# Patient Record
Sex: Female | Born: 1980 | Race: Black or African American | Hispanic: No | Marital: Married | Smoking: Never smoker
Health system: Southern US, Community
[De-identification: ages and names within clinical notes are randomized; demographics above are authoritative.]

## PROBLEM LIST (undated history)

## (undated) ENCOUNTER — Inpatient Hospital Stay (HOSPITAL_COMMUNITY): Payer: Self-pay

## (undated) DIAGNOSIS — N3010 Interstitial cystitis (chronic) without hematuria: Secondary | ICD-10-CM

## (undated) DIAGNOSIS — N390 Urinary tract infection, site not specified: Secondary | ICD-10-CM

## (undated) DIAGNOSIS — F419 Anxiety disorder, unspecified: Secondary | ICD-10-CM

## (undated) DIAGNOSIS — M797 Fibromyalgia: Secondary | ICD-10-CM

## (undated) DIAGNOSIS — G709 Myoneural disorder, unspecified: Secondary | ICD-10-CM

## (undated) DIAGNOSIS — N801 Endometriosis of ovary: Secondary | ICD-10-CM

## (undated) DIAGNOSIS — F329 Major depressive disorder, single episode, unspecified: Secondary | ICD-10-CM

## (undated) DIAGNOSIS — A599 Trichomoniasis, unspecified: Secondary | ICD-10-CM

## (undated) DIAGNOSIS — N739 Female pelvic inflammatory disease, unspecified: Secondary | ICD-10-CM

## (undated) DIAGNOSIS — K2970 Gastritis, unspecified, without bleeding: Secondary | ICD-10-CM

## (undated) DIAGNOSIS — N80109 Endometriosis of ovary, unspecified side, unspecified depth: Secondary | ICD-10-CM

## (undated) DIAGNOSIS — K219 Gastro-esophageal reflux disease without esophagitis: Secondary | ICD-10-CM

## (undated) DIAGNOSIS — F410 Panic disorder [episodic paroxysmal anxiety] without agoraphobia: Secondary | ICD-10-CM

## (undated) DIAGNOSIS — M549 Dorsalgia, unspecified: Secondary | ICD-10-CM

## (undated) DIAGNOSIS — K449 Diaphragmatic hernia without obstruction or gangrene: Secondary | ICD-10-CM

## (undated) DIAGNOSIS — F32A Depression, unspecified: Secondary | ICD-10-CM

## (undated) DIAGNOSIS — D219 Benign neoplasm of connective and other soft tissue, unspecified: Secondary | ICD-10-CM

## (undated) HISTORY — DX: Gastro-esophageal reflux disease without esophagitis: K21.9

## (undated) HISTORY — DX: Gastritis, unspecified, without bleeding: K29.70

## (undated) HISTORY — DX: Fibromyalgia: M79.7

## (undated) HISTORY — DX: Interstitial cystitis (chronic) without hematuria: N30.10

## (undated) HISTORY — DX: Myoneural disorder, unspecified: G70.9

## (undated) HISTORY — DX: Panic disorder (episodic paroxysmal anxiety): F41.0

## (undated) HISTORY — DX: Dorsalgia, unspecified: M54.9

## (undated) HISTORY — DX: Benign neoplasm of connective and other soft tissue, unspecified: D21.9

---

## 2008-12-07 ENCOUNTER — Emergency Department (HOSPITAL_COMMUNITY): Admission: EM | Admit: 2008-12-07 | Discharge: 2008-12-07 | Payer: Self-pay | Admitting: Emergency Medicine

## 2009-03-12 ENCOUNTER — Emergency Department (HOSPITAL_COMMUNITY): Admission: EM | Admit: 2009-03-12 | Discharge: 2009-03-12 | Payer: Self-pay | Admitting: Emergency Medicine

## 2009-09-17 ENCOUNTER — Emergency Department (HOSPITAL_COMMUNITY): Admission: EM | Admit: 2009-09-17 | Discharge: 2009-09-18 | Payer: Self-pay | Admitting: Emergency Medicine

## 2009-11-18 ENCOUNTER — Emergency Department (HOSPITAL_COMMUNITY): Admission: EM | Admit: 2009-11-18 | Discharge: 2009-11-18 | Payer: Self-pay | Admitting: Emergency Medicine

## 2010-03-10 ENCOUNTER — Emergency Department (HOSPITAL_COMMUNITY): Admission: EM | Admit: 2010-03-10 | Discharge: 2010-03-11 | Payer: Self-pay | Admitting: Emergency Medicine

## 2010-07-28 ENCOUNTER — Emergency Department (HOSPITAL_COMMUNITY): Admission: EM | Admit: 2010-07-28 | Discharge: 2010-07-28 | Payer: Self-pay | Admitting: Emergency Medicine

## 2010-11-07 ENCOUNTER — Emergency Department (HOSPITAL_COMMUNITY): Payer: Self-pay

## 2010-11-07 ENCOUNTER — Emergency Department (HOSPITAL_COMMUNITY)
Admission: EM | Admit: 2010-11-07 | Discharge: 2010-11-07 | Disposition: A | Discharge status: Home / Self Care | Payer: Self-pay | Attending: Emergency Medicine | Admitting: Emergency Medicine

## 2010-11-07 DIAGNOSIS — R109 Unspecified abdominal pain: Secondary | ICD-10-CM | POA: Insufficient documentation

## 2010-11-07 DIAGNOSIS — F3289 Other specified depressive episodes: Secondary | ICD-10-CM | POA: Insufficient documentation

## 2010-11-07 DIAGNOSIS — R5381 Other malaise: Secondary | ICD-10-CM | POA: Insufficient documentation

## 2010-11-07 DIAGNOSIS — F329 Major depressive disorder, single episode, unspecified: Secondary | ICD-10-CM | POA: Insufficient documentation

## 2010-11-07 LAB — HEPATIC FUNCTION PANEL
ALT: 15 U/L (ref 0–35)
Albumin: 4.2 g/dL (ref 3.5–5.2)
Alkaline Phosphatase: 53 U/L (ref 39–117)
Total Protein: 7.7 g/dL (ref 6.0–8.3)

## 2010-11-07 LAB — CBC
HCT: 38 % (ref 36.0–46.0)
Hemoglobin: 12.7 g/dL (ref 12.0–15.0)
MCH: 29 pg (ref 26.0–34.0)
MCHC: 33.4 g/dL (ref 30.0–36.0)
MCV: 86.8 fL (ref 78.0–100.0)
Platelets: 251 10*3/uL (ref 150–400)
RBC: 4.38 MIL/uL (ref 3.87–5.11)
RDW: 13.1 % (ref 11.5–15.5)
WBC: 3.3 10*3/uL — ABNORMAL LOW (ref 4.0–10.5)

## 2010-11-07 LAB — URINALYSIS, ROUTINE W REFLEX MICROSCOPIC
Bilirubin Urine: NEGATIVE
Glucose, UA: NEGATIVE mg/dL
Hgb urine dipstick: NEGATIVE
Ketones, ur: NEGATIVE mg/dL
Nitrite: NEGATIVE
Protein, ur: NEGATIVE mg/dL
Specific Gravity, Urine: 1.013 (ref 1.005–1.030)
Urobilinogen, UA: 0.2 mg/dL (ref 0.0–1.0)
pH: 6 (ref 5.0–8.0)

## 2010-11-07 LAB — BASIC METABOLIC PANEL
Chloride: 100 mEq/L (ref 96–112)
GFR calc Af Amer: >60 mL/min (ref >60)
GFR calc non Af Amer: >60 mL/min (ref >60)
Potassium: 3.8 mEq/L (ref 3.5–5.1)

## 2010-11-07 LAB — LIPASE, BLOOD: Lipase: 33 U/L (ref 11–59)

## 2010-11-07 LAB — DIFFERENTIAL
Basophils Absolute: 0 10*3/uL (ref 0.0–0.1)
Basophils Relative: 0 % (ref 0–1)
Eosinophils Absolute: 0 K/uL (ref 0.0–0.7)
Eosinophils Relative: 1 % (ref 0–5)
Lymphocytes Relative: 49 % — ABNORMAL HIGH (ref 12–46)
Lymphs Abs: 1.6 K/uL (ref 0.7–4.0)
Monocytes Absolute: 0.3 K/uL (ref 0.1–1.0)
Monocytes Relative: 8 % (ref 3–12)
Neutro Abs: 1.4 10*3/uL — ABNORMAL LOW (ref 1.7–7.7)
Neutrophils Relative %: 42 % — ABNORMAL LOW (ref 43–77)

## 2010-11-07 LAB — BASIC METABOLIC PANEL WITH GFR
BUN: 8 mg/dL (ref 6–23)
CO2: 29 meq/L (ref 19–32)
Calcium: 9.6 mg/dL (ref 8.4–10.5)
Creatinine, Ser: 0.71 mg/dL (ref 0.4–1.2)
Glucose, Bld: 94 mg/dL (ref 70–99)
Sodium: 136 meq/L (ref 135–145)

## 2010-11-09 ENCOUNTER — Inpatient Hospital Stay (INDEPENDENT_AMBULATORY_CARE_PROVIDER_SITE_OTHER)
Admission: RE | Admit: 2010-11-09 | Discharge: 2010-11-09 | Disposition: A | Discharge status: Home / Self Care | Payer: Self-pay | Source: Ambulatory Visit | Attending: Family Medicine | Admitting: Family Medicine

## 2010-11-09 DIAGNOSIS — R109 Unspecified abdominal pain: Secondary | ICD-10-CM

## 2010-11-09 DIAGNOSIS — K219 Gastro-esophageal reflux disease without esophagitis: Secondary | ICD-10-CM

## 2010-11-14 LAB — POCT PREGNANCY, URINE: Preg Test, Ur: NEGATIVE

## 2010-11-19 LAB — CBC
HCT: 31.1 % — ABNORMAL LOW (ref 36.0–46.0)
Hemoglobin: 13.3 g/dL (ref 12.0–15.0)
Hemoglobin: 10.1 g/dL — ABNORMAL LOW (ref 12.0–15.0)
MCH: 30.7 pg (ref 26.0–34.0)
MCHC: 34.1 g/dL (ref 30.0–36.0)
MCV: 80.1 fL (ref 78.0–100.0)
Platelets: 242 10*3/uL (ref 150–400)
RDW: 13.9 % (ref 11.5–15.5)
RDW: 16.7 % — ABNORMAL HIGH (ref 11.5–15.5)

## 2010-11-19 LAB — URINALYSIS, ROUTINE W REFLEX MICROSCOPIC
Glucose, UA: NEGATIVE mg/dL
Glucose, UA: NEGATIVE mg/dL
Ketones, ur: NEGATIVE mg/dL
Nitrite: NEGATIVE
Protein, ur: NEGATIVE mg/dL
Specific Gravity, Urine: 1.025 (ref 1.005–1.030)
Urobilinogen, UA: 0.2 mg/dL (ref 0.0–1.0)
pH: 6.5 (ref 5.0–8.0)

## 2010-11-19 LAB — DIFFERENTIAL
Basophils Absolute: 0 10*3/uL (ref 0.0–0.1)
Eosinophils Absolute: 0.1 10*3/uL (ref 0.0–0.7)
Eosinophils Absolute: 0 10*3/uL (ref 0.0–0.7)
Eosinophils Relative: 1 % (ref 0–5)
Eosinophils Relative: 1 % (ref 0–5)
Lymphocytes Relative: 47 % — ABNORMAL HIGH (ref 12–46)
Lymphocytes Relative: 54 % — ABNORMAL HIGH (ref 12–46)
Lymphs Abs: 2 10*3/uL (ref 0.7–4.0)
Monocytes Absolute: 0.4 10*3/uL (ref 0.1–1.0)
Monocytes Absolute: 0.3 10*3/uL (ref 0.1–1.0)
Monocytes Relative: 9 % (ref 3–12)

## 2010-11-19 LAB — URINE MICROSCOPIC-ADD ON

## 2010-11-19 LAB — POCT PREGNANCY, URINE: Preg Test, Ur: NEGATIVE

## 2010-11-19 LAB — COMPREHENSIVE METABOLIC PANEL
ALT: 18 U/L (ref 0–35)
AST: 19 U/L (ref 0–37)
Albumin: 4 g/dL (ref 3.5–5.2)
Calcium: 8.8 mg/dL (ref 8.4–10.5)
Creatinine, Ser: 0.71 mg/dL (ref 0.4–1.2)
GFR calc Af Amer: >60 mL/min (ref >60)
GFR calc non Af Amer: >60 mL/min (ref >60)
Sodium: 135 mEq/L (ref 135–145)
Total Protein: 7.4 g/dL (ref 6.0–8.3)

## 2010-11-19 LAB — BASIC METABOLIC PANEL
Calcium: 8.7 mg/dL (ref 8.4–10.5)
GFR calc Af Amer: >60 mL/min (ref >60)
GFR calc non Af Amer: >60 mL/min (ref >60)
Glucose, Bld: 94 mg/dL (ref 70–99)
Sodium: 134 mEq/L — ABNORMAL LOW (ref 135–145)

## 2010-11-19 LAB — LIPASE, BLOOD: Lipase: 35 U/L (ref 11–59)

## 2010-12-10 LAB — URINALYSIS, ROUTINE W REFLEX MICROSCOPIC
Ketones, ur: 15 mg/dL — AB
Nitrite: NEGATIVE
Protein, ur: NEGATIVE mg/dL

## 2010-12-10 LAB — GC/CHLAMYDIA PROBE AMP, GENITAL: GC Probe Amp, Genital: NEGATIVE

## 2010-12-10 LAB — URINE CULTURE
Colony Count: NO GROWTH
Culture: NO GROWTH

## 2010-12-10 LAB — DIFFERENTIAL
Basophils Absolute: 0 10*3/uL (ref 0.0–0.1)
Eosinophils Absolute: 0 10*3/uL (ref 0.0–0.7)
Eosinophils Relative: 1 % (ref 0–5)
Lymphocytes Relative: 32 % (ref 12–46)

## 2010-12-10 LAB — CBC
HCT: 30.8 % — ABNORMAL LOW (ref 36.0–46.0)
MCV: 78.1 fL (ref 78.0–100.0)
Platelets: 288 10*3/uL (ref 150–400)
RDW: 16.6 % — ABNORMAL HIGH (ref 11.5–15.5)

## 2010-12-10 LAB — PREGNANCY, URINE: Preg Test, Ur: NEGATIVE

## 2010-12-10 LAB — RPR: RPR Ser Ql: NONREACTIVE

## 2010-12-10 LAB — WET PREP, GENITAL: Yeast Wet Prep HPF POC: NONE SEEN

## 2010-12-13 LAB — URINE MICROSCOPIC-ADD ON

## 2010-12-13 LAB — URINALYSIS, ROUTINE W REFLEX MICROSCOPIC
Bilirubin Urine: NEGATIVE
Nitrite: NEGATIVE
Specific Gravity, Urine: 1.008 (ref 1.005–1.030)
pH: 7.5 (ref 5.0–8.0)

## 2011-01-15 ENCOUNTER — Emergency Department (HOSPITAL_COMMUNITY)
Admission: EM | Admit: 2011-01-15 | Discharge: 2011-01-15 | Disposition: A | Discharge status: Home / Self Care | Payer: Self-pay | Attending: Emergency Medicine | Admitting: Emergency Medicine

## 2011-01-15 ENCOUNTER — Emergency Department (HOSPITAL_COMMUNITY): Payer: Self-pay

## 2011-01-15 DIAGNOSIS — H538 Other visual disturbances: Secondary | ICD-10-CM | POA: Insufficient documentation

## 2011-01-15 DIAGNOSIS — R209 Unspecified disturbances of skin sensation: Secondary | ICD-10-CM | POA: Insufficient documentation

## 2011-01-15 DIAGNOSIS — IMO0001 Reserved for inherently not codable concepts without codable children: Secondary | ICD-10-CM | POA: Insufficient documentation

## 2011-01-15 DIAGNOSIS — F329 Major depressive disorder, single episode, unspecified: Secondary | ICD-10-CM | POA: Insufficient documentation

## 2011-01-15 DIAGNOSIS — R29898 Other symptoms and signs involving the musculoskeletal system: Secondary | ICD-10-CM | POA: Insufficient documentation

## 2011-01-15 DIAGNOSIS — R5383 Other fatigue: Secondary | ICD-10-CM | POA: Insufficient documentation

## 2011-01-15 DIAGNOSIS — F3289 Other specified depressive episodes: Secondary | ICD-10-CM | POA: Insufficient documentation

## 2011-01-15 DIAGNOSIS — R079 Chest pain, unspecified: Secondary | ICD-10-CM | POA: Insufficient documentation

## 2011-01-15 DIAGNOSIS — G43909 Migraine, unspecified, not intractable, without status migrainosus: Secondary | ICD-10-CM | POA: Insufficient documentation

## 2011-01-15 DIAGNOSIS — R42 Dizziness and giddiness: Secondary | ICD-10-CM | POA: Insufficient documentation

## 2011-01-15 DIAGNOSIS — R5381 Other malaise: Secondary | ICD-10-CM | POA: Insufficient documentation

## 2011-01-15 DIAGNOSIS — H53149 Visual discomfort, unspecified: Secondary | ICD-10-CM | POA: Insufficient documentation

## 2011-01-15 DIAGNOSIS — R110 Nausea: Secondary | ICD-10-CM | POA: Insufficient documentation

## 2011-01-15 DIAGNOSIS — K219 Gastro-esophageal reflux disease without esophagitis: Secondary | ICD-10-CM | POA: Insufficient documentation

## 2011-01-15 LAB — POCT PREGNANCY, URINE: Preg Test, Ur: NEGATIVE

## 2011-01-15 LAB — URINALYSIS, ROUTINE W REFLEX MICROSCOPIC
Glucose, UA: NEGATIVE mg/dL
Ketones, ur: NEGATIVE mg/dL
Leukocytes, UA: NEGATIVE
Protein, ur: NEGATIVE mg/dL

## 2011-01-19 ENCOUNTER — Inpatient Hospital Stay (HOSPITAL_COMMUNITY): Payer: Self-pay

## 2011-01-19 ENCOUNTER — Inpatient Hospital Stay (HOSPITAL_COMMUNITY)
Admission: AD | Admit: 2011-01-19 | Discharge: 2011-01-19 | Disposition: A | Discharge status: Home / Self Care | Payer: Self-pay | Source: Ambulatory Visit | Attending: Obstetrics & Gynecology | Admitting: Obstetrics & Gynecology

## 2011-01-19 DIAGNOSIS — R109 Unspecified abdominal pain: Secondary | ICD-10-CM | POA: Insufficient documentation

## 2011-01-19 DIAGNOSIS — D259 Leiomyoma of uterus, unspecified: Secondary | ICD-10-CM

## 2011-01-19 DIAGNOSIS — N949 Unspecified condition associated with female genital organs and menstrual cycle: Secondary | ICD-10-CM

## 2011-01-19 DIAGNOSIS — N938 Other specified abnormal uterine and vaginal bleeding: Secondary | ICD-10-CM | POA: Insufficient documentation

## 2011-01-19 DIAGNOSIS — R102 Pelvic and perineal pain: Secondary | ICD-10-CM

## 2011-01-19 LAB — URINE MICROSCOPIC-ADD ON

## 2011-01-19 LAB — URINALYSIS, ROUTINE W REFLEX MICROSCOPIC
Bilirubin Urine: NEGATIVE
Leukocytes, UA: NEGATIVE
Nitrite: NEGATIVE
Specific Gravity, Urine: >1.03 — ABNORMAL HIGH (ref 1.005–1.030)
pH: 5.5 (ref 5.0–8.0)

## 2011-01-19 LAB — WET PREP, GENITAL: Clue Cells Wet Prep HPF POC: NONE SEEN

## 2011-01-19 LAB — CBC
MCV: 87.4 fL (ref 78.0–100.0)
Platelets: 258 10*3/uL (ref 150–400)
RBC: 4.13 MIL/uL (ref 3.87–5.11)
WBC: 3.4 10*3/uL — ABNORMAL LOW (ref 4.0–10.5)

## 2011-01-19 LAB — POCT PREGNANCY, URINE: Preg Test, Ur: NEGATIVE

## 2011-01-21 ENCOUNTER — Inpatient Hospital Stay (HOSPITAL_COMMUNITY)
Admission: AD | Admit: 2011-01-21 | Discharge: 2011-01-22 | Disposition: A | Discharge status: Home / Self Care | Payer: Self-pay | Source: Ambulatory Visit | Attending: Obstetrics & Gynecology | Admitting: Obstetrics & Gynecology

## 2011-01-21 DIAGNOSIS — R109 Unspecified abdominal pain: Secondary | ICD-10-CM | POA: Insufficient documentation

## 2011-01-21 DIAGNOSIS — D259 Leiomyoma of uterus, unspecified: Secondary | ICD-10-CM

## 2011-01-22 ENCOUNTER — Encounter: Payer: Self-pay | Admitting: Advanced Practice Midwife

## 2011-01-22 ENCOUNTER — Encounter: Payer: Self-pay | Admitting: Obstetrics & Gynecology

## 2011-01-22 ENCOUNTER — Encounter: Payer: Self-pay | Admitting: Physician Assistant

## 2011-01-22 ENCOUNTER — Inpatient Hospital Stay (HOSPITAL_COMMUNITY): Payer: Self-pay

## 2011-01-23 ENCOUNTER — Other Ambulatory Visit: Payer: Self-pay | Admitting: Obstetrics & Gynecology

## 2011-02-23 ENCOUNTER — Encounter: Payer: Self-pay | Admitting: Advanced Practice Midwife

## 2011-03-08 ENCOUNTER — Other Ambulatory Visit: Payer: Self-pay | Admitting: Obstetrics and Gynecology

## 2011-03-08 ENCOUNTER — Ambulatory Visit (INDEPENDENT_AMBULATORY_CARE_PROVIDER_SITE_OTHER): Payer: Self-pay | Admitting: Obstetrics and Gynecology

## 2011-03-08 DIAGNOSIS — N949 Unspecified condition associated with female genital organs and menstrual cycle: Secondary | ICD-10-CM

## 2011-03-08 DIAGNOSIS — R140 Abdominal distension (gaseous): Secondary | ICD-10-CM

## 2011-03-08 DIAGNOSIS — R1084 Generalized abdominal pain: Secondary | ICD-10-CM

## 2011-03-09 ENCOUNTER — Ambulatory Visit (HOSPITAL_COMMUNITY)
Admission: RE | Admit: 2011-03-09 | Discharge: 2011-03-09 | Disposition: A | Discharge status: Home / Self Care | Payer: Self-pay | Source: Ambulatory Visit | Attending: Obstetrics and Gynecology | Admitting: Obstetrics and Gynecology

## 2011-03-09 DIAGNOSIS — K429 Umbilical hernia without obstruction or gangrene: Secondary | ICD-10-CM | POA: Insufficient documentation

## 2011-03-09 DIAGNOSIS — R141 Gas pain: Secondary | ICD-10-CM | POA: Insufficient documentation

## 2011-03-09 DIAGNOSIS — R109 Unspecified abdominal pain: Secondary | ICD-10-CM | POA: Insufficient documentation

## 2011-03-09 DIAGNOSIS — R142 Eructation: Secondary | ICD-10-CM | POA: Insufficient documentation

## 2011-03-09 DIAGNOSIS — R140 Abdominal distension (gaseous): Secondary | ICD-10-CM

## 2011-03-09 LAB — HCG, SERUM, QUALITATIVE: Preg, Serum: NEGATIVE

## 2011-03-09 MED ORDER — IOHEXOL 300 MG/ML  SOLN
100.0000 mL | Freq: Once | INTRAMUSCULAR | Status: AC | PRN
  Administered 2011-03-09: 100 mL via INTRAVENOUS

## 2011-03-09 NOTE — Group Therapy Note (Signed)
NAMELATOYNA, HIRD NO.:  [id number]  MEDICAL RECORD NO.:  [id number]           PATIENT TYPE:  A  LOCATION:  [facility]                   FACILITY:  [facility]  PHYSICIAN:  Argentina Donovan, MD        DATE OF BIRTH:  23-Mar-1981  DATE OF SERVICE:  03/08/2011                                 CLINIC NOTE  CHIEF COMPLAINT:  Pelvic pain.  The patient is a 30 year old, nulligravida, African American female, married, who in the past has used Depo-Provera and birth control pills. She stopped all of this approximately 8 months ago because of chronic migraines.  Since then, she has had migraines almost daily with visual aura, but her chief complaint to [place] has been that she has severe dyspareunia as well as pelvic pain.  She is up 5 or 6 times at night to urinate.  She had seen a GI doctor 2 years ago and was diagnosed with GERD and gastritis.  She has a bowel movement every day and sometimes 3 or 4 she said.  Her abdomen she says is always bloated.  Her pelvic ultrasound was completely normal with exception of a 1-cm anterior fibroid.  PHYSICAL EXAMINATION:  ABDOMEN:  On examination, her abdomen is somewhat obtunded, but tympanic, nontender.  No guarding.  No rebound.  No organomegaly. BIMANUAL GYN EXAM:  External genitalia is normal.  BUS is within normal limits.  Vagina is clean and well rugated.  Cervix is clean and nulliparous.  The uterus is anterior, normal size, shape, consistency. The adnexa could not be well outlined because of slight vaginismus, however, the anterior vaginal wall was extremely tender to palpation and the patient definitely flinched when pressure was applied here.  IMPRESSION:  My impression is that she probably has interstitial cystitis.  Because of her dysmenorrhea, also I cannot rule out the possibility of endometriosis.  She has never been treated for either.  I am going to start her on some Elavil to see whether that would be of any help and get a  referral to a urologist for evaluation and possible treatment with Elmiron, then and whenever come back and see one of our surgeons, who I think she deserves probably a laparoscopy to rule out any endometriosis.  The impression is chronic pelvic pain, probable interstitial cystitis, rule out endometriosis, gastrointestinal bloating.          ______________________________ Argentina Donovan, MD    PR/MEDQ  D:  03/08/2011  T:  03/09/2011  Job:  [id number]

## 2011-04-02 ENCOUNTER — Telehealth: Payer: Self-pay

## 2011-04-02 NOTE — Telephone Encounter (Signed)
Pt informed me that she has been taking Elvaril since 02/07/11 and she is now having numbness and tingling in her feet.  I informed pt that she needs to stop taking med today and that she can take ibuprofen for pain until she comes in Sept 6th to see a provider.  Pt stated understanding.

## 2011-04-04 ENCOUNTER — Encounter: Payer: Self-pay | Admitting: *Deleted

## 2011-04-04 DIAGNOSIS — R102 Pelvic and perineal pain: Secondary | ICD-10-CM

## 2011-04-04 DIAGNOSIS — N3010 Interstitial cystitis (chronic) without hematuria: Secondary | ICD-10-CM | POA: Insufficient documentation

## 2011-04-04 DIAGNOSIS — N946 Dysmenorrhea, unspecified: Secondary | ICD-10-CM | POA: Insufficient documentation

## 2011-04-04 DIAGNOSIS — G8929 Other chronic pain: Secondary | ICD-10-CM

## 2011-04-04 DIAGNOSIS — K589 Irritable bowel syndrome without diarrhea: Secondary | ICD-10-CM | POA: Insufficient documentation

## 2011-04-05 ENCOUNTER — Emergency Department (HOSPITAL_COMMUNITY)
Admission: EM | Admit: 2011-04-05 | Discharge: 2011-04-05 | Disposition: A | Discharge status: Home / Self Care | Payer: Self-pay | Attending: Emergency Medicine | Admitting: Emergency Medicine

## 2011-04-05 DIAGNOSIS — R05 Cough: Secondary | ICD-10-CM | POA: Insufficient documentation

## 2011-04-05 DIAGNOSIS — F329 Major depressive disorder, single episode, unspecified: Secondary | ICD-10-CM | POA: Insufficient documentation

## 2011-04-05 DIAGNOSIS — K589 Irritable bowel syndrome without diarrhea: Secondary | ICD-10-CM | POA: Insufficient documentation

## 2011-04-05 DIAGNOSIS — F3289 Other specified depressive episodes: Secondary | ICD-10-CM | POA: Insufficient documentation

## 2011-04-05 DIAGNOSIS — M549 Dorsalgia, unspecified: Secondary | ICD-10-CM | POA: Insufficient documentation

## 2011-04-05 DIAGNOSIS — R109 Unspecified abdominal pain: Secondary | ICD-10-CM | POA: Insufficient documentation

## 2011-04-05 DIAGNOSIS — K219 Gastro-esophageal reflux disease without esophagitis: Secondary | ICD-10-CM | POA: Insufficient documentation

## 2011-04-05 DIAGNOSIS — R079 Chest pain, unspecified: Secondary | ICD-10-CM | POA: Insufficient documentation

## 2011-04-05 DIAGNOSIS — R110 Nausea: Secondary | ICD-10-CM | POA: Insufficient documentation

## 2011-04-05 DIAGNOSIS — J40 Bronchitis, not specified as acute or chronic: Secondary | ICD-10-CM | POA: Insufficient documentation

## 2011-04-05 DIAGNOSIS — R000 Tachycardia, unspecified: Secondary | ICD-10-CM | POA: Insufficient documentation

## 2011-04-05 DIAGNOSIS — R059 Cough, unspecified: Secondary | ICD-10-CM | POA: Insufficient documentation

## 2011-04-18 ENCOUNTER — Ambulatory Visit: Payer: Self-pay | Admitting: Obstetrics and Gynecology

## 2011-05-10 ENCOUNTER — Encounter: Payer: Self-pay | Admitting: Obstetrics & Gynecology

## 2011-05-10 ENCOUNTER — Ambulatory Visit (INDEPENDENT_AMBULATORY_CARE_PROVIDER_SITE_OTHER): Payer: Self-pay | Admitting: Obstetrics & Gynecology

## 2011-05-10 VITALS — BP 121/78 | HR 101 | Temp 97.8°F | Ht 66.5 in | Wt 149.3 lb

## 2011-05-10 DIAGNOSIS — N949 Unspecified condition associated with female genital organs and menstrual cycle: Secondary | ICD-10-CM

## 2011-05-10 DIAGNOSIS — R102 Pelvic and perineal pain: Secondary | ICD-10-CM

## 2011-05-10 NOTE — Progress Notes (Signed)
  Subjective:    Patient ID: Meredith Howard, female    DOB: 1981-02-19, 30 y.o.   MRN: [id number]  HPI  Meredith Howard is a 30 yo AA G0 who has a long history of pelvic pain, dysparunia, and dysmenorrhea. Her ultrasound is essentially normal (1 cm fibroid). She has fibromyalgia, GERD, anxiety, chronic back pain. She and her husband would like a pregnancy and are currently not using birth control.  She tries to remember to take her PNVs.  Review of Systems     Objective:   Physical Exam    Bimanual exam is not very informative.  Her uterus feels to be in the midplane position and of normal size, but it is limited by her voluntary guarding.  (She tells me that during pelvic exams doctors always mention how tight her abd muscles are.)    Assessment & Plan:  Chronic pelvic pain, dysmenorrhea- I agree with Dr. Okey Dupre that she would benefit from a laparoscopy.  I have recommended that she use condoms until after surgery.

## 2011-06-04 HISTORY — PX: LAPAROSCOPIC ENDOMETRIOSIS FULGURATION: SUR769

## 2011-06-11 ENCOUNTER — Encounter (HOSPITAL_COMMUNITY): Payer: Self-pay

## 2011-06-11 ENCOUNTER — Encounter (HOSPITAL_COMMUNITY)
Admission: RE | Admit: 2011-06-11 | Discharge: 2011-06-11 | Disposition: A | Discharge status: Home / Self Care | Payer: Self-pay | Source: Ambulatory Visit | Attending: Obstetrics & Gynecology | Admitting: Obstetrics & Gynecology

## 2011-06-11 HISTORY — DX: Anxiety disorder, unspecified: F41.9

## 2011-06-11 LAB — CBC
HCT: 35.4 % — ABNORMAL LOW (ref 36.0–46.0)
MCH: 27.7 pg (ref 26.0–34.0)
MCHC: 31.9 g/dL (ref 30.0–36.0)
MCV: 86.8 fL (ref 78.0–100.0)
RDW: 14.1 % (ref 11.5–15.5)

## 2011-06-11 NOTE — Pre-Procedure Instructions (Deleted)
   Your procedure is scheduled on: Monday Octobler 15th  Enter through the Main Entrance of [facility] at: 8am Pick up the phone at the desk and dial [phone] and inform us of your arrival  Please call this number if you have any problems the morning of surgery: [phone]  Remember: Do not eat food after midnight  Do not drink clear liquids after:midnight Take these medicines the morning of surgery with a SIP OF WATER:  Do not wear jewelry, make-up, or FINGER nail polish Do not wear lotions, powders, or perfumes.  Do not shave 48 hours prior to surgery. Do not bring valuables to the hospital. Leave suitcase in the car. After Surgery it may be brought to your room. For patients being admitted to the hospital, checkout time is 11:00am the day of discharge.  Patients discharged on the day of surgery will not be allowed to drive home.   Name and phone number of your driver:   Remember to use your hibiclens as instructed.Please shower with 1/2 bottle the evening before your surgery and the other 1/2 bottle the morning of surgery.

## 2011-06-11 NOTE — Patient Instructions (Addendum)
   Your procedure is scheduled on: Monday October 15th  Enter through the Main Entrance of [facility] at: 8am Pick up the phone at the desk and dial [phone] and inform us of your arrival  Please call this number if you have any problems the morning of surgery: [phone]  Remember: Do not eat food after midnight  Do not drink clear liquids after:midnight Take these medicines the morning of surgery with a SIP OF WATER: Zantac  Do not wear jewelry, make-up, or FINGER nail polish Do not wear lotions, powders, or perfumes.  Do not shave 48 hours prior to surgery. Do not bring valuables to the hospital.  Patients discharged on the day of surgery will not be allowed to drive home.   Name and phone number of your driver: Husband-Phillip [phone]   Remember to use your hibiclens as instructed.Please shower with 1/2 bottle the evening before your surgery and the other 1/2 bottle the morning of surgery.

## 2011-06-18 ENCOUNTER — Ambulatory Visit (HOSPITAL_COMMUNITY)
Admission: RE | Admit: 2011-06-18 | Discharge: 2011-06-18 | Disposition: A | Discharge status: Home / Self Care | Payer: Self-pay | Source: Ambulatory Visit | Attending: Obstetrics & Gynecology | Admitting: Obstetrics & Gynecology

## 2011-06-18 ENCOUNTER — Encounter (HOSPITAL_COMMUNITY): Payer: Self-pay

## 2011-06-18 ENCOUNTER — Encounter (HOSPITAL_COMMUNITY)
Admission: RE | Disposition: A | Discharge status: Home / Self Care | Payer: Self-pay | Source: Ambulatory Visit | Attending: Obstetrics & Gynecology

## 2011-06-18 ENCOUNTER — Ambulatory Visit (HOSPITAL_COMMUNITY): Payer: Self-pay

## 2011-06-18 ENCOUNTER — Other Ambulatory Visit: Payer: Self-pay | Admitting: Obstetrics & Gynecology

## 2011-06-18 DIAGNOSIS — Z01812 Encounter for preprocedural laboratory examination: Secondary | ICD-10-CM | POA: Insufficient documentation

## 2011-06-18 DIAGNOSIS — N949 Unspecified condition associated with female genital organs and menstrual cycle: Secondary | ICD-10-CM | POA: Insufficient documentation

## 2011-06-18 DIAGNOSIS — N803 Endometriosis of pelvic peritoneum, unspecified: Secondary | ICD-10-CM | POA: Insufficient documentation

## 2011-06-18 DIAGNOSIS — Z01818 Encounter for other preprocedural examination: Secondary | ICD-10-CM | POA: Insufficient documentation

## 2011-06-18 HISTORY — PX: LAPAROSCOPY: SHX197

## 2011-06-18 SURGERY — LAPAROSCOPY, DIAGNOSTIC
Anesthesia: Choice | Site: Abdomen | Laterality: Bilateral

## 2011-06-18 MED ORDER — FENTANYL CITRATE 0.05 MG/ML IJ SOLN
INTRAMUSCULAR | Status: AC
  Administered 2011-06-18: 50 ug via INTRAVENOUS
  Filled 2011-06-18: qty 2

## 2011-06-18 MED ORDER — HYDROCODONE-ACETAMINOPHEN 5-500 MG PO TABS: 1.0000 | ORAL_TABLET | Freq: Four times a day (QID) | ORAL | Status: AC | PRN

## 2011-06-18 MED ORDER — MIDAZOLAM HCL 2 MG/2ML IJ SOLN
INTRAMUSCULAR | Status: AC
  Filled 2011-06-18: qty 2

## 2011-06-18 MED ORDER — GLYCOPYRROLATE 0.2 MG/ML IJ SOLN
INTRAMUSCULAR | Status: DC | PRN
  Administered 2011-06-18: .8 mg via INTRAVENOUS

## 2011-06-18 MED ORDER — KETOROLAC TROMETHAMINE 30 MG/ML IJ SOLN
INTRAMUSCULAR | Status: DC | PRN
  Administered 2011-06-18: 30 mg via INTRAVENOUS

## 2011-06-18 MED ORDER — LACTATED RINGERS IV SOLN
INTRAVENOUS | Status: DC
  Administered 2011-06-18 (×2): via INTRAVENOUS

## 2011-06-18 MED ORDER — ROCURONIUM BROMIDE 100 MG/10ML IV SOLN
INTRAVENOUS | Status: DC | PRN
  Administered 2011-06-18: 20 mg via INTRAVENOUS

## 2011-06-18 MED ORDER — KETOROLAC TROMETHAMINE 30 MG/ML IJ SOLN
INTRAMUSCULAR | Status: AC
  Filled 2011-06-18: qty 1

## 2011-06-18 MED ORDER — NEOSTIGMINE METHYLSULFATE 1 MG/ML IJ SOLN
INTRAMUSCULAR | Status: DC | PRN
  Administered 2011-06-18: 4 mg via INTRAVENOUS

## 2011-06-18 MED ORDER — DEXAMETHASONE SODIUM PHOSPHATE 10 MG/ML IJ SOLN
INTRAMUSCULAR | Status: AC
  Filled 2011-06-18: qty 1

## 2011-06-18 MED ORDER — PROPOFOL 10 MG/ML IV EMUL
INTRAVENOUS | Status: DC | PRN
  Administered 2011-06-18: 200 mg via INTRAVENOUS

## 2011-06-18 MED ORDER — ONDANSETRON HCL 4 MG/2ML IJ SOLN
INTRAMUSCULAR | Status: AC
  Filled 2011-06-18: qty 2

## 2011-06-18 MED ORDER — PROPOFOL 10 MG/ML IV EMUL
INTRAVENOUS | Status: AC
  Filled 2011-06-18: qty 20

## 2011-06-18 MED ORDER — LIDOCAINE HCL (CARDIAC) 20 MG/ML IV SOLN
INTRAVENOUS | Status: AC
  Filled 2011-06-18: qty 5

## 2011-06-18 MED ORDER — LIDOCAINE HCL (CARDIAC) 20 MG/ML IV SOLN
INTRAVENOUS | Status: DC | PRN
  Administered 2011-06-18: 20 mg via INTRAVENOUS

## 2011-06-18 MED ORDER — GLYCOPYRROLATE 0.2 MG/ML IJ SOLN
INTRAMUSCULAR | Status: AC
  Filled 2011-06-18: qty 3

## 2011-06-18 MED ORDER — DEXTROSE IN LACTATED RINGERS 5 % IV SOLN: INTRAVENOUS | Status: DC

## 2011-06-18 MED ORDER — FENTANYL CITRATE 0.05 MG/ML IJ SOLN
25.0000 ug | INTRAMUSCULAR | Status: DC | PRN
  Administered 2011-06-18: 50 ug via INTRAVENOUS

## 2011-06-18 MED ORDER — MIDAZOLAM HCL 5 MG/5ML IJ SOLN
INTRAMUSCULAR | Status: DC | PRN
  Administered 2011-06-18: 2 mg via INTRAVENOUS

## 2011-06-18 MED ORDER — BUPIVACAINE HCL (PF) 0.5 % IJ SOLN
INTRAMUSCULAR | Status: DC | PRN
  Administered 2011-06-18: 10 mL

## 2011-06-18 MED ORDER — DEXAMETHASONE SODIUM PHOSPHATE 4 MG/ML IJ SOLN
INTRAMUSCULAR | Status: DC | PRN
  Administered 2011-06-18: 10 mg via INTRAVENOUS

## 2011-06-18 MED ORDER — SUCCINYLCHOLINE CHLORIDE 20 MG/ML IJ SOLN
INTRAMUSCULAR | Status: DC | PRN
  Administered 2011-06-18: 100 mg via INTRAVENOUS

## 2011-06-18 MED ORDER — NEOSTIGMINE METHYLSULFATE 1 MG/ML IJ SOLN
INTRAMUSCULAR | Status: AC
  Filled 2011-06-18: qty 10

## 2011-06-18 MED ORDER — FENTANYL CITRATE 0.05 MG/ML IJ SOLN
INTRAMUSCULAR | Status: AC
  Filled 2011-06-18: qty 5

## 2011-06-18 MED ORDER — PANTOPRAZOLE SODIUM 40 MG PO TBEC
DELAYED_RELEASE_TABLET | ORAL | Status: AC
  Administered 2011-06-18: 40 mg via ORAL
  Filled 2011-06-18: qty 1

## 2011-06-18 MED ORDER — ROCURONIUM BROMIDE 50 MG/5ML IV SOLN
INTRAVENOUS | Status: AC
  Filled 2011-06-18: qty 1

## 2011-06-18 MED ORDER — ACETAMINOPHEN 325 MG PO TABS: 325.0000 mg | ORAL_TABLET | ORAL | Status: DC | PRN

## 2011-06-18 MED ORDER — PANTOPRAZOLE SODIUM 40 MG PO TBEC
40.0000 mg | DELAYED_RELEASE_TABLET | Freq: Once | ORAL | Status: AC
  Administered 2011-06-18: 40 mg via ORAL

## 2011-06-18 MED ORDER — ONDANSETRON HCL 4 MG/2ML IJ SOLN
INTRAMUSCULAR | Status: DC | PRN
  Administered 2011-06-18: 4 mg via INTRAVENOUS

## 2011-06-18 MED ORDER — FENTANYL CITRATE 0.05 MG/ML IJ SOLN
INTRAMUSCULAR | Status: DC | PRN
  Administered 2011-06-18 (×4): 50 ug via INTRAVENOUS

## 2011-06-18 MED ORDER — SUCCINYLCHOLINE CHLORIDE 20 MG/ML IJ SOLN
INTRAMUSCULAR | Status: AC
  Filled 2011-06-18: qty 1

## 2011-06-18 MED ORDER — PROMETHAZINE HCL 25 MG/ML IJ SOLN: 6.2500 mg | INTRAMUSCULAR | Status: DC | PRN

## 2011-06-18 SURGICAL SUPPLY — 25 items
APPLICATOR COTTON TIP 6IN STRL (MISCELLANEOUS) ×2 IMPLANT
BENZOIN TINCTURE PRP APPL 2/3 (GAUZE/BANDAGES/DRESSINGS) ×2 IMPLANT
CABLE HIGH FREQUENCY MONO STRZ (ELECTRODE) IMPLANT
CATH ROBINSON RED A/P 16FR (CATHETERS) ×2 IMPLANT
CHLORAPREP W/TINT 26ML (MISCELLANEOUS) ×2 IMPLANT
CLOTH BEACON ORANGE TIMEOUT ST (SAFETY) ×2 IMPLANT
ELECT REM PT RETURN 9FT ADLT (ELECTROSURGICAL)
ELECTRODE REM PT RTRN 9FT ADLT (ELECTROSURGICAL) IMPLANT
GLOVE BIO SURGEON STRL SZ 6.5 (GLOVE) ×4 IMPLANT
GOWN PREVENTION PLUS LG XLONG (DISPOSABLE) ×4 IMPLANT
NDL SAFETY ECLIPSE 18X1.5 (NEEDLE) ×1 IMPLANT
NEEDLE HYPO 18GX1.5 SHARP (NEEDLE) ×1
NEEDLE INSUFFLATION 14GA 120MM (NEEDLE) ×2 IMPLANT
NS IRRIG 1000ML POUR BTL (IV SOLUTION) ×2 IMPLANT
PACK LAPAROSCOPY BASIN (CUSTOM PROCEDURE TRAY) ×2 IMPLANT
POUCH SPECIMEN RETRIEVAL 10MM (ENDOMECHANICALS) IMPLANT
SET IRRIG TUBING LAPAROSCOPIC (IRRIGATION / IRRIGATOR) IMPLANT
STRIP CLOSURE SKIN 1/4X4 (GAUZE/BANDAGES/DRESSINGS) ×2 IMPLANT
SUT VICRYL 0 ENDOLOOP (SUTURE) IMPLANT
SUT VICRYL 0 UR6 27IN ABS (SUTURE) ×4 IMPLANT
SUT VICRYL 4-0 PS2 18IN ABS (SUTURE) ×4 IMPLANT
TOWEL OR 17X24 6PK STRL BLUE (TOWEL DISPOSABLE) ×4 IMPLANT
TROCAR XCEL NON-BLD 11X100MML (ENDOMECHANICALS) ×2 IMPLANT
TROCAR XCEL NON-BLD 5MMX100MML (ENDOMECHANICALS) ×2 IMPLANT
WATER STERILE IRR 1000ML POUR (IV SOLUTION) ×2 IMPLANT

## 2011-06-18 NOTE — Anesthesia Preprocedure Evaluation (Signed)
Anesthesia Evaluation  Name, MR# and DOB Patient awake  General Assessment Comment  Reviewed: Allergy & Precautions, H&P , Patient's Chart, lab work & pertinent test results, reviewed documented beta blocker date and time   History of Anesthesia Complications Negative for: history of anesthetic complications  Airway Mallampati: II TM Distance: >3 FB Neck ROM: full    Dental No notable dental hx.    Pulmonary  clear to auscultation  Pulmonary exam normal       Cardiovascular Exercise Tolerance: Good regular Normal    Neuro/Psych  Headaches,  Neuromuscular disease Negative Neurological ROS  Negative Psych ROS   GI/Hepatic negative GI ROS Neg liver ROS  GERD   Endo/Other  Negative Endocrine ROS  Renal/GU negative Renal ROS     Musculoskeletal   Abdominal   Peds  Hematology negative hematology ROS (+)   Anesthesia Other Findings   Reproductive/Obstetrics negative OB ROS                           Anesthesia Physical Anesthesia Plan  ASA: II  Anesthesia Plan: General   Post-op Pain Management:    Induction:   Airway Management Planned:   Additional Equipment:   Intra-op Plan:   Post-operative Plan:   Informed Consent: I have reviewed the patients History and Physical, chart, labs and discussed the procedure including the risks, benefits and alternatives for the proposed anesthesia with the patient or authorized representative who has indicated his/her understanding and acceptance.   Dental Advisory Given  Plan Discussed with: CRNA and Surgeon  Anesthesia Plan Comments:         Anesthesia Quick Evaluation

## 2011-06-18 NOTE — H&P (Addendum)
Meredith Howard is an 30 y.o. M AA G0. She has a history of chronic pelvic pain, for last 5 years, worsening over the last few month. U/S showed a 1 cm fibroid. Pain is daily, but much worse with periods.  Has taken Vicodin and Percocet which relieves the pain.  IBU 800 mg also helps some. She has dysparunia for "as long as I can remember".  Pertinent Gynecological History: Menses: flow is excessive with use of 6-10 pads or tampons on heaviest days Bleeding: intermenstrual bleeding Contraception: none DES exposure: unknown Blood transfusions: none Sexually transmitted diseases: no past history Previous GYN Procedures: none  Last mammogram: n/a Date:  Last pap: normal Date:  OB History: G0, P0  Menstrual History: Patient's last menstrual period was 05/24/2011.    Past Medical History  Diagnosis Date  . GERD (gastroesophageal reflux disease)   . Neuromuscular disorder     fibromyalagia  . Migraine   . Back pain   . Gastritis   . Interstitial cystitis   . Anxiety     pt states she experiences panic attacks    Past Surgical History  Procedure Date  . No past surgeries     Family History  Problem Relation Age of Onset  . Fibroids Mother   . Hypertension Mother   . Hypertension Father   . Diabetes Father   . Kidney disease Father     kidney stones  . Gout Father     Social History:  reports that she has never smoked. She has never used smokeless tobacco. She reports that she does not drink alcohol or use illicit drugs.  Allergies:  Allergies  Allergen Reactions  . Percocet (Oxycodone-Acetaminophen) Itching    Prescriptions prior to admission  Medication Sig Dispense Refill  . diphenhydramine-acetaminophen (TYLENOL PM) 25-500 MG TABS Take 1 tablet by mouth at bedtime as needed. For headaches.       . Simethicone (GAS RELIEF PO) Take 1 tablet by mouth daily as needed. For gas relief.       . ranitidine (ZANTAC) 150 MG tablet Take 150 mg by mouth 1 day or 1 dose.         . solifenacin (VESICARE) 5 MG tablet Take 10 mg by mouth daily.          ROS  Blood pressure 121/73, pulse 92, temperature 98.1 F (36.7 C), temperature source Oral, resp. rate 18, height 5' 6.5" (1.689 m), weight 67.586 kg (149 lb), last menstrual period 05/24/2011, SpO2 100.00%., she would like to conceive, having unprotected sex since marriage 10/09 Physical Exam Heart: rrr Lungs: CTAB Abd: thin, benign Pelvic: no adnexal masses, voluntary guarding Results for orders placed during the hospital encounter of 06/18/11 (from the past 24 hour(s))  PREGNANCY, URINE     Status: Normal   Collection Time   06/18/11  8:35 AM      Component Value Range   Preg Test, Ur NEGATIVE      No results found.  Assessment/Plan: Chronic pelvic pain-plan for laparoscopy. She understands risks of surgery.   Hedwig Mcfall C. 06/18/2011, 8:53 AM

## 2011-06-18 NOTE — Transfer of Care (Signed)
Immediate Anesthesia Transfer of Care Note  Patient: Meredith Howard  Procedure(s) Performed:  LAPAROSCOPY DIAGNOSTIC - with bilateral chromopertubation with excision of endometrial biopsy  Patient Location: PACU  Anesthesia Type: General  Level of Consciousness: awake and alert   Airway & Oxygen Therapy: Patient Spontanous Breathing and Patient connected to nasal cannula oxygen  Post-op Assessment: Report given to PACU RN and Post -op Vital signs reviewed and stable  Post vital signs: Reviewed and stable  Complications: No apparent anesthesia complications

## 2011-06-18 NOTE — Anesthesia Postprocedure Evaluation (Signed)
Anesthesia Post Note  Patient: Meredith Howard  Procedure(s) Performed:  LAPAROSCOPY DIAGNOSTIC - with bilateral chromopertubation with excision of endometrial biopsy  Anesthesia type: GA  Patient location: PACU  Post pain: Pain level controlled  Post assessment: Post-op Vital signs reviewed  Last Vitals:  Filed Vitals:   06/18/11 1045  BP: 118/63  Pulse: 100  Temp:   Resp: 12    Post vital signs: Reviewed  Level of consciousness: sedated  Complications: No apparent anesthesia complications

## 2011-06-18 NOTE — Op Note (Addendum)
06/18/2011  10:25 AM  PATIENT:  Meredith Howard  30 y.o. female  PRE-OPERATIVE DIAGNOSIS:  Pelvic pain in female [625.9]  POST-OPERATIVE DIAGNOSIS:  Pelvic pain in female [625.9], Stage 2 endometriosis  PROCEDURE:  Procedure(s): LAPAROSCOPY DIAGNOSTIC EXCISION OF ENDOMETRIOSIS  SURGEON:  Surgeon(s): Eulon Allnutt C. Marice Potter, MD  PHYSICIAN ASSISTANT:   ASSISTANTS: none   ANESTHESIA:   local and general  EBL:  Total I/O In: 1000 [I.V.:1000] Out: 250 [Urine:200; Blood:50]  BLOOD ADMINISTERED:none  DRAINS: none   LOCAL MEDICATIONS USED:  MARCAINE 20CC  SPECIMEN:  Source of Specimen:  endometriosis from posterior cul de sac   DISPOSITION OF SPECIMEN:  PATHOLOGY  COUNTS:  YES  TOURNIQUET:  * No tourniquets in log *  DICTATION: .Dragon Dictation  PLAN OF CARE: Discharge to home after PACU  PATIENT DISPOSITION:  PACU - hemodynamically stable.   Delay start of Pharmacological VTE agent (>24hrs) due to surgical blood loss or risk of bleeding:  no      The risks, benefits, and alternatives of surgery were explained, understood, accepted. Consents were signed and all questions were answered. She was taken to the operating room and general anesthesia was applied without complication. She was placed in the dorsal lithotomy position. A bimanual exam revealed her uterus to the retroverted with a fibroid on the left fundus. The adnexa were not enlarged. A rectovaginal exam did not reveal nodularity of the uterosacral ligaments. Gloves were changed her abdomen was prepped and draped usual sterile fashion an acorn uterine manipulator was placed. Gloves were changed once more and attention was turned to the abdomen. A curvilinear umbilical incision was made after injecting with approximately 10 mL of 0.5% Marcaine. A varies needle was placed intraperitoneally low-flow CO2 was used to insufflate the abdomen to approximately 4 L. After a good pneumoperitoneum was established, a 10 mm Excell trocar  was placed. Laparoscopy confirmed correct placement. She was placed in Trendelenburg position. A 5 mm port was placed in each lower quadrant under direct laparoscopic visualization, taking care to avoid the ureters.  5 ml of 0.5% Marcaine was injected prior to placing the ports. Her pelvis was inspected and an alpha manner after the upper abdomen was noted to be normal. The findings of the pelvis included a left cornual/fundal fibroid measuring about 3 cm. Both ovarian fossa were noted to be normal both ovaries appeared normal as did the fimbria and oviducts. In the posterior cul-de-sac there was a very large (1 cm) bluish lesion of endometriosis sitting with an half a centimeter of her left ureter. There were 2 small were powder burn-type lesions in the posterior cul-de-sac. The anterior cul-de-sac was free of endometriosis except a 0.5 cm lesion on the bladder itself towards the right-hand side. I was able to excise the 2 powder burn lesions in the posterior cul-de-sac without fear of damaging any surrounding organs, but the lesion on the bladder and the lesion next the left ureter where positions that would pose too much of a danger to surrounding organs for safe removal. I then proceeded with chromopertubation. The left oviduct easily drained the fluid however it required a fair amount of pressure and a second syringe full of indigo carmine to note the right oviduct flowing freely. The excess fluid was removed from her pelvis both biopsy sites were noted to be hemostatic. The 5 mm ports were removed under direct laparoscopic visualization. No bleeding was noted from those sites. The CO2 was allowed to escape from the abdomen and the  10 mm trocar was removed. The fascia at the umbilicus was closed with a figure of 8-0 Vicryl suture. 4-0 Vicryl suture was used to close all 3 sites in subcuticular area. Excellent hemostasis was noted. The acorn was removed from her cervix. She was extubated and taken to the  recovery room in stable condition. She tolerated the procedure well.

## 2011-06-18 NOTE — Anesthesia Procedure Notes (Signed)
Procedure Name: Intubation Date/Time: 06/18/2011 9:28 AM Performed by: Lincoln Brigham Pre-anesthesia Checklist: Patient identified, Emergency Drugs available, Suction available, Patient being monitored and Timeout performed Patient Re-evaluated:Patient Re-evaluated prior to inductionOxygen Delivery Method: Circle System Utilized Preoxygenation: Pre-oxygenation with 100% oxygen Intubation Type: IV induction, Circoid Pressure applied and Rapid sequence Laryngoscope Size: Miller and 2 Grade View: Grade I Tube type: Oral Tube size: 7.0 mm Number of attempts: 1 Airway Equipment and Method: stylet Placement Confirmation: ETT inserted through vocal cords under direct vision,  positive ETCO2 and breath sounds checked- equal and bilateral Secured at: 21 cm Tube secured with: Tape Dental Injury: Teeth and Oropharynx as per pre-operative assessment  Comments: RSI with cricoid no ventalation

## 2011-06-19 ENCOUNTER — Encounter (HOSPITAL_COMMUNITY): Payer: Self-pay | Admitting: Family Medicine

## 2011-06-19 ENCOUNTER — Inpatient Hospital Stay (HOSPITAL_COMMUNITY)
Admission: AD | Admit: 2011-06-19 | Discharge: 2011-06-19 | Disposition: A | Discharge status: Home / Self Care | Payer: Self-pay | Source: Ambulatory Visit | Attending: Family Medicine | Admitting: Family Medicine

## 2011-06-19 DIAGNOSIS — G8918 Other acute postprocedural pain: Secondary | ICD-10-CM | POA: Insufficient documentation

## 2011-06-19 LAB — URINALYSIS, ROUTINE W REFLEX MICROSCOPIC
Nitrite: NEGATIVE
Protein, ur: NEGATIVE mg/dL
Specific Gravity, Urine: <1.005 — ABNORMAL LOW (ref 1.005–1.030)
Urobilinogen, UA: 0.2 mg/dL (ref 0.0–1.0)

## 2011-06-19 LAB — CBC
MCH: 28.1 pg (ref 26.0–34.0)
MCHC: 33 g/dL (ref 30.0–36.0)
MCV: 85.2 fL (ref 78.0–100.0)
Platelets: 278 10*3/uL (ref 150–400)

## 2011-06-19 LAB — URINE MICROSCOPIC-ADD ON

## 2011-06-19 MED ORDER — PHENOL 1.4 % MT LIQD
1.0000 | OROMUCOSAL | Status: DC | PRN
  Administered 2011-06-19: 1 via OROMUCOSAL
  Filled 2011-06-19: qty 177

## 2011-06-19 MED ORDER — HYDROMORPHONE HCL 2 MG PO TABS
2.0000 mg | ORAL_TABLET | Freq: Once | ORAL | Status: AC
  Administered 2011-06-19: 2 mg via ORAL
  Filled 2011-06-19: qty 1

## 2011-06-19 NOTE — Progress Notes (Signed)
Pt reports she had a laparoscopic surgery yesterday for endometriosis. Pt reports she is having pain on her whole left side, fever, difficulty urinating.states she hasn;t been able to sleep since she was discharged yesterday. Left leg feels heavy.

## 2011-06-19 NOTE — ED Provider Notes (Signed)
History   Chief Complaint:  Post-op Problem   Meredith Howard is  30 y.o. G0P0000 Patient's last menstrual period was 05/24/2011.Marland Kitchen Patient is here for follow up of quantitative HCG and ongoing surveillance of pregnancy status.   She is s/p laparoscopy on 10/15 with excision of endometrisosis..    Since her last visit, the patient is with new complaint.   The patient reports left sided pain in her neck and back, abd. Pain and leg pain.  Has not taken pain med as directed.  General ROS:  negative for fever, chills, reports some nausea but no emesis.  She is having trouble lying flat secondary to pain.  Her previous Quantitative HCG values are:    Physical Exam   Blood pressure 123/80, pulse 100, temperature 99.2 F (37.3 C), resp. rate 20, last menstrual period 05/24/2011, SpO2 99.00%.  Focused Gynecological Exam: Gen-WD,WN female in obvious pain VS are reviewed. Abdomen: flat, tight, diffusely tender Ext: no cords, no ropes, no edema, no erythema Neck supple  Labs: Recent Results (from the past 24 hour(s))  PREGNANCY, URINE   Collection Time   06/18/11  8:35 AM      Component Value Range   Preg Test, Ur NEGATIVE    URINALYSIS, ROUTINE W REFLEX MICROSCOPIC   Collection Time   06/19/11  6:54 AM      Component Value Range   Color, Urine YELLOW  YELLOW    Appearance CLEAR  CLEAR    Specific Gravity, Urine <1.005 (*) 1.005 - 1.030    pH 6.0  5.0 - 8.0    Glucose, UA NEGATIVE  NEGATIVE (mg/dL)   Hgb urine dipstick SMALL (*) NEGATIVE    Bilirubin Urine NEGATIVE  NEGATIVE    Ketones, ur NEGATIVE  NEGATIVE (mg/dL)   Protein, ur NEGATIVE  NEGATIVE (mg/dL)   Urobilinogen, UA 0.2  0.0 - 1.0 (mg/dL)   Nitrite NEGATIVE  NEGATIVE    Leukocytes, UA NEGATIVE  NEGATIVE   URINE MICROSCOPIC-ADD ON   Collection Time   06/19/11  6:54 AM      Component Value Range   Squamous Epithelial / LPF RARE  RARE    RBC / HPF 0-2  <3 (RBC/hpf)   Casts HYALINE CASTS (*) NEGATIVE      Assessment: Post op pain- ? Related to lack of pain meds and gas.   Plan: Will give pain meds and see if this alleviates symptoms.  Eddie Koc S 06/19/2011, 8:00 AM

## 2011-06-19 NOTE — Progress Notes (Signed)
I came to see Meredith Howard about 10:30 am today.  She complains to me of neck, shoulder pain as well as a sore throat. She says she has eaten crackers here in the MAU and has some nausea.  She has had flatus.  VSS, AF Heart: rrr Lungs: CTAB Abd: NABS, slightly tympanitic (usual after a laparoscopy)         Please note that even preoperatively, she voluntarily guards with abd/pelvic exams Ext: no c/c/e  WBC 9, Hbg 10  A/P: post op pain- I think this is normal.  Her physical exam, labs, vitals are not convincing for a pathologic process. I will let her go home with pain precautions.

## 2011-06-25 ENCOUNTER — Encounter (HOSPITAL_COMMUNITY): Payer: Self-pay | Admitting: Obstetrics & Gynecology

## 2011-06-27 ENCOUNTER — Encounter (HOSPITAL_COMMUNITY): Payer: Self-pay | Admitting: *Deleted

## 2011-06-27 ENCOUNTER — Inpatient Hospital Stay (HOSPITAL_COMMUNITY)
Admission: AD | Admit: 2011-06-27 | Discharge: 2011-06-27 | Disposition: A | Discharge status: Home / Self Care | Payer: Self-pay | Source: Ambulatory Visit | Attending: Obstetrics and Gynecology | Admitting: Obstetrics and Gynecology

## 2011-06-27 DIAGNOSIS — R109 Unspecified abdominal pain: Secondary | ICD-10-CM | POA: Insufficient documentation

## 2011-06-27 DIAGNOSIS — N946 Dysmenorrhea, unspecified: Secondary | ICD-10-CM | POA: Insufficient documentation

## 2011-06-27 HISTORY — DX: Endometriosis of ovary, unspecified side, unspecified depth: N80.109

## 2011-06-27 HISTORY — DX: Endometriosis of ovary: N80.1

## 2011-06-27 LAB — CBC
MCHC: 32.5 g/dL (ref 30.0–36.0)
MCV: 85.6 fL (ref 78.0–100.0)
Platelets: 314 10*3/uL (ref 150–400)
RDW: 14.2 % (ref 11.5–15.5)
WBC: 3.8 10*3/uL — ABNORMAL LOW (ref 4.0–10.5)

## 2011-06-27 LAB — URINALYSIS, ROUTINE W REFLEX MICROSCOPIC
Bilirubin Urine: NEGATIVE
Nitrite: NEGATIVE
Protein, ur: NEGATIVE mg/dL
Specific Gravity, Urine: >1.03 — ABNORMAL HIGH (ref 1.005–1.030)
Urobilinogen, UA: 0.2 mg/dL (ref 0.0–1.0)

## 2011-06-27 LAB — URINE MICROSCOPIC-ADD ON

## 2011-06-27 MED ORDER — IBUPROFEN 800 MG PO TABS
800.0000 mg | ORAL_TABLET | Freq: Once | ORAL | Status: AC
  Administered 2011-06-27: 800 mg via ORAL
  Filled 2011-06-27: qty 1

## 2011-06-27 MED ORDER — IBUPROFEN 600 MG PO TABS: 600.0000 mg | ORAL_TABLET | Freq: Four times a day (QID) | ORAL | Status: AC | PRN

## 2011-06-27 NOTE — Progress Notes (Signed)
Pt in c/o heavy vaginal bleeding with clots, had laparoscopy by Dr. Marice Potter last Monday, stopped bleeding for a day or two and then period started.  Has been going through 3 pads an hour.  Reports abdominal cramping that starts in middle of abdomen and radiates to back and down legs.   Has been taking vicodin, has not helped.

## 2011-06-27 NOTE — Progress Notes (Signed)
Patient states she had a laparoscopy on 10-15 done by Dr. Marice Potter for endometriosis. States she started spotting just after the surgery, stopped for a few days and started again on the 20th and has progressed to heavy bleeding with passing clots. Abdominal pain has been increasing and the Vicodin is not working. Has been having panic attacks about twice during the night.

## 2011-06-27 NOTE — ED Provider Notes (Signed)
Meredith Howard y.o.G0P0000 @9  days postop ex lap and excision Chief Complaint  Patient presents with  . Vaginal Bleeding  . Abdominal Pain    SUBJECTIVE  HPI:  She is concerned because her menses which began 06/23/11 is much heaver than usual and with clots. She is passing quantities of blood in the toilet q 15 min and feels lightheaded. Post op she had light spotting for a few which stopped days before onset of menses. She is also having severe suprapubic abd pain that did not improve after Vidodin.  Also having urinary frequency and lower abd pain with urination.  She had ex lap and excision of endometriosis lesions in post cul de sac and surgery was uncomplicated. She has a 3 cm fundal fibroid and had been followed for pelvic pain and abnormal bleeding. She is desirous of becoming pregnant. 06/19/11 hgb was 10.8.  Past Medical History  Diagnosis Date  . GERD (gastroesophageal reflux disease)   . Neuromuscular disorder     fibromyalagia  . Migraine   . Back pain   . Gastritis   . Interstitial cystitis   . Anxiety     pt states she experiences panic attacks  . Endometrial cyst of ovary    Past Surgical History  Procedure Date  . No past surgeries   . Laparoscopy 06/18/2011    Procedure: LAPAROSCOPY DIAGNOSTIC;  Surgeon: Hollie Salk C. Marice Potter, MD;  Location: [facility];  Service: Gynecology;  Laterality: Bilateral;  with bilateral chromopertubation with excision of endometrial biopsy   History   Social History  . Marital Status: Married    Spouse Name: N/A    Number of Children: N/A  . Years of Education: N/A   Occupational History  . Not on file.   Social History Main Topics  . Smoking status: Never Smoker   . Smokeless tobacco: Never Used  . Alcohol Use: No  . Drug Use: No  . Sexually Active: Yes    Birth Control/ Protection: None   Other Topics Concern  . Not on file   Social History Narrative  . No narrative on file   No current facility-administered medications on  file prior to encounter.   Current Outpatient Prescriptions on File Prior to Encounter  Medication Sig Dispense Refill  . BAYER ASPIRIN PO Take 2 tablets by mouth daily.        . diphenhydramine-acetaminophen (TYLENOL PM) 25-500 MG TABS Take 1 tablet by mouth at bedtime as needed. For headaches.       Marland Kitchen HYDROcodone-acetaminophen (VICODIN) 5-500 MG per tablet Take 1 tablet by mouth every 6 (six) hours as needed for pain.  30 tablet  0  . ranitidine (ZANTAC) 150 MG tablet Take 150 mg by mouth 1 day or 1 dose.        . Simethicone (GAS RELIEF PO) Take 1 tablet by mouth daily as needed. For gas relief.       . solifenacin (VESICARE) 5 MG tablet Take 10 mg by mouth daily.         Allergies  Allergen Reactions  . Percocet (Oxycodone-Acetaminophen) Itching    ROS: Pertinent items in HPI  OBJECTIVE  BP 140/82  Pulse 83  Temp(Src) 98.7 F (37.1 C) (Oral)  Resp 20  Ht 5\' 7"  (1.702 m)  Wt 67.314 kg (148 lb 6.4 oz)  BMI 23.24 kg/m2  SpO2 99%  LMP 06/23/2011   Physical Exam:  General: WN/WD in NAD Abd: soft, minimally tender over SP, no guarding or  rebound Pelvic:mod menstruallike bleeding. Cx closed.  Uterus somewhat tender on exam. Adnexae without mass or tenderness noted but pt guarding so hard to assess Back: neg CVAT  Results for orders placed during the hospital encounter of 06/27/11 (from the past 24 hour(s))  CBC     Status: Abnormal   Collection Time   06/27/11  6:20 PM      Component Value Range   WBC 3.8 (*) 4.0 - 10.5 (K/uL)   RBC 3.88  3.87 - 5.11 (MIL/uL)   Hemoglobin 10.8 (*) 12.0 - 15.0 (g/dL)   HCT 40.9 (*) 81.1 - 46.0 (%)   MCV 85.6  78.0 - 100.0 (fL)   MCH 27.8  26.0 - 34.0 (pg)   MCHC 32.5  30.0 - 36.0 (g/dL)   RDW 91.4  78.2 - 95.6 (%)   Platelets 314  150 - 400 (K/uL)  URINALYSIS, ROUTINE W REFLEX MICROSCOPIC     Status: Abnormal   Collection Time   06/27/11  6:25 PM      Component Value Range   Color, Urine YELLOW  YELLOW    Appearance CLEAR  CLEAR      Specific Gravity, Urine >1.030 (*) 1.005 - 1.030    pH 5.5  5.0 - 8.0    Glucose, UA NEGATIVE  NEGATIVE (mg/dL)   Hgb urine dipstick SMALL (*) NEGATIVE    Bilirubin Urine NEGATIVE  NEGATIVE    Ketones, ur NEGATIVE  NEGATIVE (mg/dL)   Protein, ur NEGATIVE  NEGATIVE (mg/dL)   Urobilinogen, UA 0.2  0.0 - 1.0 (mg/dL)   Nitrite NEGATIVE  NEGATIVE    Leukocytes, UA NEGATIVE  NEGATIVE   URINE MICROSCOPIC-ADD ON     Status: Abnormal   Collection Time   06/27/11  6:25 PM      Component Value Range   Squamous Epithelial / LPF FEW (*) RARE    RBC / HPF 0-2  <3 (RBC/hpf)   Bacteria, UA RARE  RARE    Urine-Other MUCOUS PRESENT       ASSESSMENT  Menstrual bleeding with exacerbation of dysmenorrhea due to postop state Hemodynamically stable   PLAN D/W Dr. Emelda Fear D/C home on Vicodin 2 po q 4-6 h as needed and add ibuprofen 800 mg po q8h F/U GYN Clinic. Restrict activity, pelvic rest

## 2011-07-09 ENCOUNTER — Encounter (HOSPITAL_COMMUNITY): Payer: Self-pay

## 2011-07-09 ENCOUNTER — Telehealth: Payer: Self-pay | Admitting: *Deleted

## 2011-07-09 ENCOUNTER — Inpatient Hospital Stay (HOSPITAL_COMMUNITY)
Admission: AD | Admit: 2011-07-09 | Discharge: 2011-07-09 | Disposition: A | Discharge status: Home / Self Care | Payer: Medicaid Other | Source: Ambulatory Visit | Attending: Obstetrics and Gynecology | Admitting: Obstetrics and Gynecology

## 2011-07-09 DIAGNOSIS — R51 Headache: Secondary | ICD-10-CM | POA: Insufficient documentation

## 2011-07-09 DIAGNOSIS — N949 Unspecified condition associated with female genital organs and menstrual cycle: Secondary | ICD-10-CM | POA: Insufficient documentation

## 2011-07-09 DIAGNOSIS — N644 Mastodynia: Secondary | ICD-10-CM | POA: Insufficient documentation

## 2011-07-09 DIAGNOSIS — R110 Nausea: Secondary | ICD-10-CM | POA: Insufficient documentation

## 2011-07-09 DIAGNOSIS — N940 Mittelschmerz: Secondary | ICD-10-CM

## 2011-07-09 HISTORY — DX: Diaphragmatic hernia without obstruction or gangrene: K44.9

## 2011-07-09 LAB — URINALYSIS, ROUTINE W REFLEX MICROSCOPIC
Bilirubin Urine: NEGATIVE
Glucose, UA: NEGATIVE mg/dL
Hgb urine dipstick: NEGATIVE
Ketones, ur: 15 mg/dL — AB
Leukocytes, UA: NEGATIVE
Nitrite: NEGATIVE
Protein, ur: NEGATIVE mg/dL
Specific Gravity, Urine: >1.03 — ABNORMAL HIGH (ref 1.005–1.030)
Urobilinogen, UA: 0.2 mg/dL (ref 0.0–1.0)
pH: 5.5 (ref 5.0–8.0)

## 2011-07-09 LAB — DIFFERENTIAL
Basophils Absolute: 0 10*3/uL (ref 0.0–0.1)
Eosinophils Absolute: 0 10*3/uL (ref 0.0–0.7)
Eosinophils Relative: 1 % (ref 0–5)
Lymphocytes Relative: 44 % (ref 12–46)
Monocytes Absolute: 0.5 10*3/uL (ref 0.1–1.0)

## 2011-07-09 LAB — CBC
HCT: 34.8 % — ABNORMAL LOW (ref 36.0–46.0)
MCH: 28.4 pg (ref 26.0–34.0)
MCHC: 32.8 g/dL (ref 30.0–36.0)
MCV: 86.6 fL (ref 78.0–100.0)
Platelets: 283 10*3/uL (ref 150–400)
RDW: 14.2 % (ref 11.5–15.5)
WBC: 4.6 10*3/uL (ref 4.0–10.5)

## 2011-07-09 LAB — HCG, QUANTITATIVE, PREGNANCY: hCG, Beta Chain, Quant, S: <1 m[IU]/mL

## 2011-07-09 LAB — WET PREP, GENITAL
Trich, Wet Prep: NONE SEEN
Yeast Wet Prep HPF POC: NONE SEEN

## 2011-07-09 LAB — POCT PREGNANCY, URINE: Preg Test, Ur: NEGATIVE

## 2011-07-09 MED ORDER — PROMETHAZINE HCL 25 MG PO TABS: 25.0000 mg | ORAL_TABLET | Freq: Four times a day (QID) | ORAL | Status: DC | PRN

## 2011-07-09 NOTE — Telephone Encounter (Signed)
Patient called today at 10:35am and left a message she had a laparoscopy 2 weeks ago and states" I think I might be pregnant- I am having headaches, backpain, cramps, loss of appetite, and nausea and pain where I had surgery- I am not sure what to do- I don't know if it is too early - can I get a blood test to see if I am pregnant or see what's going on?  Call me"

## 2011-07-09 NOTE — Progress Notes (Signed)
Nausea for a wk.  No appetite, hasn't been able to eat since surgery 3 wks ago.   laproscopic for endometriosis. Back pain, headache.  Cramps.

## 2011-07-09 NOTE — ED Provider Notes (Signed)
History     CSN: [id number] Arrival date & time: 07/09/2011  5:58 PM   None     Chief Complaint  Patient presents with  . Nausea    HPI Meredith Howard is a 30 y.o. female who presents to [facility] for nausea that started 5 days ago. Decreased appetite, breast tenderness. Headache that started a week ago and has been off and on. Pelvic pain. LMP 06/23/11. Current sex partner x 6 years. Never been pregnant. Las Pap smear, [facility], less than one year and was normal.  Patient states she thought she was pregnant because she had all the symptoms. Request "blood" pregnancy test. History of endometriosis. The history was provided by the patient.  Past Medical History  Diagnosis Date  . Neuromuscular disorder     fibromyalagia  . Back pain   . Gastritis   . Interstitial cystitis   . Endometrial cyst of ovary   . Anxiety     pt states she experiences panic attacks  . GERD (gastroesophageal reflux disease)   . Migraine   . Hernia, hiatal     Past Surgical History  Procedure Date  . Laparoscopy 06/18/2011    Procedure: LAPAROSCOPY DIAGNOSTIC;  Surgeon: Hollie Salk C. Marice Potter, MD;  Location: [facility];  Service: Gynecology;  Laterality: Bilateral;  with bilateral chromopertubation with excision of endometrial biopsy    Family History  Problem Relation Age of Onset  . Fibroids Mother   . Hypertension Mother   . Hypertension Father   . Diabetes Father   . Kidney disease Father     kidney stones  . Gout Father     History  Substance Use Topics  . Smoking status: Never Smoker   . Smokeless tobacco: Never Used  . Alcohol Use: No    OB History    Grav Para Term Preterm Abortions TAB SAB Ect Mult Living   0 0 0 0 0 0 0 0 0 0       Review of Systems  Constitutional: Positive for appetite change and fatigue. Negative for fever, chills and diaphoresis.  HENT: Negative for ear pain, congestion, sore throat, facial swelling, neck pain, neck stiffness, dental problem and sinus pressure.     Eyes: Positive for visual disturbance. Negative for photophobia, pain and discharge.  Respiratory: Positive for cough. Negative for chest tightness and wheezing.   Cardiovascular: Negative.   Gastrointestinal: Positive for nausea, abdominal pain and diarrhea. Negative for vomiting, constipation and abdominal distention.  Genitourinary: Positive for frequency, vaginal discharge and pelvic pain. Negative for dysuria, flank pain, vaginal bleeding and difficulty urinating.  Musculoskeletal: Positive for back pain. Negative for myalgias and gait problem.  Skin: Negative for color change and rash.  Neurological: Positive for light-headedness and headaches. Negative for speech difficulty, weakness and numbness.  Psychiatric/Behavioral: Negative for confusion and agitation. The patient is nervous/anxious.        Panic attacks    Allergies  Percocet  Home Medications  No current outpatient prescriptions on file.  BP 120/76  Pulse 96  Temp(Src) 99.1 F (37.3 C) (Oral)  Resp 20  Ht 5' 6.5" (1.689 m)  Wt 147 lb (66.679 kg)  BMI 23.37 kg/m2  SpO2 98%  LMP 06/23/2011  Physical Exam  Nursing note and vitals reviewed. Constitutional: She is oriented to person, place, and time. She appears well-developed and well-nourished. No distress.  HENT:  Head: Normocephalic.  Eyes: EOM are normal.  Neck: Neck supple.  Cardiovascular: Normal rate.   Pulmonary/Chest: Effort  normal. She has no wheezes. She has no rales.  Abdominal: Soft. There is tenderness.       Tenderness at umbilicus with small reducible hernia. Mildly tender suprapubic area. No guarding or rebound.  Genitourinary: Uterus is not enlarged and not tender. Cervix exhibits no motion tenderness and no friability. Left adnexum displays tenderness. Vaginal discharge (clear, stretchy) found.       No CVA tenderness  Musculoskeletal: Normal range of motion.       Lower back pain with ROM  Neurological: She is alert and oriented to person,  place, and time. No cranial nerve deficit.  Skin: Skin is warm and dry.  Psychiatric: Her behavior is normal. Judgment and thought content normal.   Care turned over to Georges Mouse, CNM  ED Course  Procedures  Results for orders placed during the hospital encounter of 07/09/11 (from the past 24 hour(s))  URINALYSIS, ROUTINE W REFLEX MICROSCOPIC     Status: Abnormal   Collection Time   07/09/11  6:40 PM      Component Value Range   Color, Urine YELLOW  YELLOW    Appearance CLEAR  CLEAR    Specific Gravity, Urine >1.030 (*) 1.005 - 1.030    pH 5.5  5.0 - 8.0    Glucose, UA NEGATIVE  NEGATIVE (mg/dL)   Hgb urine dipstick NEGATIVE  NEGATIVE    Bilirubin Urine NEGATIVE  NEGATIVE    Ketones, ur 15 (*) NEGATIVE (mg/dL)   Protein, ur NEGATIVE  NEGATIVE (mg/dL)   Urobilinogen, UA 0.2  0.0 - 1.0 (mg/dL)   Nitrite NEGATIVE  NEGATIVE    Leukocytes, UA NEGATIVE  NEGATIVE   POCT PREGNANCY, URINE     Status: Normal   Collection Time   07/09/11  6:55 PM      Component Value Range   Preg Test, Ur NEGATIVE    CBC     Status: Abnormal   Collection Time   07/09/11  7:50 PM      Component Value Range   WBC 4.6  4.0 - 10.5 (K/uL)   RBC 4.02  3.87 - 5.11 (MIL/uL)   Hemoglobin 11.4 (*) 12.0 - 15.0 (g/dL)   HCT 16.1 (*) 09.6 - 46.0 (%)   MCV 86.6  78.0 - 100.0 (fL)   MCH 28.4  26.0 - 34.0 (pg)   MCHC 32.8  30.0 - 36.0 (g/dL)   RDW 04.5  40.9 - 81.1 (%)   Platelets 283  150 - 400 (K/uL)  DIFFERENTIAL     Status: Normal   Collection Time   07/09/11  7:50 PM      Component Value Range   Neutrophils Relative 46  43 - 77 (%)   Neutro Abs 2.1  1.7 - 7.7 (K/uL)   Lymphocytes Relative 44  12 - 46 (%)   Lymphs Abs 2.0  0.7 - 4.0 (K/uL)   Monocytes Relative 10  3 - 12 (%)   Monocytes Absolute 0.5  0.1 - 1.0 (K/uL)   Eosinophils Relative 1  0 - 5 (%)   Eosinophils Absolute 0.0  0.0 - 0.7 (K/uL)   Basophils Relative 0  0 - 1 (%)   Basophils Absolute 0.0  0.0 - 0.1 (K/uL)  HCG, QUANTITATIVE,  PREGNANCY     Status: Normal   Collection Time   07/09/11  7:50 PM      Component Value Range   hCG, Beta Chain, Quant, S <1  <5 (mIU/mL)  WET PREP, GENITAL  Status: Abnormal   Collection Time   07/09/11  8:14 PM      Component Value Range   Yeast, Wet Prep NONE SEEN  NONE SEEN    Trich, Wet Prep NONE SEEN  NONE SEEN    Clue Cells, Wet Prep NONE SEEN  NONE SEEN    WBC, Wet Prep HPF POC FEW (*) NONE SEEN      MDM    A/P: 30 y.o. G0P0000 Likely ovulatory pain - discussed that if she is desiring pregnancy, she may be ovulating now, if no period in 2 weeks, take home UPT F/U with severe pain or excessive bleeding      Kerrie Buffalo, NP 07/09/11 1951

## 2011-07-10 LAB — GC/CHLAMYDIA PROBE AMP, GENITAL
Chlamydia, DNA Probe: NEGATIVE
GC Probe Amp, Genital: NEGATIVE

## 2011-07-10 NOTE — ED Provider Notes (Signed)
Agree with above note.  Meredith Howard 07/10/2011 12:39 AM

## 2011-07-10 NOTE — Telephone Encounter (Signed)
Pt left additional message on 07/09/11 @ 1630. She reported the same sx as previously documented by [facility]. Pt asked if she needs pregnancy blood test and wants to speak to a nurse.  I called and spoke w/pt. She informed me that she had gone to [facility] for evaluation yesterday. She is beginning to feel better and had no further questions. I confirmed her follow up appt @ clinic on 11/21 @ 1600. Pt voiced understanding.

## 2011-07-25 ENCOUNTER — Ambulatory Visit (INDEPENDENT_AMBULATORY_CARE_PROVIDER_SITE_OTHER): Payer: Self-pay | Admitting: Obstetrics & Gynecology

## 2011-07-25 ENCOUNTER — Encounter: Payer: Self-pay | Admitting: Obstetrics & Gynecology

## 2011-07-25 VITALS — BP 124/83 | HR 108 | Temp 98.5°F | Resp 20 | Ht 66.5 in | Wt 144.5 lb

## 2011-07-25 DIAGNOSIS — Z331 Pregnant state, incidental: Secondary | ICD-10-CM

## 2011-07-25 DIAGNOSIS — Z9889 Other specified postprocedural states: Secondary | ICD-10-CM

## 2011-07-25 DIAGNOSIS — Z23 Encounter for immunization: Secondary | ICD-10-CM

## 2011-07-25 DIAGNOSIS — Z09 Encounter for follow-up examination after completed treatment for conditions other than malignant neoplasm: Secondary | ICD-10-CM

## 2011-07-25 MED ORDER — INFLUENZA VIRUS VACC SPLIT PF IM SUSP
0.5000 mL | Freq: Once | INTRAMUSCULAR | Status: AC
  Administered 2011-07-25: 0.5 mL via INTRAMUSCULAR

## 2011-07-25 MED ORDER — INFLUENZA VIRUS VACC SPLIT PF IM SUSP: 0.5000 mL | Freq: Once | INTRAMUSCULAR | Status: DC

## 2011-07-25 NOTE — Progress Notes (Signed)
  Subjective:    Patient ID: Meredith Howard, female    DOB: 01-11-1981, 30 y.o.   MRN: [id number]  HPI Meredith Howard is here for a post op visit after I did a diagnostic laparoscopy, excision of endometriosis and chromopertubation. She has some umbilical tenderness but is having sex and in fact, had a positive pregnancy test yesterday.   Review of Systems     Objective:   Physical Exam Incisions - all healed great       Assessment & Plan:  Post op doing well Pregnancy- she should schedule OB care at her convenience.

## 2011-07-25 NOTE — Progress Notes (Signed)
Pt had weakly positive UPT @ home yesterday.   Pt has questions about the flu vaccine.

## 2011-07-25 NOTE — Progress Notes (Signed)
Addended by: Toula Moos on: 07/25/2011 04:45 PM   Modules accepted: Orders

## 2011-08-01 ENCOUNTER — Encounter: Payer: Self-pay | Admitting: *Deleted

## 2011-08-02 ENCOUNTER — Emergency Department (HOSPITAL_COMMUNITY): Payer: Self-pay

## 2011-08-02 ENCOUNTER — Encounter (HOSPITAL_COMMUNITY): Payer: Self-pay | Admitting: *Deleted

## 2011-08-02 ENCOUNTER — Emergency Department (HOSPITAL_COMMUNITY)
Admission: EM | Admit: 2011-08-02 | Discharge: 2011-08-03 | Disposition: A | Discharge status: Home / Self Care | Payer: Self-pay | Attending: Emergency Medicine | Admitting: Emergency Medicine

## 2011-08-02 DIAGNOSIS — O26899 Other specified pregnancy related conditions, unspecified trimester: Secondary | ICD-10-CM

## 2011-08-02 DIAGNOSIS — M7989 Other specified soft tissue disorders: Secondary | ICD-10-CM | POA: Insufficient documentation

## 2011-08-02 DIAGNOSIS — R10814 Left lower quadrant abdominal tenderness: Secondary | ICD-10-CM | POA: Insufficient documentation

## 2011-08-02 DIAGNOSIS — R110 Nausea: Secondary | ICD-10-CM | POA: Insufficient documentation

## 2011-08-02 DIAGNOSIS — O99891 Other specified diseases and conditions complicating pregnancy: Secondary | ICD-10-CM | POA: Insufficient documentation

## 2011-08-02 DIAGNOSIS — R109 Unspecified abdominal pain: Secondary | ICD-10-CM | POA: Insufficient documentation

## 2011-08-02 DIAGNOSIS — M549 Dorsalgia, unspecified: Secondary | ICD-10-CM | POA: Insufficient documentation

## 2011-08-02 DIAGNOSIS — F411 Generalized anxiety disorder: Secondary | ICD-10-CM | POA: Insufficient documentation

## 2011-08-02 DIAGNOSIS — K219 Gastro-esophageal reflux disease without esophagitis: Secondary | ICD-10-CM | POA: Insufficient documentation

## 2011-08-02 LAB — PREGNANCY, URINE: Preg Test, Ur: POSITIVE

## 2011-08-02 LAB — URINALYSIS, ROUTINE W REFLEX MICROSCOPIC
Nitrite: NEGATIVE
Specific Gravity, Urine: 1.023 (ref 1.005–1.030)
Urobilinogen, UA: 0.2 mg/dL (ref 0.0–1.0)
pH: 5.5 (ref 5.0–8.0)

## 2011-08-02 NOTE — ED Notes (Signed)
Pt is pregnant and has has had pain in her abdominal area, no fluid or bleeding from vagina with this.  Pt is also concerned about bilateral knee swelling.  No CP, pt gets sob when laying on her back

## 2011-08-02 NOTE — ED Provider Notes (Signed)
History     CSN: [id number] Arrival date & time: 08/02/2011  6:18 PM   First MD Initiated Contact with Patient 08/02/11 2043      Chief Complaint  Patient presents with  . Leg Swelling  . Abdominal Pain    (Consider location/radiation/quality/duration/timing/severity/associated sxs/prior treatment) HPI Comments: States that she found out she was pregnant and the day before Thanksgiving and she is about [redacted] weeks pregnant.  This is her first pregnancy.  She presents complaining of left-sided abdominal pain and left sided CVA tenderness.  The patient did not have a history of stones however it does run in her family.  Patient denies any bleeding or leaking from her vagina, vomiting or diarrhea. Denies fever, night sweats & chills.   Patient is a 30 y.o. female presenting with abdominal pain. The history is provided by the patient.  Abdominal Pain The primary symptoms of the illness include abdominal pain and nausea. The primary symptoms of the illness do not include vomiting, diarrhea or vaginal discharge.  Additional symptoms associated with the illness include back pain. Symptoms associated with the illness do not include constipation.    Past Medical History  Diagnosis Date  . Neuromuscular disorder     fibromyalagia  . Back pain   . Gastritis   . Interstitial cystitis   . Endometrial cyst of ovary   . Anxiety     pt states she experiences panic attacks  . GERD (gastroesophageal reflux disease)   . Migraine   . Hernia, hiatal   . Fibroid   . Panic attack     Past Surgical History  Procedure Date  . Laparoscopy 06/18/2011    Procedure: LAPAROSCOPY DIAGNOSTIC;  Surgeon: Hollie Salk C. Marice Potter, MD;  Location: [facility];  Service: Gynecology;  Laterality: Bilateral;  with bilateral chromopertubation with excision of endometrial biopsy    Family History  Problem Relation Age of Onset  . Fibroids Mother   . Hypertension Mother   . Hypertension Father   . Diabetes Father   . Kidney  disease Father     kidney stones  . Gout Father     History  Substance Use Topics  . Smoking status: Never Smoker   . Smokeless tobacco: Never Used  . Alcohol Use: No    OB History    Grav Para Term Preterm Abortions TAB SAB Ect Mult Living   0 0 0 0 0 0 0 0 0 0       Review of Systems  Cardiovascular: Positive for leg swelling. Negative for chest pain and palpitations.  Gastrointestinal: Positive for nausea and abdominal pain. Negative for vomiting, diarrhea and constipation.  Genitourinary: Negative for vaginal discharge.  Musculoskeletal: Positive for back pain.  Skin: Negative for pallor and rash.  Neurological: Negative for syncope, weakness, light-headedness, numbness and headaches.  Hematological: Does not bruise/bleed easily.    Allergies  Percocet  Home Medications   Current Outpatient Rx  Name Route Sig Dispense Refill  . PRENATAL COMPLETE PO Oral Take 1 tablet by mouth daily.      Marland Kitchen RANITIDINE HCL 150 MG PO TABS Oral Take 150 mg by mouth daily.       BP 119/64  Pulse 100  Temp(Src) 98 F (36.7 C) (Oral)  Resp 16  SpO2 100%  Physical Exam  Nursing note and vitals reviewed. Constitutional: She is oriented to person, place, and time. She appears well-developed and well-nourished. No distress.  HENT:  Head: Normocephalic and atraumatic.  Eyes: EOM are normal.  Normal appearance  Neck: Normal range of motion. Neck supple.  Cardiovascular: Normal rate, regular rhythm, normal heart sounds and intact distal pulses.   Pulmonary/Chest: Effort normal and breath sounds normal.  Abdominal: Soft. Bowel sounds are normal. She exhibits no distension and no mass. There is tenderness (CVA tenderness and left lower quadrant mild tenderness). There is no rebound and no guarding.       No CVA tenderness  Musculoskeletal: Normal range of motion. She exhibits edema. She exhibits no tenderness.  Neurological: She is alert and oriented to person, place, and time.    Skin: Skin is warm and dry. No rash noted.  Psychiatric: She has a normal mood and affect. Her behavior is normal.    ED Course  Procedures (including critical care time)  Labs Reviewed  URINALYSIS, ROUTINE W REFLEX MICROSCOPIC - Abnormal; Notable for the following:    APPearance HAZY (*)    All other components within normal limits  PREGNANCY, URINE   US Ob Comp Less 14 Wks  08/02/2011  *RADIOLOGY REPORT*  Clinical Data: Early pregnancy and abdominal pain.  Estimated gestational age of [redacted] weeks and 5 days by last menstrual period.  OBSTETRIC <14 WK [place] AND TRANSVAGINAL OB US  Technique:  Both transabdominal and transvaginal ultrasound examinations were performed for complete evaluation of the gestation as well as the maternal uterus, adnexal regions, and pelvic cul-de-sac.  Transvaginal technique was performed to assess early pregnancy.  Comparison:  None.  Intrauterine gestational sac:  Single intrauterine gestational sac visualized. Yolk sac: Small yolk sac visualized. Embryo: An embryo is not yet visualized. Cardiac Activity: No cardiac activity detected.  The gestation is likely too early to detect cardiac activity.  MSD: 9.6  mm  5    w 5    d [place] EDC: 03/29/2012  Maternal uterus/adnexae: Focal fundal fibroid visualized measuring 1.3 x 1.6 x 1.5 cm.  The right ovary measures approximately 4.1 x 2.0 x 3.5 cm.  Elongated complex cystic area noted in the right ovary measuring 3.2 x 1.0 x 1.8 cm and containing complex fluid.  This could be a hemorrhagic cyst or corpus luteum.  The left ovary measures 2.6 x 3.9 x 1.6 cm and has a normal appearance.  There is a small amount of free fluid in the pelvis.  IMPRESSION:  1.  Intrauterine gestation with estimated gestational age of [redacted] weeks and 5 days by ultrasound.  Yolk sac is detected.  It is still too early to detect any fetal pole or cardiac activity. 2.  Small fundal fibroid of the uterus 3.  Elongated complex cystic structure of the right ovary may  represent hemorrhagic cyst or hemorrhagic corpus luteum.  Original Report Authenticated By: Reola Calkins, M.D.   US Ob Transvaginal  08/02/2011  *RADIOLOGY REPORT*  Clinical Data: Early pregnancy and abdominal pain.  Estimated gestational age of [redacted] weeks and 5 days by last menstrual period.  OBSTETRIC <14 WK [place] AND TRANSVAGINAL OB US  Technique:  Both transabdominal and transvaginal ultrasound examinations were performed for complete evaluation of the gestation as well as the maternal uterus, adnexal regions, and pelvic cul-de-sac.  Transvaginal technique was performed to assess early pregnancy.  Comparison:  None.  Intrauterine gestational sac:  Single intrauterine gestational sac visualized. Yolk sac: Small yolk sac visualized. Embryo: An embryo is not yet visualized. Cardiac Activity: No cardiac activity detected.  The gestation is likely too early to detect cardiac activity.  MSD: 9.6  mm  5  w 5    d [place] EDC: 03/29/2012  Maternal uterus/adnexae: Focal fundal fibroid visualized measuring 1.3 x 1.6 x 1.5 cm.  The right ovary measures approximately 4.1 x 2.0 x 3.5 cm.  Elongated complex cystic area noted in the right ovary measuring 3.2 x 1.0 x 1.8 cm and containing complex fluid.  This could be a hemorrhagic cyst or corpus luteum.  The left ovary measures 2.6 x 3.9 x 1.6 cm and has a normal appearance.  There is a small amount of free fluid in the pelvis.  IMPRESSION:  1.  Intrauterine gestation with estimated gestational age of [redacted] weeks and 5 days by ultrasound.  Yolk sac is detected.  It is still too early to detect any fetal pole or cardiac activity. 2.  Small fundal fibroid of the uterus 3.  Elongated complex cystic structure of the right ovary may represent hemorrhagic cyst or hemorrhagic corpus luteum.  Original Report Authenticated By: Reola Calkins, M.D.   Results of the urine and transvaginal ultrasound discussed with the patient.  She understands it is important for her to followup with  OB/GYN in regards to her pregnancy.  She will call them tomorrow morning to schedule an outpatient appointment.  She is been advised to return to the emergency department if any concerning symptoms develop the we've discussed the symptoms.  Patient verbalizes her understanding and has no current complaints.   MDM  Pregnant  Abdominal pain         [place], [place] 08/02/11 2359

## 2011-08-03 NOTE — ED Provider Notes (Signed)
Medical screening examination/treatment/procedure(s) were performed by non-physician practitioner and as supervising physician I was immediately available for consultation/collaboration.  Ethelda Chick, MD 08/03/11 [phone]

## 2011-08-29 ENCOUNTER — Inpatient Hospital Stay (HOSPITAL_COMMUNITY)
Admission: AD | Admit: 2011-08-29 | Discharge: 2011-08-29 | Disposition: A | Discharge status: Home / Self Care | Payer: Medicaid Other | Source: Ambulatory Visit | Attending: Obstetrics and Gynecology | Admitting: Obstetrics and Gynecology

## 2011-08-29 ENCOUNTER — Encounter (HOSPITAL_COMMUNITY): Payer: Self-pay

## 2011-08-29 ENCOUNTER — Inpatient Hospital Stay (HOSPITAL_COMMUNITY): Payer: Medicaid Other

## 2011-08-29 DIAGNOSIS — O99891 Other specified diseases and conditions complicating pregnancy: Secondary | ICD-10-CM | POA: Insufficient documentation

## 2011-08-29 DIAGNOSIS — O26899 Other specified pregnancy related conditions, unspecified trimester: Secondary | ICD-10-CM

## 2011-08-29 DIAGNOSIS — R109 Unspecified abdominal pain: Secondary | ICD-10-CM | POA: Insufficient documentation

## 2011-08-29 LAB — CBC
HCT: 31.5 % — ABNORMAL LOW (ref 36.0–46.0)
Hemoglobin: 10.5 g/dL — ABNORMAL LOW (ref 12.0–15.0)
MCHC: 33.3 g/dL (ref 30.0–36.0)
RBC: 3.68 MIL/uL — ABNORMAL LOW (ref 3.87–5.11)
WBC: 4.6 10*3/uL (ref 4.0–10.5)

## 2011-08-29 LAB — URINALYSIS, ROUTINE W REFLEX MICROSCOPIC
Glucose, UA: NEGATIVE mg/dL
Ketones, ur: NEGATIVE mg/dL
Leukocytes, UA: NEGATIVE
Nitrite: NEGATIVE
Specific Gravity, Urine: <1.005 — ABNORMAL LOW (ref 1.005–1.030)
pH: 6 (ref 5.0–8.0)

## 2011-08-29 NOTE — Progress Notes (Signed)
Pt states lower abd pain began early this am, G1, denies bleeding or abnormal vaginal d/c changes.

## 2011-08-29 NOTE — ED Provider Notes (Signed)
Agree with above note.  Meredith Howard 08/29/2011 10:02 AM

## 2011-08-29 NOTE — ED Provider Notes (Signed)
History     Chief Complaint  Patient presents with  . Abdominal Pain   HPI Meredith Howard 30 y.o. with LMP 06-23-11 comes to [facility]U with abdominal pain.  Pain is in upper and lower abdomen.  Radiates to back.  Had an Ultrasound on 08-02-11 which showed an IUP with yolk sac.  Has an appointment to begin prenatal care on Jan 3.  Does not remember provider's name.  Significant history of fibromyalgia, gastritis, and interstitial cystitis.   OB History    Grav Para Term Preterm Abortions TAB SAB Ect Mult Living   1 0 0 0 0 0 0 0 0 0       Past Medical History  Diagnosis Date  . Neuromuscular disorder     fibromyalagia  . Back pain   . Gastritis   . Interstitial cystitis   . Endometrial cyst of ovary   . Anxiety     pt states she experiences panic attacks  . GERD (gastroesophageal reflux disease)   . Migraine   . Hernia, hiatal   . Fibroid   . Panic attack     Past Surgical History  Procedure Date  . Laparoscopy 06/18/2011    Procedure: LAPAROSCOPY DIAGNOSTIC;  Surgeon: Hollie Salk C. Marice Potter, MD;  Location: [facility];  Service: Gynecology;  Laterality: Bilateral;  with bilateral chromopertubation with excision of endometrial biopsy    Family History  Problem Relation Age of Onset  . Fibroids Mother   . Hypertension Mother   . Hypertension Father   . Diabetes Father   . Kidney disease Father     kidney stones  . Gout Father     History  Substance Use Topics  . Smoking status: Never Smoker   . Smokeless tobacco: Never Used  . Alcohol Use: No    Allergies:  Allergies  Allergen Reactions  . Percocet (Oxycodone-Acetaminophen) Itching    Prescriptions prior to admission  Medication Sig Dispense Refill  . calcium carbonate (TUMS - DOSED IN MG ELEMENTAL CALCIUM) 500 MG chewable tablet Chew 1 tablet by mouth daily as needed. For heartburn       . Camphor-Eucalyptus-Menthol (VICKS VAPORUB EX) Apply 1 application topically daily as needed. For stuffy nose       . Prenatal Vit-Fe  Fumarate-FA (PRENATAL MULTIVITAMIN) TABS Take 1 tablet by mouth daily.        . ranitidine (ZANTAC) 150 MG tablet Take 150 mg by mouth daily as needed. For heartburn        Review of Systems  Gastrointestinal: Positive for abdominal pain. Negative for nausea, vomiting, diarrhea and constipation.  Genitourinary: Positive for urgency. Negative for dysuria.  Musculoskeletal: Positive for back pain.   Physical Exam   Blood pressure 118/65, pulse 97, temperature 98.5 F (36.9 C), resp. rate 16, height 5\' 6"  (1.676 m), weight 147 lb (66.679 kg), last menstrual period 06/23/2011.  Physical Exam  Nursing note and vitals reviewed. Constitutional: She is oriented to person, place, and time. She appears well-developed and well-nourished.  HENT:  Head: Normocephalic.  Eyes: EOM are normal.  Neck: Neck supple.  GI: Soft. There is tenderness. There is no rebound and no guarding.       Most tender near umbilicus,  Also tender in upper quadrants and in lower mid abdomen.  Abdomen rounded.  Unable to firmly palpate due to pain, but fundus seems to be just under umbilicus.  Size not equal to dates.  Musculoskeletal: Normal range of motion.  Neurological: She is alert  and oriented to person, place, and time.  Skin: Skin is warm and dry.  Psychiatric: She has a normal mood and affect.    MAU Course  Procedures Results for orders placed during the hospital encounter of 08/29/11 (from the past 24 hour(s))  URINALYSIS, ROUTINE W REFLEX MICROSCOPIC     Status: Abnormal   Collection Time   08/29/11  7:39 AM      Component Value Range   Color, Urine YELLOW  YELLOW    APPearance CLEAR  CLEAR    Specific Gravity, Urine <1.005 (*) 1.005 - 1.030    pH 6.0  5.0 - 8.0    Glucose, UA NEGATIVE  NEGATIVE (mg/dL)   Hgb urine dipstick NEGATIVE  NEGATIVE    Bilirubin Urine NEGATIVE  NEGATIVE    Ketones, ur NEGATIVE  NEGATIVE (mg/dL)   Protein, ur NEGATIVE  NEGATIVE (mg/dL)   Urobilinogen, UA 0.2  0.0 - 1.0  (mg/dL)   Nitrite NEGATIVE  NEGATIVE    Leukocytes, UA NEGATIVE  NEGATIVE   CBC     Status: Abnormal   Collection Time   08/29/11  8:13 AM      Component Value Range   WBC 4.6  4.0 - 10.5 (K/uL)   RBC 3.68 (*) 3.87 - 5.11 (MIL/uL)   Hemoglobin 10.5 (*) 12.0 - 15.0 (g/dL)   HCT 40.9 (*) 81.1 - 46.0 (%)   MCV 85.6  78.0 - 100.0 (fL)   MCH 28.5  26.0 - 34.0 (pg)   MCHC 33.3  30.0 - 36.0 (g/dL)   RDW 91.4  78.2 - 95.6 (%)   Platelets 223  150 - 400 (K/uL)    MDM 9w 1d living IUP  Assessment and Plan  abd pain in pregnancy - unknown cause  Plan Take Tylenol 325 mg 2 tablets by mouth every 4 hours if needed for pain. Drink at least 8 8-oz glasses of water every day. Eat a high fiber diet to avoid constipation. Keep your appointment to begin prenatal care.  Alfio Loescher 08/29/2011, 8:14 AM   Nolene Bernheim, NP 08/29/11 [phone]

## 2011-08-29 NOTE — Progress Notes (Signed)
Pt does have burning and urgency with voiding.

## 2011-09-06 LAB — OB RESULTS CONSOLE PLATELET COUNT: Platelets: 278 10*3/uL

## 2011-09-06 LAB — OB RESULTS CONSOLE ANTIBODY SCREEN: Antibody Screen: NEGATIVE

## 2011-09-06 LAB — OB RESULTS CONSOLE HGB/HCT, BLOOD
HCT: 36 %
Hemoglobin: 11.8 g/dL

## 2011-09-06 LAB — OB RESULTS CONSOLE RPR: RPR: NONREACTIVE

## 2011-09-06 LAB — OB RESULTS CONSOLE VARICELLA ZOSTER ANTIBODY, IGG: Varicella: IMMUNE

## 2011-09-20 LAB — OB RESULTS CONSOLE GC/CHLAMYDIA
Chlamydia: NEGATIVE
Gonorrhea: NEGATIVE

## 2011-09-26 ENCOUNTER — Ambulatory Visit: Payer: Medicaid Other | Attending: Obstetrics and Gynecology

## 2011-09-26 DIAGNOSIS — IMO0001 Reserved for inherently not codable concepts without codable children: Secondary | ICD-10-CM | POA: Insufficient documentation

## 2011-09-26 DIAGNOSIS — R5381 Other malaise: Secondary | ICD-10-CM | POA: Insufficient documentation

## 2011-09-26 DIAGNOSIS — M25569 Pain in unspecified knee: Secondary | ICD-10-CM | POA: Insufficient documentation

## 2011-10-03 ENCOUNTER — Ambulatory Visit: Payer: Medicaid Other | Admitting: Physical Therapy

## 2011-10-07 ENCOUNTER — Other Ambulatory Visit: Payer: Self-pay | Admitting: Obstetrics and Gynecology

## 2011-10-07 ENCOUNTER — Encounter (HOSPITAL_COMMUNITY): Payer: Self-pay | Admitting: *Deleted

## 2011-10-07 ENCOUNTER — Inpatient Hospital Stay (HOSPITAL_COMMUNITY)
Admission: AD | Admit: 2011-10-07 | Discharge: 2011-10-07 | Disposition: A | Discharge status: Home / Self Care | Payer: Medicaid Other | Source: Ambulatory Visit | Attending: Obstetrics and Gynecology | Admitting: Obstetrics and Gynecology

## 2011-10-07 ENCOUNTER — Other Ambulatory Visit (HOSPITAL_COMMUNITY): Payer: Self-pay | Admitting: *Deleted

## 2011-10-07 DIAGNOSIS — R51 Headache: Secondary | ICD-10-CM | POA: Insufficient documentation

## 2011-10-07 DIAGNOSIS — H9209 Otalgia, unspecified ear: Secondary | ICD-10-CM | POA: Insufficient documentation

## 2011-10-07 DIAGNOSIS — O9989 Other specified diseases and conditions complicating pregnancy, childbirth and the puerperium: Secondary | ICD-10-CM | POA: Insufficient documentation

## 2011-10-07 DIAGNOSIS — O99891 Other specified diseases and conditions complicating pregnancy: Secondary | ICD-10-CM | POA: Insufficient documentation

## 2011-10-07 DIAGNOSIS — H6690 Otitis media, unspecified, unspecified ear: Secondary | ICD-10-CM

## 2011-10-07 DIAGNOSIS — O210 Mild hyperemesis gravidarum: Secondary | ICD-10-CM | POA: Insufficient documentation

## 2011-10-07 DIAGNOSIS — Z3400 Encounter for supervision of normal first pregnancy, unspecified trimester: Secondary | ICD-10-CM

## 2011-10-07 DIAGNOSIS — R109 Unspecified abdominal pain: Secondary | ICD-10-CM | POA: Insufficient documentation

## 2011-10-07 DIAGNOSIS — F329 Major depressive disorder, single episode, unspecified: Secondary | ICD-10-CM

## 2011-10-07 DIAGNOSIS — R110 Nausea: Secondary | ICD-10-CM

## 2011-10-07 HISTORY — DX: Depression, unspecified: F32.A

## 2011-10-07 HISTORY — DX: Major depressive disorder, single episode, unspecified: F32.9

## 2011-10-07 LAB — URINALYSIS, ROUTINE W REFLEX MICROSCOPIC
Bilirubin Urine: NEGATIVE
Glucose, UA: NEGATIVE mg/dL
Hgb urine dipstick: NEGATIVE
Ketones, ur: NEGATIVE mg/dL
Protein, ur: NEGATIVE mg/dL

## 2011-10-07 LAB — CBC
Hemoglobin: 10.7 g/dL — ABNORMAL LOW (ref 12.0–15.0)
MCH: 28.8 pg (ref 26.0–34.0)
MCV: 86.8 fL (ref 78.0–100.0)
Platelets: 219 10*3/uL (ref 150–400)
RBC: 3.72 MIL/uL — ABNORMAL LOW (ref 3.87–5.11)
WBC: 5.8 10*3/uL (ref 4.0–10.5)

## 2011-10-07 LAB — COMPREHENSIVE METABOLIC PANEL
ALT: 7 U/L (ref 0–35)
Alkaline Phosphatase: 50 U/L (ref 39–117)
BUN: 4 mg/dL — ABNORMAL LOW (ref 6–23)
CO2: 28 mEq/L (ref 19–32)
GFR calc Af Amer: >90 mL/min (ref >90)
GFR calc non Af Amer: >90 mL/min (ref >90)
Glucose, Bld: 81 mg/dL (ref 70–99)
Potassium: 4.3 mEq/L (ref 3.5–5.1)
Sodium: 135 mEq/L (ref 135–145)

## 2011-10-07 LAB — DIFFERENTIAL
Eosinophils Relative: 1 % (ref 0–5)
Lymphocytes Relative: 24 % (ref 12–46)
Lymphs Abs: 1.4 10*3/uL (ref 0.7–4.0)
Monocytes Relative: 9 % (ref 3–12)
Neutrophils Relative %: 66 % (ref 43–77)

## 2011-10-07 LAB — AMYLASE: Amylase: 101 U/L (ref 0–105)

## 2011-10-07 MED ORDER — LACTATED RINGERS IV BOLUS (SEPSIS)
500.0000 mL | Freq: Once | INTRAVENOUS | Status: AC
  Administered 2011-10-07: 1000 mL via INTRAVENOUS

## 2011-10-07 MED ORDER — LACTATED RINGERS IV SOLN
INTRAVENOUS | Status: DC
  Administered 2011-10-07: 12:00:00 via INTRAVENOUS

## 2011-10-07 MED ORDER — IBUPROFEN 600 MG PO TABS: 600.0000 mg | ORAL_TABLET | Freq: Four times a day (QID) | ORAL | Status: DC

## 2011-10-07 MED ORDER — SERTRALINE HCL 25 MG PO TABS: 25.0000 mg | ORAL_TABLET | Freq: Every day | ORAL | Status: DC

## 2011-10-07 MED ORDER — ONDANSETRON HCL 4 MG/2ML IJ SOLN
4.0000 mg | Freq: Once | INTRAMUSCULAR | Status: AC
  Administered 2011-10-07: 4 mg via INTRAVENOUS
  Filled 2011-10-07: qty 2

## 2011-10-07 MED ORDER — PROMETHAZINE HCL 12.5 MG PO TABS: 25.0000 mg | ORAL_TABLET | Freq: Four times a day (QID) | ORAL | Status: DC | PRN

## 2011-10-07 MED ORDER — AZITHROMYCIN 250 MG PO TABS: ORAL_TABLET | ORAL | Status: DC

## 2011-10-07 NOTE — ED Notes (Signed)
C/o various issues; having on-going N&V (primarily in the AM) for past 2 weeks; c/o on-going migraines (everyday for past 2 weeks)- today the headache is on the R side of her face and head with an earache also on the R; c/o sharp pain in lower pelvis, increase N&V since last night at supper time; c/o feeling sad and trouble sleeping at night; c/o upper back pain; c/o generally "just not feeling well";

## 2011-10-07 NOTE — Progress Notes (Signed)
Pt reports she ate a chicken sandwich last night and went to bed and felt nausea and had a headache. Pt reports she felt the same this morning and vomited a small amount. Also reports feeling dizzy on and off as well.

## 2011-10-07 NOTE — Progress Notes (Addendum)
History   31 yo G1P0 at 69 1/7 weeks presenting c/o nausea/vomiting, headache, right ear pain, upper and lower abdominal cramping, depression, and occasionally seeing red spots in left eye--all of these (except the ear pain) are issues the patient has noted throughout pregnancy, but felt the nausea and abdominal pain has been worse since eating chicken sandwich last night.  Vomited x 2 since then.  Denies fever, dysuria, discharge, bleeding, exposure to viral syndrome, or any other issue.  Seen last week, no issues.  Denies recent IC.  No constipation.  Denies SI or HI.  Pregnancy remarkable for: Fibromyalgia Migraines with visual aura/spots Fibroids in past Interstitial cystitis Depression/Panic/Anxiety in past, on Cymbalta in past (none recently) Gastritis/GERD--on Zantac     OB History    Grav Para Term Preterm Abortions TAB SAB Ect Mult Living   1 0 0 0 0 0 0 0 0 0       Past Medical History  Diagnosis Date  . Neuromuscular disorder     fibromyalagia  . Back pain   . Gastritis   . Interstitial cystitis   . Endometrial cyst of ovary   . Anxiety     pt states she experiences panic attacks  . GERD (gastroesophageal reflux disease)   . Migraine   . Hernia, hiatal   . Fibroid   . Panic attack   . Depression     Past Surgical History  Procedure Date  . Laparoscopy 06/18/2011    Procedure: LAPAROSCOPY DIAGNOSTIC;  Surgeon: Hollie Salk C. Marice Potter, MD;  Location: [facility];  Service: Gynecology;  Laterality: Bilateral;  with bilateral chromopertubation with excision of endometrial biopsy    Family History  Problem Relation Age of Onset  . Fibroids Mother   . Hypertension Mother   . Hypertension Father   . Diabetes Father   . Kidney disease Father     kidney stones  . Gout Father     History  Substance Use Topics  . Smoking status: Never Smoker   . Smokeless tobacco: Never Used  . Alcohol Use: No    Allergies:  Allergies  Allergen Reactions  . Percocet  (Oxycodone-Acetaminophen) Itching    Prescriptions prior to admission  Medication Sig Dispense Refill  . calcium carbonate (TUMS - DOSED IN MG ELEMENTAL CALCIUM) 500 MG chewable tablet Chew 1 tablet by mouth daily as needed. For heartburn       . Camphor-Eucalyptus-Menthol (VICKS VAPORUB EX) Apply 1 application topically daily as needed. For stuffy nose       . diphenhydramine-acetaminophen (TYLENOL PM) 25-500 MG TABS Take 1 tablet by mouth at bedtime as needed. For migraines      . ondansetron (ZOFRAN) 8 MG tablet Take by mouth every 8 (eight) hours as needed. nausea      . Prenatal Vit-Fe Fumarate-FA (PRENATAL MULTIVITAMIN) TABS Take 1 tablet by mouth daily.      . promethazine (PHENERGAN) 25 MG tablet Take 25 mg by mouth every 6 (six) hours as needed. nausea      . ranitidine (ZANTAC) 150 MG tablet Take 150 mg by mouth daily as needed. For heartburn         Physical Exam   Blood pressure 112/62, pulse 96, temperature 98.5 F (36.9 C), temperature source Oral, resp. rate 16, height 5' 6.5" (1.689 m), weight 66.951 kg (147 lb 9.6 oz), last menstrual period 06/23/2011, SpO2 99.00%.  Chest clear Heart RRR without murmur Ears--left ear clear, right ear canal red, with exudate in canal Mild  sinus pain on right side with palpation. Throat clear Abd--soft, positive bowel sounds in all quadrants, mild gaseous distension in upper abdomen.  No rebound or guarding. Uterus--gravid, NT, FHT 150s by doppler, FH approx 16 week size Pelvic--no discharge in vault, cervix closed and long, NT.  Results for orders placed during the hospital encounter of 10/07/11 (from the past 24 hour(s))  URINALYSIS, ROUTINE W REFLEX MICROSCOPIC     Status: Normal   Collection Time   10/07/11 10:05 AM      Component Value Range   Color, Urine YELLOW  YELLOW    APPearance CLEAR  CLEAR    Specific Gravity, Urine 1.015  1.005 - 1.030    pH 8.0  5.0 - 8.0    Glucose, UA NEGATIVE  NEGATIVE (mg/dL)   Hgb urine dipstick  NEGATIVE  NEGATIVE    Bilirubin Urine NEGATIVE  NEGATIVE    Ketones, ur NEGATIVE  NEGATIVE (mg/dL)   Protein, ur NEGATIVE  NEGATIVE (mg/dL)   Urobilinogen, UA 0.2  0.0 - 1.0 (mg/dL)   Nitrite NEGATIVE  NEGATIVE    Leukocytes, UA NEGATIVE  NEGATIVE   CBC     Status: Abnormal   Collection Time   10/07/11 11:14 AM      Component Value Range   WBC 5.8  4.0 - 10.5 (K/uL)   RBC 3.72 (*) 3.87 - 5.11 (MIL/uL)   Hemoglobin 10.7 (*) 12.0 - 15.0 (g/dL)   HCT 16.1 (*) 09.6 - 46.0 (%)   MCV 86.8  78.0 - 100.0 (fL)   MCH 28.8  26.0 - 34.0 (pg)   MCHC 33.1  30.0 - 36.0 (g/dL)   RDW 04.5  40.9 - 81.1 (%)   Platelets 219  150 - 400 (K/uL)  DIFFERENTIAL     Status: Normal   Collection Time   10/07/11 11:14 AM      Component Value Range   Neutrophils Relative 66  43 - 77 (%)   Neutro Abs 3.8  1.7 - 7.7 (K/uL)   Lymphocytes Relative 24  12 - 46 (%)   Lymphs Abs 1.4  0.7 - 4.0 (K/uL)   Monocytes Relative 9  3 - 12 (%)   Monocytes Absolute 0.5  0.1 - 1.0 (K/uL)   Eosinophils Relative 1  0 - 5 (%)   Eosinophils Absolute 0.0  0.0 - 0.7 (K/uL)   Basophils Relative 0  0 - 1 (%)   Basophils Absolute 0.0  0.0 - 0.1 (K/uL)  COMPREHENSIVE METABOLIC PANEL     Status: Abnormal   Collection Time   10/07/11 11:14 AM      Component Value Range   Sodium 135  135 - 145 (mEq/L)   Potassium 4.3  3.5 - 5.1 (mEq/L)   Chloride 99  96 - 112 (mEq/L)   CO2 28  19 - 32 (mEq/L)   Glucose, Bld 81  70 - 99 (mg/dL)   BUN 4 (*) 6 - 23 (mg/dL)   Creatinine, Ser 9.14  0.50 - 1.10 (mg/dL)   Calcium 9.1  8.4 - 78.2 (mg/dL)   Total Protein 6.6  6.0 - 8.3 (g/dL)   Albumin 3.3 (*) 3.5 - 5.2 (g/dL)   AST 10  0 - 37 (U/L)   ALT 7  0 - 35 (U/L)   Alkaline Phosphatase 50  39 - 117 (U/L)   Total Bilirubin 0.3  0.3 - 1.2 (mg/dL)   GFR calc non Af Amer >90  >90 (mL/min)   GFR calc Af Amer >  90  >90 (mL/min)  AMYLASE     Status: Normal   Collection Time   10/07/11 11:14 AM      Component Value Range   Amylase 101  0 - 105 (U/L)    LIPASE, BLOOD     Status: Normal   Collection Time   10/07/11 11:14 AM      Component Value Range   Lipase 23  11 - 59 (U/L)   Received 1 1/2 bags IVF with Zofran 4 mg IV x 1 Feeling some better.   ED Course  IUP at 15 1/7 weeks Right ear infection, likely leading to headache Generalized malaise Hx depression/anxiety/panic--no acute situation, chronic issues, now pregnant  Plan: D/C home with following Rxs: Zpak for ear infection Ibuprophen 600 mg po q 6 hour x 2-3 days, then prn headache, cramping, ear pain. Zoloft 25 mg po--take 1 tablet q day x 7 days, then 2 po q day. Phenergan 25 mg po q 6 hours prn (had suppository Rx, unable to tolerate)  Will follow-up with office as scheduled. Office will work to refer to neurologist for evaluation of headaches and visual issues related.  Nigel Bridgeman, CNM, [place] 10/07/11 1:50pm

## 2011-10-08 LAB — URINE CULTURE
Colony Count: NO GROWTH
Culture: NO GROWTH

## 2011-10-10 ENCOUNTER — Inpatient Hospital Stay (HOSPITAL_COMMUNITY)
Admission: AD | Admit: 2011-10-10 | Discharge: 2011-10-10 | Disposition: A | Discharge status: Home / Self Care | Payer: Medicaid Other | Source: Ambulatory Visit | Attending: Obstetrics and Gynecology | Admitting: Obstetrics and Gynecology

## 2011-10-10 DIAGNOSIS — O99019 Anemia complicating pregnancy, unspecified trimester: Secondary | ICD-10-CM | POA: Insufficient documentation

## 2011-10-10 DIAGNOSIS — D649 Anemia, unspecified: Secondary | ICD-10-CM | POA: Insufficient documentation

## 2011-10-10 DIAGNOSIS — O209 Hemorrhage in early pregnancy, unspecified: Secondary | ICD-10-CM

## 2011-10-10 DIAGNOSIS — J069 Acute upper respiratory infection, unspecified: Secondary | ICD-10-CM | POA: Insufficient documentation

## 2011-10-10 DIAGNOSIS — O99891 Other specified diseases and conditions complicating pregnancy: Secondary | ICD-10-CM | POA: Insufficient documentation

## 2011-10-10 LAB — URINALYSIS, ROUTINE W REFLEX MICROSCOPIC
Bilirubin Urine: NEGATIVE
Glucose, UA: NEGATIVE mg/dL
Nitrite: NEGATIVE
Specific Gravity, Urine: >1.03 — ABNORMAL HIGH (ref 1.005–1.030)
pH: 6 (ref 5.0–8.0)

## 2011-10-10 LAB — URINE MICROSCOPIC-ADD ON

## 2011-10-10 LAB — WET PREP, GENITAL
Trich, Wet Prep: NONE SEEN
Yeast Wet Prep HPF POC: NONE SEEN

## 2011-10-10 NOTE — ED Notes (Signed)
Pt reports pain with urination this evening.

## 2011-10-10 NOTE — Progress Notes (Signed)
Pt G1 at 15.4wks had vag bleeding after having a bowel movement ago.  Pt denies problems with pregnancy.

## 2011-10-10 NOTE — ED Provider Notes (Signed)
History   Meredith Howard is a 30y.o. Black female primagravida at 15.4 weeks per [facility] 03/29/12 who presents for eval of vaginal bleeding noted when having a bowel movement around 1900.  Pt denies prior vaginal bleeding thus far in the pregnancy.  No recent intercourse.  Pt did not wear panty liner to MAU, and reports no blood on her underwear since left home.  Does have IBS, but no h/o hemorrhoids or rectal bleeding.  Pain w/ urination this evening, but urine cx on 10/07/11 negative.  Pt seen in MAU 10/07/11 for n/v, HA, and UR s/s, and is currently taking azithromycin for sinusitis/ear infection.  Has promethazine for intermittent nausea; none today.  Pt has upcoming appt at [facility] 10/18/11.  She had 1st trimester screen about 2 weeks ago and was her last u/s and told placenta was "at the top of her uterus."  Has had occ'l lower abdominal cramping in the pregnancy and some today as well; she was instructed over the weekend to take Ibuprofen for 2-3 days for this.  Labwork 10/07/11 WNL except low Hgb.  Pt is currently applying for disability secondary to fibromyalgia.  Of note, pt also reports that she has in the past, placed Phenergan pills in vagina b/c she thought she was told that was ok.    Pregnancy remarkable for:  Fibromyalgia--dx'd 2009 Migraines with visual aura/spots  Fibroids in past  Interstitial cystitis  Depression/Panic/Anxiety in past, on Cymbalta in past (none recently)  Gastritis/GERD--on Zantac  H/o endometriosis H/o Trich H/o anemia scoliosis   Chief Complaint  Patient presents with  . Vaginal Bleeding   HPI  OB History    Grav Para Term Preterm Abortions TAB SAB Ect Mult Living   1 0 0 0 0 0 0 0 0 0       Past Medical History  Diagnosis Date  . Neuromuscular disorder     fibromyalagia  . Back pain   . Gastritis   . Interstitial cystitis   . Endometrial cyst of ovary   . Anxiety     pt states she experiences panic attacks  . GERD (gastroesophageal reflux disease)   .  Migraine   . Hernia, hiatal   . Fibroid   . Panic attack   . Depression     Past Surgical History  Procedure Date  . Laparoscopy 06/18/2011    Procedure: LAPAROSCOPY DIAGNOSTIC;  Surgeon: Hollie Salk C. Marice Potter, MD;  Location: [facility];  Service: Gynecology;  Laterality: Bilateral;  with bilateral chromopertubation with excision of endometrial biopsy    Family History  Problem Relation Age of Onset  . Fibroids Mother   . Hypertension Mother   . Hypertension Father   . Diabetes Father   . Kidney disease Father     kidney stones  . Gout Father     History  Substance Use Topics  . Smoking status: Never Smoker   . Smokeless tobacco: Never Used  . Alcohol Use: No    Allergies:  Allergies  Allergen Reactions  . Percocet (Oxycodone-Acetaminophen) Itching    Prescriptions prior to admission  Medication Sig Dispense Refill  . azithromycin (ZITHROMAX Z-PAK) 250 MG tablet Take 2 tabs initially, then 1 tab every day for total of 5 days.  6 each  0  . calcium carbonate (TUMS - DOSED IN MG ELEMENTAL CALCIUM) 500 MG chewable tablet Chew 1 tablet by mouth daily as needed. For heartburn       . Camphor-Eucalyptus-Menthol (VICKS VAPORUB EX) Apply 1 application topically  daily as needed. For stuffy nose       . diphenhydramine-acetaminophen (TYLENOL PM) 25-500 MG TABS Take 1 tablet by mouth at bedtime as needed. For migraines      . ibuprofen (ADVIL,MOTRIN) 600 MG tablet Take 1 tablet (600 mg total) by mouth every 6 (six) hours. Take regularly every 6 hours for 2-3 days, then as needed.  30 tablet  2  . ondansetron (ZOFRAN) 8 MG tablet Take by mouth every 8 (eight) hours as needed. nausea      . Prenatal Vit-Fe Fumarate-FA (PRENATAL MULTIVITAMIN) TABS Take 1 tablet by mouth daily.      . promethazine (PHENERGAN) 12.5 MG tablet Take 2 tablets (25 mg total) by mouth every 6 (six) hours as needed for nausea.  30 tablet  2  . promethazine (PHENERGAN) 25 MG tablet Take 25 mg by mouth every 6 (six) hours  as needed. nausea      . ranitidine (ZANTAC) 150 MG tablet Take 150 mg by mouth daily as needed. For heartburn      . sertraline (ZOLOFT) 25 MG tablet Take 1 tablet (25 mg total) by mouth daily. Start with 1 tablet every day x 1 week, then increase to 2 tablets once a day.  50 tablet  2    ROS--see history above. Physical Exam   Blood pressure 113/73, pulse 99, temperature 99.1 F (37.3 C), temperature source Oral, resp. rate 16, height 5' 6.5" (1.689 m), weight 67.677 kg (149 lb 3.2 oz), last menstrual period 06/23/2011.  Physical Exam  Constitutional: She is oriented to person, place, and time. She appears well-developed and well-nourished. No distress.  Cardiovascular: Normal rate.   Respiratory: Effort normal.  GI: Soft. There is tenderness. There is guarding.       When feeling fundus right below umbilicus, pt guarded abdomen and flinched and stated "I have a hernia."  Genitourinary:       sm amt of brown tissue attached to rt sidewall of vagina noted as Spec inserted; as blades further opened, active bleeding noted, and may have dislodged the brown tissue. *suspect healing laceration or polyp* Cx was pink smooth, and scant amt of yellowish mucous at os.  Cx closed/long. No blood at introitus or on vulva or perineum or anus.  Neurological: She is alert and oriented to person, place, and time.  Skin: Skin is warm and dry.   .. Results for orders placed during the hospital encounter of 10/10/11 (from the past 24 hour(s))  URINALYSIS, ROUTINE W REFLEX MICROSCOPIC     Status: Abnormal   Collection Time   10/10/11  7:51 PM      Component Value Range   Color, Urine YELLOW  YELLOW    APPearance HAZY (*) CLEAR    Specific Gravity, Urine >1.030 (*) 1.005 - 1.030    pH 6.0  5.0 - 8.0    Glucose, UA NEGATIVE  NEGATIVE (mg/dL)   Hgb urine dipstick SMALL (*) NEGATIVE    Bilirubin Urine NEGATIVE  NEGATIVE    Ketones, ur NEGATIVE  NEGATIVE (mg/dL)   Protein, ur NEGATIVE  NEGATIVE (mg/dL)     Urobilinogen, UA 0.2  0.0 - 1.0 (mg/dL)   Nitrite NEGATIVE  NEGATIVE    Leukocytes, UA NEGATIVE  NEGATIVE   URINE MICROSCOPIC-ADD ON     Status: Abnormal   Collection Time   10/10/11  7:51 PM      Component Value Range   Squamous Epithelial / LPF MANY (*) RARE  WBC, UA 0-2  <3 (WBC/hpf)   Bacteria, UA RARE  RARE    Urine-Other MUCOUS PRESENT    WET PREP, GENITAL     Status: Abnormal   Collection Time   10/10/11  8:05 PM      Component Value Range   Yeast Wet Prep HPF POC NONE SEEN  NONE SEEN    Trich, Wet Prep NONE SEEN  NONE SEEN    Clue Cells Wet Prep HPF POC NONE SEEN  NONE SEEN    WBC, Wet Prep HPF POC RARE (*) NONE SEEN    MAU Course  Procedures 1.  FHT's=155 per doppler 2.  SSE exam 3.  Wet prep 4.  U/a 5.  Gc/ct cx  Assessment and Plan  1.  IUP at 15.4 2.  Sm amt of Vaginal bleeding and suspect very small tear Rt vaginal sidewall or polyp 3.  Rh pos 4.  Multiple comorbidities 5.  Current treatment for URI/otitis media 6.  Anemia per Hgb 10/07/11, and not discussed w/ pt   1.  D/c home w/ bleeding precautions; pelvic rest until 7 consecutive days w/o bleeding 2.  F/u as scheduled or prn 3.  Rec'd Floridex qd and a daily probiotic 4.  Gc/ct pending result   Melat Wrisley H 10/10/2011, 8:31 PM

## 2011-10-11 ENCOUNTER — Ambulatory Visit: Payer: Medicaid Other | Attending: Obstetrics and Gynecology | Admitting: Physical Therapy

## 2011-10-11 DIAGNOSIS — R5381 Other malaise: Secondary | ICD-10-CM | POA: Insufficient documentation

## 2011-10-11 DIAGNOSIS — IMO0001 Reserved for inherently not codable concepts without codable children: Secondary | ICD-10-CM | POA: Insufficient documentation

## 2011-10-11 DIAGNOSIS — M25569 Pain in unspecified knee: Secondary | ICD-10-CM | POA: Insufficient documentation

## 2011-10-11 LAB — GC/CHLAMYDIA PROBE AMP, GENITAL: Chlamydia, DNA Probe: NEGATIVE

## 2011-10-17 ENCOUNTER — Ambulatory Visit: Payer: Medicaid Other | Admitting: Physical Therapy

## 2011-11-07 ENCOUNTER — Other Ambulatory Visit: Payer: Medicaid Other

## 2011-11-07 ENCOUNTER — Encounter (INDEPENDENT_AMBULATORY_CARE_PROVIDER_SITE_OTHER): Payer: Medicaid Other | Admitting: Obstetrics and Gynecology

## 2011-11-07 DIAGNOSIS — Z331 Pregnant state, incidental: Secondary | ICD-10-CM

## 2011-12-05 ENCOUNTER — Encounter: Payer: Medicaid Other | Admitting: Obstetrics and Gynecology

## 2011-12-06 ENCOUNTER — Encounter (INDEPENDENT_AMBULATORY_CARE_PROVIDER_SITE_OTHER): Payer: Medicaid Other | Admitting: Registered Nurse

## 2011-12-06 DIAGNOSIS — Z331 Pregnant state, incidental: Secondary | ICD-10-CM

## 2011-12-11 ENCOUNTER — Telehealth: Payer: Self-pay | Admitting: Obstetrics and Gynecology

## 2011-12-13 ENCOUNTER — Telehealth: Payer: Self-pay

## 2011-12-13 DIAGNOSIS — D229 Melanocytic nevi, unspecified: Secondary | ICD-10-CM

## 2011-12-13 NOTE — Telephone Encounter (Signed)
Spoke to pt informed her Zyrtec is offered OTC & the generic is Certirizine. Also f/u with her concerning her moles. Pt states mole is itchy & very irritated. Informed pt per CB we will refer her to a dermatologist. Pt agrees and understands.

## 2011-12-20 ENCOUNTER — Telehealth: Payer: Self-pay | Admitting: Obstetrics and Gynecology

## 2011-12-20 MED ORDER — PANTOPRAZOLE SODIUM 40 MG PO TBEC: 40.0000 mg | DELAYED_RELEASE_TABLET | Freq: Two times a day (BID) | ORAL | Status: DC

## 2011-12-20 NOTE — Telephone Encounter (Signed)
TC TO PT, PT LM STATING SHE WOULD LIKE TO HAVE A GI REFERRAL INSTEAD OF TAKING MEDS RX'D. LM ON VM TO CALL BACK.

## 2011-12-20 NOTE — Telephone Encounter (Signed)
Pt concern being addressed by K. Marina Goodell.

## 2011-12-20 NOTE — Telephone Encounter (Signed)
PT RTND CALL, INFORED PER [facility], WOULD LIKE HER TO TRY WITH THE PROTONIX FIRST B/C IF WE WERE TO MAKE A GI REFERRAL, THEY WOULD ASK THIS OFFICE IF WE HAD EXHAUSTED ALL MEASURES TO TREAT ABD PAIN, REASSURED PT PROTONIX IS CAT. B MED AND IS SAFE IN PREGNANCY, PT VOICES UNDERSTANDING, WILL TRY PROTONIX FOR NOW.  PT TO CALL OFFICE WITH ANY FURTHER CONCERNS OR NO RELIEF.

## 2011-12-20 NOTE — Telephone Encounter (Signed)
PT CALLED IS 25WKS 5DAYS, FOR PAST WK HAS BEEN C/O SEVERE ABD PAIN LOCATED IN UPPER MIDDLE PART OF ABD.  GETS PAIN WITH EATING, HAS VOMITING Q AM, IS A YELLOW/WHITE MUCUS, HAS HX OF GASTRITIS, GERD, TAKES ZANTAC WITH MINIMAL RELIEF, DOES HAVE +FM, ALSO  HAS SEVERE MIGRAINES IN AM, HAS NEUROLOGIST THAT RX'D MED BUT DOES NOT LIKE THE WAY IT MAKES HER FEEL. PER [facility] CAN RX PT PROTONIX AND CAN TAKE IBUPROFEN 600MG  Q6 HRS FOR MIGRAINES FOR NOW, PT TO CALL BACK IF NO RELIEF OR ANY CONCERNS.

## 2011-12-28 ENCOUNTER — Telehealth: Payer: Self-pay | Admitting: Obstetrics and Gynecology

## 2011-12-28 NOTE — Telephone Encounter (Signed)
Spoke with pt rgd concerns pt states feeling weak,exhausted,dizzy, pain in left hand headache, back pain advised pt per [facility] she can com to MAU for eval or try increasing water intake, eating some food wit protein, and take  tylenol for headache and back pain pt states  unable to go to MAU until 5pm when husband gets off work advised pt to try recommendation if not better call office pt voice understanding.

## 2011-12-28 NOTE — Telephone Encounter (Signed)
Routed to niccole 

## 2012-01-09 ENCOUNTER — Encounter: Payer: Self-pay | Admitting: Obstetrics and Gynecology

## 2012-01-09 DIAGNOSIS — F410 Panic disorder [episodic paroxysmal anxiety] without agoraphobia: Secondary | ICD-10-CM

## 2012-01-09 DIAGNOSIS — M797 Fibromyalgia: Secondary | ICD-10-CM | POA: Insufficient documentation

## 2012-01-09 DIAGNOSIS — G43909 Migraine, unspecified, not intractable, without status migrainosus: Secondary | ICD-10-CM

## 2012-01-09 DIAGNOSIS — F329 Major depressive disorder, single episode, unspecified: Secondary | ICD-10-CM

## 2012-01-09 DIAGNOSIS — D219 Benign neoplasm of connective and other soft tissue, unspecified: Secondary | ICD-10-CM | POA: Insufficient documentation

## 2012-01-09 HISTORY — DX: Panic disorder (episodic paroxysmal anxiety): F41.0

## 2012-01-10 ENCOUNTER — Encounter: Payer: Self-pay | Admitting: Obstetrics and Gynecology

## 2012-01-10 ENCOUNTER — Ambulatory Visit (INDEPENDENT_AMBULATORY_CARE_PROVIDER_SITE_OTHER): Payer: Medicaid Other | Admitting: Obstetrics and Gynecology

## 2012-01-10 VITALS — BP 110/60 | Wt 168.0 lb

## 2012-01-10 DIAGNOSIS — Z331 Pregnant state, incidental: Secondary | ICD-10-CM

## 2012-01-10 NOTE — Progress Notes (Addendum)
Although reported "severe back pain and migraines", patient appears in NAD. Reviewed status--not taking preventive Pinadol for migraines as Rx'd by neuro.  Doesn't want to take any medication until after delivery. "Has learned to cope" with migraines and back pain, has hx fibromyalgia. Had some swelling of feet after traveling to [place], but now resolved. Glucola, HGB, RPR today.  A+ type RTO in 2 weeks.

## 2012-01-10 NOTE — Progress Notes (Signed)
C/o swollen feet after trip to [place] swelling decreased yesterday C/o severe back pain C/o awaking every morning with a migraine  1 gtt given today without difficulty

## 2012-01-11 ENCOUNTER — Encounter (HOSPITAL_COMMUNITY): Payer: Self-pay | Admitting: *Deleted

## 2012-01-11 ENCOUNTER — Telehealth: Payer: Self-pay | Admitting: Obstetrics and Gynecology

## 2012-01-11 ENCOUNTER — Inpatient Hospital Stay (HOSPITAL_COMMUNITY): Payer: Medicaid Other

## 2012-01-11 ENCOUNTER — Other Ambulatory Visit: Payer: Self-pay | Admitting: Obstetrics and Gynecology

## 2012-01-11 ENCOUNTER — Inpatient Hospital Stay (HOSPITAL_COMMUNITY)
Admission: AD | Admit: 2012-01-11 | Discharge: 2012-01-11 | Disposition: A | Discharge status: Home / Self Care | Payer: Medicaid Other | Source: Ambulatory Visit | Attending: Obstetrics and Gynecology | Admitting: Obstetrics and Gynecology

## 2012-01-11 DIAGNOSIS — N949 Unspecified condition associated with female genital organs and menstrual cycle: Secondary | ICD-10-CM | POA: Insufficient documentation

## 2012-01-11 DIAGNOSIS — O4700 False labor before 37 completed weeks of gestation, unspecified trimester: Secondary | ICD-10-CM | POA: Insufficient documentation

## 2012-01-11 DIAGNOSIS — Z331 Pregnant state, incidental: Secondary | ICD-10-CM

## 2012-01-11 LAB — WET PREP, GENITAL

## 2012-01-11 LAB — URINE MICROSCOPIC-ADD ON

## 2012-01-11 LAB — URINALYSIS, ROUTINE W REFLEX MICROSCOPIC
Bilirubin Urine: NEGATIVE
Glucose, UA: NEGATIVE mg/dL
Ketones, ur: NEGATIVE mg/dL
Protein, ur: NEGATIVE mg/dL

## 2012-01-11 LAB — GLUCOSE TOLERANCE, 1 HOUR: Glucose, 1 Hour GTT: 99 mg/dL (ref 70–140)

## 2012-01-11 MED ORDER — METRONIDAZOLE 500 MG PO TABS: 500.0000 mg | ORAL_TABLET | Freq: Two times a day (BID) | ORAL | Status: DC

## 2012-01-11 MED ORDER — NITROFURANTOIN MONOHYD MACRO 100 MG PO CAPS: 100.0000 mg | ORAL_CAPSULE | Freq: Two times a day (BID) | ORAL | Status: DC

## 2012-01-11 NOTE — MAU Note (Signed)
Pt states she has had abd pains since this am around 0830.  Pt also C/O sharp pains in lower back.  Denies vaginal bleeding and ROM.  Pt says she has had an increase in vaginal discharge.

## 2012-01-11 NOTE — Discharge Instructions (Signed)

## 2012-01-11 NOTE — Telephone Encounter (Signed)
TC, c/o pelvic pain, instructed to come to MAU

## 2012-01-11 NOTE — MAU Provider Note (Signed)
History     CSN: [id number]  Arrival date and time: 01/11/12 2051   First Provider Initiated Contact with Patient 01/11/12 2126      Chief Complaint  Patient presents with  . Labor Eval   HPI Comments: Pt is a 30yo G1P0 at [redacted]w[redacted]d that arrives with c/o pelvic pain that comes and goes and pain near umbilicus that comes and goes, unsure if ctx? States she's not been able to move around today because of the pain, she's had similar discomfort before but is more severe today. She denies any VB, LOF, +FM, c/o some milky d/c unsure of when that started.  Pregnancy significant for: 1. Fibromyalgia - dx'd 2009 2. Migraines with visual aura/spots 3. Fibroids in past 4. Interstitial cystitis  5, depression/panic/anxiety in past, has taken cymbalta none currently 6. Hx endometriosis 7. Hx trich 8. Hx anemia 9. Scoliosis       Past Medical History  Diagnosis Date  . Neuromuscular disorder     fibromyalagia  . Back pain   . Gastritis   . Interstitial cystitis   . Endometrial cyst of ovary   . Anxiety     pt states she experiences panic attacks  . GERD (gastroesophageal reflux disease)   . Migraine   . Hernia, hiatal   . Fibroid   . Panic attack   . Depression   . Panic attacks 01/09/2012  . Fibromyalgia     Past Surgical History  Procedure Date  . Laparoscopy 06/18/2011    Procedure: LAPAROSCOPY DIAGNOSTIC;  Surgeon: Hollie Salk C. Marice Potter, MD;  Location: [facility];  Service: Gynecology;  Laterality: Bilateral;  with bilateral chromopertubation with excision of endometrial biopsy  . Laparoscopic endometriosis fulguration 06/2011    Family History  Problem Relation Age of Onset  . Fibroids Mother   . Hypertension Mother   . Hypertension Father   . Diabetes Father   . Kidney disease Father     kidney stones  . Gout Father     History  Substance Use Topics  . Smoking status: Never Smoker   . Smokeless tobacco: Never Used  . Alcohol Use: No    Allergies:  Allergies    Allergen Reactions  . Percocet (Oxycodone-Acetaminophen) Itching    Prescriptions prior to admission  Medication Sig Dispense Refill  . calcium carbonate (TUMS - DOSED IN MG ELEMENTAL CALCIUM) 500 MG chewable tablet Chew 1 tablet by mouth daily as needed. For heartburn      . Camphor-Eucalyptus-Menthol (VICKS VAPORUB EX) Apply 1 application topically daily as needed. For stuffy nose       . diphenhydramine-acetaminophen (TYLENOL PM) 25-500 MG TABS Take 1 tablet by mouth at bedtime as needed. For migraines      . Ferrous Sulfate (FE TABS PO) Take by mouth daily.      . ondansetron (ZOFRAN) 8 MG tablet Take by mouth every 8 (eight) hours as needed. nausea      . pantoprazole (PROTONIX) 40 MG tablet Take 1 tablet (40 mg total) by mouth 2 (two) times daily.  60 tablet  4  . Prenatal Vit-Fe Fumarate-FA (PRENATAL MULTIVITAMIN) TABS Take 1 tablet by mouth daily.      . ranitidine (ZANTAC) 150 MG tablet Take 150 mg by mouth daily as needed. For heartburn        Review of Systems  Gastrointestinal: Positive for abdominal pain.       Intermittent pelvic, sometimes one side then the other, and at umbilicus.   All other  systems reviewed and are negative.   Physical Exam   Blood pressure 115/68, pulse 96, temperature 98.4 F (36.9 C), temperature source Oral, resp. rate 16, height 5' 6.5" (1.689 m), weight 167 lb 12.8 oz (76.114 kg), last menstrual period 06/23/2011, SpO2 100.00%.  Physical Exam  Nursing note and vitals reviewed. Constitutional: She is oriented to person, place, and time. She appears well-developed and well-nourished. No distress.       NAD, though after stepping away from bed, pt moaned and was rubbing her lower abdomen. I palpated at that time and abdomen was soft, seems associated with fetal movement  HENT:  Head: Normocephalic.  Neck: Normal range of motion.  Cardiovascular: Normal rate, regular rhythm and normal heart sounds.   Respiratory: Effort normal and breath  sounds normal.  GI: Soft. There is tenderness. There is guarding.       Gravid, AGA, soft, non-tender  Genitourinary: Vaginal discharge found.       Mod amt milky d/c no odor Cx=cl/th/high  Musculoskeletal: Normal range of motion. She exhibits no edema.  Neurological: She is alert and oriented to person, place, and time.  Skin: Skin is warm and dry.  Psychiatric: She has a normal mood and affect. Her behavior is normal. Judgment and thought content normal.  FHR - 140 non-reactive, mod variability, no decels toco - some irritability   MAU Course  Procedures Pelvic Ffn, gc/ct, wet prep, GBS US UA - sent for culture   Assessment and Plan  IUP at [redacted]w[redacted]d No s/s PTL [place] - BPP 8/8, growth 48% EFW=2lb13oz, AFI = 18.81cm/73%, otherwise normal FFN neg Wet prep - pos clue cells UA - pos bacteria  D/C home, PTL precautions, [facility] RX macrobid, flagyl Tylenol PO PRN F/U as scheduled in 2 wks Eain Mullendore M 01/11/2012, 9:27 PM

## 2012-01-13 ENCOUNTER — Telehealth: Payer: Self-pay | Admitting: Obstetrics and Gynecology

## 2012-01-13 ENCOUNTER — Other Ambulatory Visit: Payer: Self-pay | Admitting: Obstetrics and Gynecology

## 2012-01-13 NOTE — Telephone Encounter (Signed)
Pt called w c/o anxiety and depression. She declines wanting to take any medication, stated she just wanted to "talk to someone" She says anxiety has been worse at night w difficulty sleeping. Depression comes and goes. She has support system, husband supportive, other family members. Denies any SI/HI. Instructed pt to come immediately if SI/HI. Will put in referral to [facility] ASAP.

## 2012-01-14 LAB — CULTURE, BETA STREP (GROUP B ONLY)

## 2012-01-15 NOTE — Progress Notes (Signed)
Per SL pt need refer to [facility] for anxiety and depression unable to schd appt at [facility] due to insurance pt referred to [facility] records faxed they will contact pt with appt

## 2012-01-21 ENCOUNTER — Inpatient Hospital Stay (HOSPITAL_COMMUNITY): Payer: Medicaid Other

## 2012-01-21 ENCOUNTER — Telehealth: Payer: Self-pay | Admitting: Obstetrics and Gynecology

## 2012-01-21 ENCOUNTER — Ambulatory Visit (INDEPENDENT_AMBULATORY_CARE_PROVIDER_SITE_OTHER): Payer: Medicaid Other

## 2012-01-21 ENCOUNTER — Inpatient Hospital Stay (HOSPITAL_COMMUNITY)
Admission: AD | Admit: 2012-01-21 | Discharge: 2012-01-22 | DRG: 778 | Disposition: A | Discharge status: Home / Self Care | Payer: Medicaid Other | Source: Ambulatory Visit | Attending: Obstetrics and Gynecology | Admitting: Obstetrics and Gynecology

## 2012-01-21 ENCOUNTER — Encounter (HOSPITAL_COMMUNITY): Payer: Self-pay | Admitting: *Deleted

## 2012-01-21 VITALS — BP 120/66 | Temp 98.4°F | Wt 168.0 lb

## 2012-01-21 DIAGNOSIS — O9989 Other specified diseases and conditions complicating pregnancy, childbirth and the puerperium: Secondary | ICD-10-CM

## 2012-01-21 DIAGNOSIS — O99891 Other specified diseases and conditions complicating pregnancy: Secondary | ICD-10-CM | POA: Diagnosis present

## 2012-01-21 DIAGNOSIS — F410 Panic disorder [episodic paroxysmal anxiety] without agoraphobia: Secondary | ICD-10-CM | POA: Insufficient documentation

## 2012-01-21 DIAGNOSIS — O4700 False labor before 37 completed weeks of gestation, unspecified trimester: Secondary | ICD-10-CM

## 2012-01-21 DIAGNOSIS — IMO0001 Reserved for inherently not codable concepts without codable children: Secondary | ICD-10-CM | POA: Diagnosis present

## 2012-01-21 DIAGNOSIS — N3010 Interstitial cystitis (chronic) without hematuria: Secondary | ICD-10-CM | POA: Insufficient documentation

## 2012-01-21 DIAGNOSIS — F32A Depression, unspecified: Secondary | ICD-10-CM | POA: Insufficient documentation

## 2012-01-21 DIAGNOSIS — M797 Fibromyalgia: Secondary | ICD-10-CM | POA: Insufficient documentation

## 2012-01-21 DIAGNOSIS — K589 Irritable bowel syndrome without diarrhea: Secondary | ICD-10-CM | POA: Insufficient documentation

## 2012-01-21 DIAGNOSIS — K429 Umbilical hernia without obstruction or gangrene: Secondary | ICD-10-CM | POA: Diagnosis present

## 2012-01-21 DIAGNOSIS — R300 Dysuria: Secondary | ICD-10-CM

## 2012-01-21 DIAGNOSIS — F329 Major depressive disorder, single episode, unspecified: Secondary | ICD-10-CM | POA: Insufficient documentation

## 2012-01-21 DIAGNOSIS — M259 Joint disorder, unspecified: Secondary | ICD-10-CM

## 2012-01-21 DIAGNOSIS — D219 Benign neoplasm of connective and other soft tissue, unspecified: Secondary | ICD-10-CM | POA: Insufficient documentation

## 2012-01-21 DIAGNOSIS — G43909 Migraine, unspecified, not intractable, without status migrainosus: Secondary | ICD-10-CM | POA: Insufficient documentation

## 2012-01-21 DIAGNOSIS — O479 False labor, unspecified: Secondary | ICD-10-CM

## 2012-01-21 DIAGNOSIS — M899 Disorder of bone, unspecified: Secondary | ICD-10-CM

## 2012-01-21 LAB — COMPREHENSIVE METABOLIC PANEL
ALT: 17 U/L (ref 0–35)
AST: 16 U/L (ref 0–37)
CO2: 25 mEq/L (ref 19–32)
Calcium: 9.1 mg/dL (ref 8.4–10.5)
Chloride: 97 mEq/L (ref 96–112)
Creatinine, Ser: 0.48 mg/dL — ABNORMAL LOW (ref 0.50–1.10)
GFR calc Af Amer: >90 mL/min (ref >90)
GFR calc non Af Amer: >90 mL/min (ref >90)
Glucose, Bld: 84 mg/dL (ref 70–99)
Sodium: 132 mEq/L — ABNORMAL LOW (ref 135–145)
Total Bilirubin: 0.2 mg/dL — ABNORMAL LOW (ref 0.3–1.2)

## 2012-01-21 LAB — URINALYSIS, ROUTINE W REFLEX MICROSCOPIC
Leukocytes, UA: NEGATIVE
Nitrite: NEGATIVE
Protein, ur: NEGATIVE mg/dL
Specific Gravity, Urine: 1.01 (ref 1.005–1.030)
Urobilinogen, UA: 0.2 mg/dL (ref 0.0–1.0)

## 2012-01-21 LAB — CBC
HCT: 36.1 % (ref 36.0–46.0)
MCV: 91.6 fL (ref 78.0–100.0)
RBC: 3.94 MIL/uL (ref 3.87–5.11)
RDW: 13.9 % (ref 11.5–15.5)
WBC: 8.8 10*3/uL (ref 4.0–10.5)

## 2012-01-21 LAB — DIFFERENTIAL
Basophils Absolute: 0 10*3/uL (ref 0.0–0.1)
Eosinophils Relative: 1 % (ref 0–5)
Lymphocytes Relative: 18 % (ref 12–46)
Lymphs Abs: 1.6 10*3/uL (ref 0.7–4.0)
Monocytes Absolute: 0.8 10*3/uL (ref 0.1–1.0)
Neutro Abs: 6.3 10*3/uL (ref 1.7–7.7)

## 2012-01-21 LAB — POCT URINALYSIS DIPSTICK
Bilirubin, UA: NEGATIVE
Glucose, UA: NEGATIVE
Ketones, UA: NEGATIVE
Leukocytes, UA: NEGATIVE
Protein, UA: NEGATIVE
Spec Grav, UA: 1.015

## 2012-01-21 LAB — WET PREP, GENITAL
Trich, Wet Prep: NONE SEEN
Yeast Wet Prep HPF POC: NONE SEEN

## 2012-01-21 LAB — URINE MICROSCOPIC-ADD ON

## 2012-01-21 MED ORDER — NIFEDIPINE 10 MG PO CAPS
10.0000 mg | ORAL_CAPSULE | ORAL | Status: DC
  Administered 2012-01-21 – 2012-01-22 (×6): 10 mg via ORAL
  Filled 2012-01-21 (×5): qty 1

## 2012-01-21 MED ORDER — NIFEDIPINE 10 MG PO CAPS
10.0000 mg | ORAL_CAPSULE | Freq: Four times a day (QID) | ORAL | Status: DC
  Filled 2012-01-21: qty 1

## 2012-01-21 MED ORDER — LACTATED RINGERS IV BOLUS (SEPSIS): 500.0000 mL | Freq: Once | INTRAVENOUS | Status: DC

## 2012-01-21 MED ORDER — CALCIUM CARBONATE ANTACID 500 MG PO CHEW
2.0000 | CHEWABLE_TABLET | ORAL | Status: DC | PRN
  Administered 2012-01-22 (×2): 200 mg via ORAL
  Filled 2012-01-21: qty 2

## 2012-01-21 MED ORDER — PRENATAL MULTIVITAMIN CH
1.0000 | ORAL_TABLET | Freq: Every day | ORAL | Status: DC
  Filled 2012-01-21: qty 1

## 2012-01-21 MED ORDER — VICKS VAPORUB 4.73-1.2-2.6 % EX OINT: TOPICAL_OINTMENT | Freq: Four times a day (QID) | CUTANEOUS | Status: DC | PRN

## 2012-01-21 MED ORDER — LACTATED RINGERS IV SOLN
INTRAVENOUS | Status: DC
  Administered 2012-01-21 – 2012-01-22 (×2): via INTRAVENOUS
  Administered 2012-01-22 (×2): 125 mL/h via INTRAVENOUS

## 2012-01-21 MED ORDER — NIFEDIPINE 10 MG PO CAPS
10.0000 mg | ORAL_CAPSULE | Freq: Once | ORAL | Status: AC
  Administered 2012-01-21: 10 mg via ORAL
  Filled 2012-01-21: qty 1

## 2012-01-21 MED ORDER — CYCLOBENZAPRINE HCL 10 MG PO TABS
10.0000 mg | ORAL_TABLET | Freq: Three times a day (TID) | ORAL | Status: DC | PRN
  Administered 2012-01-22 (×2): 10 mg via ORAL
  Filled 2012-01-21 (×2): qty 1

## 2012-01-21 MED ORDER — NIFEDIPINE 10 MG PO CAPS: 10.0000 mg | ORAL_CAPSULE | Freq: Four times a day (QID) | ORAL | Status: DC

## 2012-01-21 MED ORDER — ACETAMINOPHEN 500 MG PO TABS
1000.0000 mg | ORAL_TABLET | Freq: Once | ORAL | Status: AC
  Administered 2012-01-21: 1000 mg via ORAL
  Filled 2012-01-21: qty 2

## 2012-01-21 MED ORDER — HYDROCODONE-ACETAMINOPHEN 5-500 MG PO TABS: 1.0000 | ORAL_TABLET | Freq: Four times a day (QID) | ORAL | Status: AC | PRN

## 2012-01-21 MED ORDER — DOCUSATE SODIUM 100 MG PO CAPS
100.0000 mg | ORAL_CAPSULE | Freq: Every day | ORAL | Status: DC
  Administered 2012-01-22: 100 mg via ORAL
  Filled 2012-01-21: qty 1

## 2012-01-21 MED ORDER — ZOLPIDEM TARTRATE 10 MG PO TABS
10.0000 mg | ORAL_TABLET | Freq: Every evening | ORAL | Status: DC | PRN
  Administered 2012-01-22: 10 mg via ORAL
  Filled 2012-01-21: qty 1

## 2012-01-21 NOTE — Discharge Instructions (Signed)
Preterm Labor Preterm labor is when labor starts at less than 37 weeks of pregnancy. The normal length of a pregnancy is 39 to 41 weeks. CAUSES Often, there is no identifiable underlying cause as to why a woman goes into preterm labor. However, one of the most common known causes of preterm labor is infection. Infections of the uterus, cervix, vagina, amniotic sac, bladder, kidney, or even the lungs (pneumonia) can cause labor to start. Other causes of preterm labor include:  Urogenital infections, such as yeast infections and bacterial vaginosis.   Uterine abnormalities (uterine shape, uterine septum, fibroids, bleeding from the placenta).   A cervix that has been operated on and opens prematurely.   Malformations in the baby.   Multiple gestations (twins, triplets, and so on).   Breakage of the amniotic sac.  Additional risk factors for preterm labor include:  Previous history of preterm labor.   Premature rupture of membranes (PROM).   A placenta that covers the opening of the cervix (placenta previa).   A placenta that separates from the uterus (placenta abruption).   A cervix that is too weak to hold the baby in the uterus (incompetence cervix).   Having too much fluid in the amniotic sac (polyhydramnios).   Taking illegal drugs or smoking while pregnant.   Not gaining enough weight while pregnant.   Women younger than 18 and older than 31 years old.   Low socioeconomic status.   African-American ethnicity.  SYMPTOMS Signs and symptoms of preterm labor include:  Menstrual-like cramps.   Contractions that are 30 to 70 seconds apart, become very regular, closer together, and are more intense and painful.   Contractions that start on the top of the uterus and spread down to the lower abdomen and back.   A sense of increased pelvic pressure or back pain.   A watery or bloody discharge that comes from the vagina.  DIAGNOSIS  A diagnosis can be confirmed by:  A  vaginal exam.   An ultrasound of the cervix.   Sampling (swabbing) cervico-vaginal secretions. These samples can be tested for the presence of fetal fibronectin. This is a protein found in cervical discharge which is associated with preterm labor.   Fetal monitoring.  TREATMENT  Depending on the length of the pregnancy and other circumstances, a caregiver may suggest bed rest. If necessary, there are medicines that can be given to stop contractions and to quicken fetal lung maturity. If labor happens before 34 weeks of pregnancy, a prolonged hospital stay may be recommended. Treatment depends on the condition of both the mother and baby. PREVENTION There are some things a mother can do to lower the risk of preterm labor in future pregnancies. A woman can:   Stop smoking.   Maintain healthy weight gain and avoid chemicals and drugs that are not necessary.   Be watchful for any type of infection.   Inform her caregiver if she has a known history of preterm labor.  Document Released: 11/10/2003 Document Revised: 08/09/2011 Document Reviewed: 12/15/2010 ExitCare Patient Information 2012 [employer].Fetal Movement Counts Patient Name: __________________________________________________ Patient Due Date: ____________________ Kick counts is highly recommended in high risk pregnancies, but it is a good idea for every pregnant woman to do. Start counting fetal movements at 28 weeks of the pregnancy. Fetal movements increase after eating a full meal or eating or drinking something sweet (the blood sugar is higher). It is also important to drink plenty of fluids (well hydrated) before doing the count. Lie   on your left side because it helps with the circulation or you can sit in a comfortable chair with your arms over your belly (abdomen) with no distractions around you. DOING THE COUNT  Try to do the count the same time of day each time you do it.   Mark the day and time, then see how long it  takes for you to feel 10 movements (kicks, flutters, swishes, rolls). You should have at least 10 movements within 2 hours. You will most likely feel 10 movements in much less than 2 hours. If you do not, wait an hour and count again. After a couple of days you will see a pattern.   What you are looking for is a change in the pattern or not enough counts in 2 hours. Is it taking longer in time to reach 10 movements?  SEEK MEDICAL CARE IF:  You feel less than 10 counts in 2 hours. Tried twice.   No movement in one hour.   The pattern is changing or taking longer each day to reach 10 counts in 2 hours.   You feel the baby is not moving as it usually does.  Date: ____________ Movements: ____________ Start time: ____________ Finish time: ____________  Date: ____________ Movements: ____________ Start time: ____________ Finish time: ____________ Date: ____________ Movements: ____________ Start time: ____________ Finish time: ____________ Date: ____________ Movements: ____________ Start time: ____________ Finish time: ____________ Date: ____________ Movements: ____________ Start time: ____________ Finish time: ____________ Date: ____________ Movements: ____________ Start time: ____________ Finish time: ____________ Date: ____________ Movements: ____________ Start time: ____________ Finish time: ____________ Date: ____________ Movements: ____________ Start time: ____________ Finish time: ____________  Date: ____________ Movements: ____________ Start time: ____________ Finish time: ____________ Date: ____________ Movements: ____________ Start time: ____________ Finish time: ____________ Date: ____________ Movements: ____________ Start time: ____________ Finish time: ____________ Date: ____________ Movements: ____________ Start time: ____________ Finish time: ____________ Date: ____________ Movements: ____________ Start time: ____________ Finish time: ____________ Date: ____________ Movements:  ____________ Start time: ____________ Finish time: ____________ Date: ____________ Movements: ____________ Start time: ____________ Finish time: ____________  Date: ____________ Movements: ____________ Start time: ____________ Finish time: ____________ Date: ____________ Movements: ____________ Start time: ____________ Finish time: ____________ Date: ____________ Movements: ____________ Start time: ____________ Finish time: ____________ Date: ____________ Movements: ____________ Start time: ____________ Finish time: ____________ Date: ____________ Movements: ____________ Start time: ____________ Finish time: ____________ Date: ____________ Movements: ____________ Start time: ____________ Finish time: ____________ Date: ____________ Movements: ____________ Start time: ____________ Finish time: ____________  Date: ____________ Movements: ____________ Start time: ____________ Finish time: ____________ Date: ____________ Movements: ____________ Start time: ____________ Finish time: ____________ Date: ____________ Movements: ____________ Start time: ____________ Finish time: ____________ Date: ____________ Movements: ____________ Start time: ____________ Finish time: ____________ Date: ____________ Movements: ____________ Start time: ____________ Finish time: ____________ Date: ____________ Movements: ____________ Start time: ____________ Finish time: ____________ Date: ____________ Movements: ____________ Start time: ____________ Finish time: ____________  Date: ____________ Movements: ____________ Start time: ____________ Finish time: ____________ Date: ____________ Movements: ____________ Start time: ____________ Finish time: ____________ Date: ____________ Movements: ____________ Start time: ____________ Finish time: ____________ Date: ____________ Movements: ____________ Start time: ____________ Finish time: ____________ Date: ____________ Movements: ____________ Start time: ____________ Finish  time: ____________ Date: ____________ Movements: ____________ Start time: ____________ Finish time: ____________ Date: ____________ Movements: ____________ Start time: ____________ Finish time: ____________  Date: ____________ Movements: ____________ Start time: ____________ Finish time: ____________ Date: ____________ Movements: ____________ Start time: ____________ Finish time: ____________ Date: ____________ Movements: ____________ Start time: ____________ Finish time: ____________   Date: ____________ Movements: ____________ Start time: ____________ Finish time: ____________ Date: ____________ Movements: ____________ Start time: ____________ Finish time: ____________ Date: ____________ Movements: ____________ Start time: ____________ Finish time: ____________ Date: ____________ Movements: ____________ Start time: ____________ Finish time: ____________  Date: ____________ Movements: ____________ Start time: ____________ Finish time: ____________ Date: ____________ Movements: ____________ Start time: ____________ Finish time: ____________ Date: ____________ Movements: ____________ Start time: ____________ Finish time: ____________ Date: ____________ Movements: ____________ Start time: ____________ Finish time: ____________ Date: ____________ Movements: ____________ Start time: ____________ Finish time: ____________ Date: ____________ Movements: ____________ Start time: ____________ Finish time: ____________ Date: ____________ Movements: ____________ Start time: ____________ Finish time: ____________  Date: ____________ Movements: ____________ Start time: ____________ Finish time: ____________ Date: ____________ Movements: ____________ Start time: ____________ Finish time: ____________ Date: ____________ Movements: ____________ Start time: ____________ Finish time: ____________ Date: ____________ Movements: ____________ Start time: ____________ Finish time: ____________ Date: ____________  Movements: ____________ Start time: ____________ Finish time: ____________ Date: ____________ Movements: ____________ Start time: ____________ Finish time: ____________ Document Released: 09/19/2006 Document Revised: 08/09/2011 Document Reviewed: 03/22/2009 Exit[employer] Patient Information 2012 [employer]. 

## 2012-01-21 NOTE — Progress Notes (Signed)
C/o pelvic pain x 1 wk, recent hospital visit for same sx's, finishied antiobiotics on Sat Pt states hernia pain when baby moves

## 2012-01-21 NOTE — Progress Notes (Signed)
Work-in for dysuria, "difficulty emptying completely," increased abdominal pain, especially around umbilical hernia.  Palpated a ctx just after pt laid down for FH and doppler.  Lasted <30 sec.  Cx closed/30%/high.  Rev'd PTL s/s.  Pt seen in MAU 5/10 and trx'd for BV & suspected UTI (reports finished macrobid & flayl), but no urine cx sent.  Sent today.  FFN neg on 5/10, so disc'd w/ pt may repeat at her next visit if > or = 14 days.  Rev'd normal gtt.  Pt is unemployed.  Less than 1 hr after pt left appt, returned to office reporting ctxs every " ," so sent to MAU for further eval.  Sanda Klein, CNM notified.

## 2012-01-21 NOTE — MAU Provider Note (Signed)
History     CSN: [id number]  Arrival date and time: 01/21/12 1646   First Provider Initiated Contact with Patient 01/21/12 1746      No chief complaint on file.  HPI Comments: Pt is G1P0 at [redacted]w[redacted]d that arrived after being seen at office for workin visit secondary to "dysuria" and now has c/o ctx every "few min" states she felt like she had ctx all day, but now have increased since being seen at office. Per [facility] VE was c/30/high.    Pregnancy significant for:  1. Fibromyalgia - dx'd 2009  2. Migraines with visual aura/spots  3. Fibroids in past  4. Interstitial cystitis  5, depression/panic/anxiety in past, has taken cymbalta none currently  6. Hx endometriosis  7. Hx trich  8. Hx anemia  9. Scoliosis   Past Medical History  Diagnosis Date  . Neuromuscular disorder     fibromyalagia  . Back pain   . Gastritis   . Interstitial cystitis   . Endometrial cyst of ovary   . Anxiety     pt states she experiences panic attacks  . GERD (gastroesophageal reflux disease)   . Migraine   . Hernia, hiatal   . Fibroid   . Panic attack   . Depression   . Panic attacks 01/09/2012  . Fibromyalgia     Past Surgical History  Procedure Date  . Laparoscopy 06/18/2011    Procedure: LAPAROSCOPY DIAGNOSTIC;  Surgeon: Hollie Salk C. Marice Potter, MD;  Location: [facility];  Service: Gynecology;  Laterality: Bilateral;  with bilateral chromopertubation with excision of endometrial biopsy  . Laparoscopic endometriosis fulguration 06/2011    Family History  Problem Relation Age of Onset  . Fibroids Mother   . Hypertension Mother   . Hypertension Father   . Diabetes Father   . Kidney disease Father     kidney stones  . Gout Father     History  Substance Use Topics  . Smoking status: Never Smoker   . Smokeless tobacco: Never Used  . Alcohol Use: No    Allergies:  Allergies  Allergen Reactions  . Percocet (Oxycodone-Acetaminophen) Itching    Prescriptions prior to admission  Medication Sig  Dispense Refill  . calcium carbonate (TUMS - DOSED IN MG ELEMENTAL CALCIUM) 500 MG chewable tablet Chew 1 tablet by mouth daily as needed. For heartburn      . Camphor-Eucalyptus-Menthol (VICKS VAPORUB EX) Apply 1 application topically daily as needed. For stuffy nose       . diphenhydramine-acetaminophen (TYLENOL PM) 25-500 MG TABS Take 1 tablet by mouth at bedtime as needed. For migraines      . ferrous sulfate 325 (65 FE) MG tablet Take 325 mg by mouth daily with breakfast.      . Prenatal Vit-Fe Fumarate-FA (PRENATAL MULTIVITAMIN) TABS Take 1 tablet by mouth at bedtime.       . ranitidine (ZANTAC) 150 MG tablet Take 150 mg by mouth daily as needed. For heartburn        Review of Systems  Gastrointestinal: Positive for abdominal pain.  Genitourinary: Positive for dysuria.       C/o not being able to empty bladder ?cpl weeks  Musculoskeletal: Positive for back pain.  Neurological: Positive for headaches.  Psychiatric/Behavioral: Positive for depression. Negative for suicidal ideas.  All other systems reviewed and are negative.   Physical Exam   Blood pressure 111/68, pulse 96, temperature 98.6 F (37 C), temperature source Oral, resp. rate 20, height 5' 6.5" (1.689 m), weight  168 lb (76.204 kg), last menstrual period 06/23/2011.  Physical Exam  Nursing note and vitals reviewed. Constitutional: She is oriented to person, place, and time. She appears well-developed and well-nourished. She appears distressed.       Pt groans and grimaces noted w aud FM, and has hands on abd,   HENT:  Head: Normocephalic.  Neck: Normal range of motion.  Cardiovascular: Normal rate, regular rhythm and normal heart sounds.   Respiratory: Effort normal and breath sounds normal.  GI: Soft. There is tenderness. There is no rebound and no guarding.  Genitourinary: Vagina normal.  Musculoskeletal: Normal range of motion. She exhibits no edema.  Neurological: She is alert and oriented to person, place, and  time.  Skin: Skin is warm and dry.  Psychiatric: She has a normal mood and affect. Her behavior is normal.   Results for orders placed during the hospital encounter of 01/21/12 (from the past 24 hour(s))  URINALYSIS, ROUTINE W REFLEX MICROSCOPIC     Status: Abnormal   Collection Time   01/21/12  5:00 PM      Component Value Range   Color, Urine YELLOW  YELLOW    APPearance CLEAR  CLEAR    Specific Gravity, Urine 1.010  1.005 - 1.030    pH 6.5  5.0 - 8.0    Glucose, UA NEGATIVE  NEGATIVE (mg/dL)   Hgb urine dipstick TRACE (*) NEGATIVE    Bilirubin Urine NEGATIVE  NEGATIVE    Ketones, ur NEGATIVE  NEGATIVE (mg/dL)   Protein, ur NEGATIVE  NEGATIVE (mg/dL)   Urobilinogen, UA 0.2  0.0 - 1.0 (mg/dL)   Nitrite NEGATIVE  NEGATIVE    Leukocytes, UA NEGATIVE  NEGATIVE   URINE MICROSCOPIC-ADD ON     Status: Abnormal   Collection Time   01/21/12  5:00 PM      Component Value Range   Squamous Epithelial / LPF FEW (*) RARE    WBC, UA 0-2  <3 (WBC/hpf)   RBC / HPF 0-2  <3 (RBC/hpf)   Bacteria, UA FEW (*) RARE   WET PREP, GENITAL     Status: Abnormal   Collection Time   01/21/12  5:50 PM      Component Value Range   Yeast Wet Prep HPF POC NONE SEEN  NONE SEEN    Trich, Wet Prep NONE SEEN  NONE SEEN    Clue Cells Wet Prep HPF POC NONE SEEN  NONE SEEN    WBC, Wet Prep HPF POC FEW (*) NONE SEEN      MAU Course  Procedures    Assessment and Plan  IUP at [redacted]w[redacted]d No cervical change  [place] - pending D/W Dr Imagene Gurney 01/21/2012, 8:48 PM

## 2012-01-21 NOTE — Telephone Encounter (Signed)
TC to pt.  Sched for eval w/[facility] today.

## 2012-01-21 NOTE — Telephone Encounter (Signed)
Tc TO PT.  States is having pain in pelvic area and down left leg.  Also having difficulty with urination sometimes with increased pelvic pain.  Was seen in MAU fir BV and UTI.  Has not taken any Tylenol.  Warm shower did not help 01/20/12.  +FM   No contractions.   Will consult with provider.

## 2012-01-21 NOTE — MAU Note (Signed)
PT SAYS SHE CAME FROM OFFICE. - PT WAS  HAVING PAIN AND PRESSURE- COLLECTED URINE- SAYS NL.   THEY DID VE- CLOSED-  SAW [facility] - SO SENT HER HERE..   PT SAYS SHE WAS HERE ON 5-10-  FOR PAIN-  DID FFN-NEG- PLAN TO REPEAT.  ALSO TOLD PT SHE HAD BACTERIAL INFECTION-  SHE TOOK ALL MEDS.

## 2012-01-21 NOTE — Telephone Encounter (Signed)
Nurse/ob 

## 2012-01-22 ENCOUNTER — Inpatient Hospital Stay (HOSPITAL_COMMUNITY): Payer: Medicaid Other

## 2012-01-22 ENCOUNTER — Encounter (HOSPITAL_COMMUNITY): Payer: Self-pay | Admitting: *Deleted

## 2012-01-22 DIAGNOSIS — K429 Umbilical hernia without obstruction or gangrene: Secondary | ICD-10-CM

## 2012-01-22 DIAGNOSIS — O4700 False labor before 37 completed weeks of gestation, unspecified trimester: Secondary | ICD-10-CM

## 2012-01-22 LAB — URINE CULTURE
Colony Count: NO GROWTH
Organism ID, Bacteria: NO GROWTH

## 2012-01-22 MED ORDER — ACETAMINOPHEN 500 MG PO TABS: 1000.0000 mg | ORAL_TABLET | ORAL | Status: DC | PRN

## 2012-01-22 MED ORDER — NIFEDIPINE 10 MG PO CAPS: 10.0000 mg | ORAL_CAPSULE | ORAL | Status: DC

## 2012-01-22 MED ORDER — HYDROCODONE-ACETAMINOPHEN 5-325 MG PO TABS: 1.0000 | ORAL_TABLET | ORAL | Status: DC | PRN

## 2012-01-22 MED ORDER — BUTORPHANOL TARTRATE 2 MG/ML IJ SOLN
1.0000 mg | INTRAMUSCULAR | Status: DC | PRN
  Administered 2012-01-22: 1 mg via INTRAVENOUS
  Filled 2012-01-22: qty 1

## 2012-01-22 MED ORDER — CYCLOBENZAPRINE HCL 10 MG PO TABS: 10.0000 mg | ORAL_TABLET | Freq: Two times a day (BID) | ORAL | Status: AC | PRN

## 2012-01-22 NOTE — Discharge Summary (Signed)
Obstetric Discharge Summary Reason for Admission: preterm contractions Prenatal Procedures: NST and ultrasound Intrapartum Procedures: none Postpartum Procedures: none Complications-Operative and Postpartum: none Hemoglobin  Date Value Range Status  01/21/2012 11.7* 12.0-15.0 (g/dL) Final  12/08/8293 62.1   Final     HCT  Date Value Range Status  01/21/2012 36.1  36.0-46.0 (%) Final  09/06/2011 36   Final    Physical Exam:  General: alert and cooperative Lochia: none Uterine Fundus: pt gravid, abdomen is soft and non tender Incision: N/A DVT Evaluation: No cords or calf tenderness. The patient came in with preterm contractions.  She had no relief with IVF or procardia.  She did not have any cervical change.  She is discharged home without cervical change.  Her contractions did subside with IVF, stadol and rest.  She had an [place] today showed normal fluid with BPP 8/8 and normal placentation. No signs of abruption.  Pt will be sent home with PTL precautions Discharge Diagnoses: preterm contractions without cervical change  Discharge Information: Date: 01/22/2012 Activity: unrestricted Diet: routine Medications: flexeril and procardia Condition: stable Instructions: refer to practice specific booklet and see instrucitons Discharge to: home Follow-up Information    Follow up with [facility]. Schedule an appointment as soon as possible for a visit in 2 days. (Call office to reschedule appointment for this Friday)          Newborn Data: This patient has no babies on file. Home with mother.  the patient is still pregnant.  Meredith Howard A 01/22/2012, 6:34 PM

## 2012-01-22 NOTE — Progress Notes (Signed)
01/22/12 1400  Clinical Encounter Type  Visited With Patient and family together  Visit Type Initial    Made initial visit to offer spiritual and emotional support.  Pt was resting and described herself as "delirious from medication," so we agreed to follow up another time.  Let pt and family know of ongoing chaplain availability.  [address] [place], [place] [phone]

## 2012-01-22 NOTE — H&P (Addendum)
Meredith Howard is a 31 y.o. female presenting for ctx, see prev note.  Pt just completed flagyl and macrobid tx, 3 days ago, after being seen in [facility] for c/o abd pain/ctx.   Pt has appointment w "behavioral health" next week, pt unsure of provider name states she has card at home, secondary to depression/anxiety, pt denies any SI/I and states her husband is supportive but family lives out of state.    Pregnancy significant for: 1. Fibromyalgia - dx'd 2009  2. Migraines with visual aura/spots  3. Fibroids in past 4. Interstitial cystitis  5, depression/panic/anxiety in past, has taken cymbalta none currently  6. Hx endometriosis  7. Hx trich  8. Hx anemia  9. Scoliosis  10. Umbilical hernia     Maternal Medical History:  Reason for admission: Reason for admission: contractions.  Contractions: Onset was 2 days ago.   Frequency: regular.   Perceived severity is moderate.    Fetal activity: Perceived fetal activity is normal.   Last perceived fetal movement was within the past hour.    Prenatal complications: Preterm labor.     OB History    Grav Para Term Preterm Abortions TAB SAB Ect Mult Living   1 0 0 0 0 0 0 0 0 0      Past Medical History  Diagnosis Date  . Neuromuscular disorder     fibromyalagia  . Back pain   . Gastritis   . Interstitial cystitis   . Endometrial cyst of ovary   . Anxiety     pt states she experiences panic attacks  . GERD (gastroesophageal reflux disease)   . Migraine   . Hernia, hiatal   . Fibroid   . Panic attack   . Depression   . Panic attacks 01/09/2012  . Fibromyalgia    Past Surgical History  Procedure Date  . Laparoscopy 06/18/2011    Procedure: LAPAROSCOPY DIAGNOSTIC;  Surgeon: Hollie Salk C. Marice Potter, MD;  Location: [facility];  Service: Gynecology;  Laterality: Bilateral;  with bilateral chromopertubation with excision of endometrial biopsy  . Laparoscopic endometriosis fulguration 06/2011   Family History: family history includes Diabetes  in her father; Fibroids in her mother; Gout in her father; Hypertension in her father and mother; and Kidney disease in her father. Social History:  reports that she has never smoked. She has never used smokeless tobacco. She reports that she does not drink alcohol or use illicit drugs.  Review of Systems  Constitutional: Positive for malaise/fatigue.  Gastrointestinal: Positive for abdominal pain.  Genitourinary: Positive for dysuria. Negative for flank pain.  Musculoskeletal: Positive for myalgias and back pain.  Neurological: Positive for headaches.  Psychiatric/Behavioral: Positive for depression. Negative for suicidal ideas.  All other systems reviewed and are negative.    Dilation: Closed Effacement (%): Thick Station:  (HIGH) Exam by:: S. LILLIARD, CNM Blood pressure 120/67, pulse 94, temperature 98.4 F (36.9 C), temperature source Oral, resp. rate 20, height 5' 6.5" (1.689 m), weight 168 lb (76.204 kg), last menstrual period 06/23/2011. Maternal Exam:  Uterine Assessment: Contraction strength is moderate.  Contraction frequency is regular.   Abdomen: Fundal height is aga.   Fetal presentation: vertex  Introitus: Normal vulva. Vagina is positive for vaginal discharge.  Ferning test: not done.  Amniotic fluid character: not assessed.  Pelvis: adequate for delivery.   Cervix: Cervix evaluated by sterile speculum exam and digital exam.     Fetal Exam Fetal Monitor Review: Mode: ultrasound.   Baseline rate: 140.  Variability:  moderate (6-25 bpm).   Pattern: no decelerations.   10x10 accels noted overtime   Fetal State Assessment: Category I - tracings are normal.     Physical Exam  Nursing note and vitals reviewed. Constitutional: She is oriented to person, place, and time. She appears well-developed and well-nourished. She appears distressed.  HENT:  Head: Normocephalic.  Eyes: Pupils are equal, round, and reactive to light.  Cardiovascular: Normal rate,  regular rhythm and normal heart sounds.        Slightly tachcardic  Respiratory: Effort normal and breath sounds normal.  GI: Soft. Bowel sounds are normal. She exhibits no distension. There is tenderness. There is guarding. There is no rebound.       Slightly guarded w deep palpation Initially was mainly r sided upper abd appeared to be associated w FM Neg CVAT  Genitourinary: Uterus normal. Vaginal discharge found.  Musculoskeletal: Normal range of motion. She exhibits no edema.  Neurological: She is alert and oriented to person, place, and time. She has normal reflexes.  Skin: Skin is warm and dry.  Psychiatric: She has a normal mood and affect. Her behavior is normal.    Addendum: no evidence hernia on abd exam Results for orders placed during the hospital encounter of 01/21/12 (from the past 24 hour(s))  URINALYSIS, ROUTINE W REFLEX MICROSCOPIC     Status: Abnormal   Collection Time   01/21/12  5:00 PM      Component Value Range   Color, Urine YELLOW  YELLOW    APPearance CLEAR  CLEAR    Specific Gravity, Urine 1.010  1.005 - 1.030    pH 6.5  5.0 - 8.0    Glucose, UA NEGATIVE  NEGATIVE (mg/dL)   Hgb urine dipstick TRACE (*) NEGATIVE    Bilirubin Urine NEGATIVE  NEGATIVE    Ketones, ur NEGATIVE  NEGATIVE (mg/dL)   Protein, ur NEGATIVE  NEGATIVE (mg/dL)   Urobilinogen, UA 0.2  0.0 - 1.0 (mg/dL)   Nitrite NEGATIVE  NEGATIVE    Leukocytes, UA NEGATIVE  NEGATIVE   URINE MICROSCOPIC-ADD ON     Status: Abnormal   Collection Time   01/21/12  5:00 PM      Component Value Range   Squamous Epithelial / LPF FEW (*) RARE    WBC, UA 0-2  <3 (WBC/hpf)   RBC / HPF 0-2  <3 (RBC/hpf)   Bacteria, UA FEW (*) RARE   WET PREP, GENITAL     Status: Abnormal   Collection Time   01/21/12  5:50 PM      Component Value Range   Yeast Wet Prep HPF POC NONE SEEN  NONE SEEN    Trich, Wet Prep NONE SEEN  NONE SEEN    Clue Cells Wet Prep HPF POC NONE SEEN  NONE SEEN    WBC, Wet Prep HPF POC FEW (*)  NONE SEEN   CBC     Status: Abnormal   Collection Time   01/21/12  9:55 PM      Component Value Range   WBC 8.8  4.0 - 10.5 (K/uL)   RBC 3.94  3.87 - 5.11 (MIL/uL)   Hemoglobin 11.7 (*) 12.0 - 15.0 (g/dL)   HCT 40.9  81.1 - 91.4 (%)   MCV 91.6  78.0 - 100.0 (fL)   MCH 29.7  26.0 - 34.0 (pg)   MCHC 32.4  30.0 - 36.0 (g/dL)   RDW 78.2  95.6 - 21.3 (%)   Platelets 236  150 -  400 (K/uL)  DIFFERENTIAL     Status: Normal   Collection Time   01/21/12  9:55 PM      Component Value Range   Neutrophils Relative 72  43 - 77 (%)   Neutro Abs 6.3  1.7 - 7.7 (K/uL)   Lymphocytes Relative 18  12 - 46 (%)   Lymphs Abs 1.6  0.7 - 4.0 (K/uL)   Monocytes Relative 9  3 - 12 (%)   Monocytes Absolute 0.8  0.1 - 1.0 (K/uL)   Eosinophils Relative 1  0 - 5 (%)   Eosinophils Absolute 0.1  0.0 - 0.7 (K/uL)   Basophils Relative 0  0 - 1 (%)   Basophils Absolute 0.0  0.0 - 0.1 (K/uL)  COMPREHENSIVE METABOLIC PANEL     Status: Abnormal   Collection Time   01/21/12  9:55 PM      Component Value Range   Sodium 132 (*) 135 - 145 (mEq/L)   Potassium 3.7  3.5 - 5.1 (mEq/L)   Chloride 97  96 - 112 (mEq/L)   CO2 25  19 - 32 (mEq/L)   Glucose, Bld 84  70 - 99 (mg/dL)   BUN 7  6 - 23 (mg/dL)   Creatinine, Ser 0.86 (*) 0.50 - 1.10 (mg/dL)   Calcium 9.1  8.4 - 57.8 (mg/dL)   Total Protein 6.2  6.0 - 8.3 (g/dL)   Albumin 2.9 (*) 3.5 - 5.2 (g/dL)   AST 16  0 - 37 (U/L)   ALT 17  0 - 35 (U/L)   Alkaline Phosphatase 101  39 - 117 (U/L)   Total Bilirubin 0.2 (*) 0.3 - 1.2 (mg/dL)   GFR calc non Af Amer >90  >90 (mL/min)   GFR calc Af Amer >90  >90 (mL/min)    Prenatal labs: ABO, Rh: O/Positive/-- (01/03 0000) Antibody: Negative (01/03 0000) Rubella: Immune (01/03 0000) RPR: NON REAC (05/09 0926)  HBsAg: Negative (01/03 0000)  HIV: Non-reactive (01/03 0000)  GBS:     Assessment/Plan: IUP at 30w3 No evidence acute infection, urine tr hemoglobin, will send for cx Persistent preterm ctx/abd pain, no  cervical change, despite havingg procardia, ivf's,  After reviewing r/b w pt, pt elects for observation overnight, declines pain meds - HA resolved w tylenol  Will continue with procardia q4h ATC,  Flexeril now and ambien for sleep  Pt to have FFN on 5/24  D/w dr Su Hilt    Malissa Hippo 01/22/2012, 6:13 AM

## 2012-01-22 NOTE — Progress Notes (Signed)
UR Chart review completed.  

## 2012-01-22 NOTE — Progress Notes (Signed)
Meredith Howard is a 31 y.o. G1P0000 at [redacted]w[redacted]d admitted for preterm contractions.  Subjective: Called to pt's bedside just before 1100 b/c pt c/o 5/10 pain w/ ctxs and feels are closer together.  She reports able to sleep soundly most of the night after Ambien & Flexeril.  GFM.  No LOF or VB.  Pt had u/s for BPP last night and 8/8 (for NR NST).  She also had growth u/s on 5/10 w/ BPP.  Pt's husband is at bedside.    Objective: BP 101/59  Pulse 106  Temp(Src) 98.4 F (36.9 C) (Oral)  Resp 20  Ht 5' 6.5" (1.689 m)  Wt 168 lb (76.204 kg)  BMI 26.71 kg/m2  LMP 06/23/2011      FHT:  FHR: 140 bpm, variability: minimal ,  accelerations:  Abscent,  decelerations:  Absent UC:   regular, every 1-2 minutes SVE:   Dilation: Closed Effacement (%): 30 Station:  (HIGH) Exam by:: H . Solace Wendorff, CNMPosterior.  cx is unchanged from my exam yesterday afternoon  Labs: Lab Results  Component Value Date   WBC 8.8 01/21/2012   HGB 11.7* 01/21/2012   HCT 36.1 01/21/2012   MCV 91.6 01/21/2012   PLT 236 01/21/2012    Assessment / Plan: 1.  30.3  2.  Preterm ctxs, despite Procardia q 4hrs, but no dilatation  3.  umbilical hernia  4.  h/o anxiety/depression  5.  fibromyalgia  6.  FFN neg 5/10  7.  Trx'd for BV & UTI 5/10  8.  Nonreactive NST  Plan:  Per c/w dr. Normand Sloop, will continue q 4hr procardia and she rec'd vicodin po prn pain, but pt reports has caused nausea in past, so will try Stadol IV prn.   Obtain u/s to eval placental to r/o abruption; will do AFI, BPP, and TV cervical length. CTO ctxs closely.    Burnis Kaser H 01/22/2012, 2:41 PM

## 2012-01-23 LAB — URINE CULTURE

## 2012-01-24 ENCOUNTER — Telehealth: Payer: Self-pay | Admitting: Obstetrics and Gynecology

## 2012-01-24 ENCOUNTER — Encounter: Payer: Medicaid Other | Admitting: Obstetrics and Gynecology

## 2012-01-24 NOTE — Telephone Encounter (Signed)
Spoke with pt need sppt 01/25/12 for f/u from [facility] and ffn pt has appt 01/25/12 AT 2:00 with SL pt voice understanding

## 2012-01-24 NOTE — Telephone Encounter (Signed)
TRIAGE/APPT TODAY

## 2012-01-24 NOTE — Telephone Encounter (Signed)
Message copied by Rolla Plate on Thu Jan 24, 2012 10:01 AM ------      Message from: Jaymes Graff      Created: Wed Jan 23, 2012  7:08 PM       Cancel appointment tomorrow.  She needs to come in Friday to see another provider in the office      ----- Message -----         From: Claudie Leach, CMA         Sent: 01/23/2012   8:20 AM           To: Michael Litter, MD            Hey Dr. D you wanted this pt to come in on Friday to do a ffn pt has appt with you on Thursday can see keep that appt or do you want her to come in on Friday

## 2012-01-25 ENCOUNTER — Ambulatory Visit (INDEPENDENT_AMBULATORY_CARE_PROVIDER_SITE_OTHER): Payer: Medicaid Other | Admitting: Obstetrics and Gynecology

## 2012-01-25 NOTE — Progress Notes (Signed)
[redacted]w[redacted]d FFN collected, VE cl/th/high CBE and BF classes, choose peds occ ctx, pelvic pain,  states feels much better rv'd PTL sx's [facility] RTO 2wks

## 2012-01-25 NOTE — Progress Notes (Signed)
C/o inflammation pelvic area; pt has gastritis

## 2012-01-25 NOTE — Patient Instructions (Signed)
Fetal Movement Counts Patient Name: __________________________________________________ Patient Due Date: ____________________ Kick counts is highly recommended in high risk pregnancies, but it is a good idea for every pregnant woman to do. Start counting fetal movements at 28 weeks of the pregnancy. Fetal movements increase after eating a full meal or eating or drinking something sweet (the blood sugar is higher). It is also important to drink plenty of fluids (well hydrated) before doing the count. Lie on your left side because it helps with the circulation or you can sit in a comfortable chair with your arms over your belly (abdomen) with no distractions around you. DOING THE COUNT  Try to do the count the same time of day each time you do it.   Mark the day and time, then see how long it takes for you to feel 10 movements (kicks, flutters, swishes, rolls). You should have at least 10 movements within 2 hours. You will most likely feel 10 movements in much less than 2 hours. If you do not, wait an hour and count again. After a couple of days you will see a pattern.   What you are looking for is a change in the pattern or not enough counts in 2 hours. Is it taking longer in time to reach 10 movements?  SEEK MEDICAL CARE IF:  You feel less than 10 counts in 2 hours. Tried twice.   No movement in one hour.   The pattern is changing or taking longer each day to reach 10 counts in 2 hours.   You feel the baby is not moving as it usually does.  Date: ____________ Movements: ____________ Start time: ____________ Finish time: ____________  Date: ____________ Movements: ____________ Start time: ____________ Finish time: ____________ Date: ____________ Movements: ____________ Start time: ____________ Finish time: ____________ Date: ____________ Movements: ____________ Start time: ____________ Finish time: ____________ Date: ____________ Movements: ____________ Start time: ____________ Finish time:  ____________ Date: ____________ Movements: ____________ Start time: ____________ Finish time: ____________ Date: ____________ Movements: ____________ Start time: ____________ Finish time: ____________ Date: ____________ Movements: ____________ Start time: ____________ Finish time: ____________  Date: ____________ Movements: ____________ Start time: ____________ Finish time: ____________ Date: ____________ Movements: ____________ Start time: ____________ Finish time: ____________ Date: ____________ Movements: ____________ Start time: ____________ Finish time: ____________ Date: ____________ Movements: ____________ Start time: ____________ Finish time: ____________ Date: ____________ Movements: ____________ Start time: ____________ Finish time: ____________ Date: ____________ Movements: ____________ Start time: ____________ Finish time: ____________ Date: ____________ Movements: ____________ Start time: ____________ Finish time: ____________  Date: ____________ Movements: ____________ Start time: ____________ Finish time: ____________ Date: ____________ Movements: ____________ Start time: ____________ Finish time: ____________ Date: ____________ Movements: ____________ Start time: ____________ Finish time: ____________ Date: ____________ Movements: ____________ Start time: ____________ Finish time: ____________ Date: ____________ Movements: ____________ Start time: ____________ Finish time: ____________ Date: ____________ Movements: ____________ Start time: ____________ Finish time: ____________ Date: ____________ Movements: ____________ Start time: ____________ Finish time: ____________  Date: ____________ Movements: ____________ Start time: ____________ Finish time: ____________ Date: ____________ Movements: ____________ Start time: ____________ Finish time: ____________ Date: ____________ Movements: ____________ Start time: ____________ Finish time: ____________ Date: ____________ Movements:  ____________ Start time: ____________ Finish time: ____________ Date: ____________ Movements: ____________ Start time: ____________ Finish time: ____________ Date: ____________ Movements: ____________ Start time: ____________ Finish time: ____________ Date: ____________ Movements: ____________ Start time: ____________ Finish time: ____________  Date: ____________ Movements: ____________ Start time: ____________ Finish time: ____________ Date: ____________ Movements: ____________ Start time: ____________ Finish time: ____________ Date: ____________ Movements: ____________ Start time:   ____________ Finish time: ____________ Date: ____________ Movements: ____________ Start time: ____________ Finish time: ____________ Date: ____________ Movements: ____________ Start time: ____________ Finish time: ____________ Date: ____________ Movements: ____________ Start time: ____________ Finish time: ____________ Date: ____________ Movements: ____________ Start time: ____________ Finish time: ____________  Date: ____________ Movements: ____________ Start time: ____________ Finish time: ____________ Date: ____________ Movements: ____________ Start time: ____________ Finish time: ____________ Date: ____________ Movements: ____________ Start time: ____________ Finish time: ____________ Date: ____________ Movements: ____________ Start time: ____________ Finish time: ____________ Date: ____________ Movements: ____________ Start time: ____________ Finish time: ____________ Date: ____________ Movements: ____________ Start time: ____________ Finish time: ____________ Date: ____________ Movements: ____________ Start time: ____________ Finish time: ____________  Date: ____________ Movements: ____________ Start time: ____________ Finish time: ____________ Date: ____________ Movements: ____________ Start time: ____________ Finish time: ____________ Date: ____________ Movements: ____________ Start time: ____________ Finish  time: ____________ Date: ____________ Movements: ____________ Start time: ____________ Finish time: ____________ Date: ____________ Movements: ____________ Start time: ____________ Finish time: ____________ Date: ____________ Movements: ____________ Start time: ____________ Finish time: ____________ Date: ____________ Movements: ____________ Start time: ____________ Finish time: ____________  Date: ____________ Movements: ____________ Start time: ____________ Finish time: ____________ Date: ____________ Movements: ____________ Start time: ____________ Finish time: ____________ Date: ____________ Movements: ____________ Start time: ____________ Finish time: ____________ Date: ____________ Movements: ____________ Start time: ____________ Finish time: ____________ Date: ____________ Movements: ____________ Start time: ____________ Finish time: ____________ Date: ____________ Movements: ____________ Start time: ____________ Finish time: ____________ Document Released: 09/19/2006 Document Revised: 08/09/2011 Document Reviewed: 03/22/2009 [employer] Patient Information 2012 [employer].  Preventing Preterm Labor Preterm labor is when a pregnant woman has contractions that cause the cervix to open, shorten, and thin before 37 weeks of pregnancy. You will have regular contractions (tightening) 2 to 3 minutes apart. This usually causes discomfort or pain. HOME CARE  Eat a healthy diet.   Take your vitamins as told by your doctor.   Drink enough fluids to keep your pee (urine) clear or pale yellow every day.   Get rest and sleep.   Do not have sex if you are at high risk for preterm labor.   Follow your doctor's advice about activity, medicines, and tests.   Avoid stress.   Avoid hard labor or exercise that lasts for a long time.   Do not smoke.  GET HELP RIGHT AWAY IF:   You are having contractions.   You have belly (abdominal) pain.   You have bleeding from your vagina.   You  have pain when you pee (urinate).   You have abnormal discharge from your vagina.   You have a temperature by mouth above 102 F (38.9 C).  MAKE SURE YOU:  Understand these instructions.   Will watch your condition.   Will get help if you are not doing well or get worse.  Document Released: 11/16/2008 Document Revised: 08/09/2011 Document Reviewed: 11/16/2008 [employer] Patient Information 2012 [employer]. 

## 2012-02-05 ENCOUNTER — Encounter (HOSPITAL_COMMUNITY): Payer: Self-pay | Admitting: *Deleted

## 2012-02-05 ENCOUNTER — Inpatient Hospital Stay (HOSPITAL_COMMUNITY)
Admission: AD | Admit: 2012-02-05 | Discharge: 2012-02-05 | Disposition: A | Discharge status: Home / Self Care | Payer: Medicaid Other | Source: Ambulatory Visit | Attending: Obstetrics and Gynecology | Admitting: Obstetrics and Gynecology

## 2012-02-05 ENCOUNTER — Inpatient Hospital Stay (HOSPITAL_COMMUNITY): Payer: Medicaid Other

## 2012-02-05 DIAGNOSIS — O368390 Maternal care for abnormalities of the fetal heart rate or rhythm, unspecified trimester, not applicable or unspecified: Secondary | ICD-10-CM | POA: Insufficient documentation

## 2012-02-05 DIAGNOSIS — O4700 False labor before 37 completed weeks of gestation, unspecified trimester: Secondary | ICD-10-CM | POA: Insufficient documentation

## 2012-02-05 DIAGNOSIS — O4290 Premature rupture of membranes, unspecified as to length of time between rupture and onset of labor, unspecified weeks of gestation: Secondary | ICD-10-CM

## 2012-02-05 HISTORY — DX: Trichomoniasis, unspecified: A59.9

## 2012-02-05 HISTORY — DX: Urinary tract infection, site not specified: N39.0

## 2012-02-05 HISTORY — DX: Female pelvic inflammatory disease, unspecified: N73.9

## 2012-02-05 LAB — WET PREP, GENITAL
Trich, Wet Prep: NONE SEEN
Yeast Wet Prep HPF POC: NONE SEEN

## 2012-02-05 LAB — URINALYSIS, ROUTINE W REFLEX MICROSCOPIC
Glucose, UA: 100 mg/dL — AB
Leukocytes, UA: NEGATIVE
Protein, ur: NEGATIVE mg/dL
Specific Gravity, Urine: 1.01 (ref 1.005–1.030)
Urobilinogen, UA: 0.2 mg/dL (ref 0.0–1.0)

## 2012-02-05 LAB — URINE MICROSCOPIC-ADD ON

## 2012-02-05 MED ORDER — NIFEDIPINE 10 MG PO CAPS
ORAL_CAPSULE | ORAL | Status: AC
  Administered 2012-02-05: 10 mg via ORAL
  Filled 2012-02-05: qty 1

## 2012-02-05 MED ORDER — NIFEDIPINE 10 MG PO CAPS
10.0000 mg | ORAL_CAPSULE | ORAL | Status: DC | PRN
  Administered 2012-02-05 (×2): 10 mg via ORAL
  Filled 2012-02-05: qty 1

## 2012-02-05 MED ORDER — PANTOPRAZOLE SODIUM 40 MG PO TBEC
40.0000 mg | DELAYED_RELEASE_TABLET | ORAL | Status: AC
  Administered 2012-02-05: 40 mg via ORAL
  Filled 2012-02-05: qty 1

## 2012-02-05 NOTE — [facility]U Note (Signed)
Around 0930 was vomiting had a gush of fluid, later had some mucous d/c then started cramping.  No more fluid, mucous d/c continues.

## 2012-02-05 NOTE — Discharge Instructions (Signed)

## 2012-02-05 NOTE — MAU Provider Note (Signed)
History   31 yo G1P0 at 63 3/7 weeks presented after calling office with ? Leaking episode after gagging x 2, then onset of contractions after that.  Seen at [facility] on 5/21 for PT UCs, with negative FFN--UCs responded to Stadol.  Patient Active Problem List  Diagnoses  . Chronic pelvic pain in female  . Interstitial cystitis  . Dysmenorrhea  . IBS (irritable bowel syndrome)  . Panic attacks  . Depression  . Fibromyalgia  . Migraine headache  . Fibroids  . Preterm uterine contractions, antepartum  . Umbilical hernia  Bipolar   Chief Complaint  Patient presents with  . Labor Eval   @SFHPI @  OB History    Grav Para Term Preterm Abortions TAB SAB Ect Mult Living   1 0 0 0 0 0 0 0 0 0       Past Medical History  Diagnosis Date  . Neuromuscular disorder     fibromyalagia  . Back pain   . Gastritis   . Interstitial cystitis   . Endometrial cyst of ovary   . Anxiety     pt states she experiences panic attacks  . GERD (gastroesophageal reflux disease)   . Migraine   . Hernia, hiatal   . Fibroid   . Panic attack   . Panic attacks 01/09/2012  . Fibromyalgia   . Preterm labor   . Urinary tract infection   . Fibromyalgia   . PID (pelvic inflammatory disease)   . Trichomonas   . Depression     seeing a therapist    Past Surgical History  Procedure Date  . Laparoscopy 06/18/2011    Procedure: LAPAROSCOPY DIAGNOSTIC;  Surgeon: Hollie Salk C. Marice Potter, MD;  Location: [facility];  Service: Gynecology;  Laterality: Bilateral;  with bilateral chromopertubation with excision of endometrial biopsy  . Laparoscopic endometriosis fulguration 06/2011    Family History  Problem Relation Age of Onset  . Fibroids Mother   . Hypertension Mother   . Hypertension Father   . Diabetes Father   . Kidney disease Father     kidney stones  . Gout Father   . Anesthesia problems Neg Hx     History  Substance Use Topics  . Smoking status: Never Smoker   . Smokeless tobacco: Never Used  . Alcohol  Use: No    Allergies:  Allergies  Allergen Reactions  . Percocet (Oxycodone-Acetaminophen) Itching    Prescriptions prior to admission  Medication Sig Dispense Refill  . calcium carbonate (TUMS - DOSED IN MG ELEMENTAL CALCIUM) 500 MG chewable tablet Chew 2 tablets by mouth daily as needed. For heartburn      . Camphor-Eucalyptus-Menthol (VICKS VAPORUB EX) Apply 1 application topically daily as needed. For stuffy nose       . diphenhydramine-acetaminophen (TYLENOL PM) 25-500 MG TABS Take 1 tablet by mouth at bedtime as needed. For migraines      . ferrous sulfate 325 (65 FE) MG tablet Take 325 mg by mouth at bedtime.       Marland Kitchen NIFEdipine (PROCARDIA) 10 MG capsule Take 10 mg by mouth every 6 (six) hours. Pt states that she got heart palpitations when she took this medication, so she stopped taking two days after starting. ( 01/24/12)      . Prenatal Vit-Fe Fumarate-FA (PRENATAL MULTIVITAMIN) TABS Take 1 tablet by mouth at bedtime.         @ROS @ Physical Exam   Blood pressure 125/77, pulse 124, temperature 98.4 F (36.9 C), temperature source Oral,  resp. rate 20, height 5' 6.5" (1.689 m), weight 170 lb (77.111 kg), last menstrual period 06/23/2011.  Chest clear Heart RRR without murmur Abd gravid, NT Pelvic--small amount white thin d/c in vault, no pooling Cervix closed, long, vtx -2 Ext WNL  FHR overall non-reactive, but no decels, few 10x10 accels Significant irritability noted.  ED Course  IUP at 32 3/7 weeks PTL ? Leaking Non-reactive NST  Plan: Procardia 10 mg regimen q 20 min x 4 doses FFN, amnisure, GBS, wet prep, GBS done. OB US for BPP, growth, cervical length (last [place] in early May).  Nigel Bridgeman, CNM, [place] 02/05/12 10:43am

## 2012-02-05 NOTE — Telephone Encounter (Signed)
Meredith Howard addressing pt now

## 2012-02-05 NOTE — Telephone Encounter (Signed)
PT CALLED, IS 32 WKS 3 DAYS C/O VOMITING THIS AM, WHEN VOMITING NOTICED SOME LOF FOLLOWED BY SOME MUCUSY D/C, ALSO HAVING CTXS 3-4 MIN APART FOR PAST 45 MIN AND FEELS LIKE CRAMPING, HAS BEEN IN HOSPITAL BEFORE FOR PTL, WAS TOLD TO STOP MEDS, PER VL PT ADVISED TO GO TO MAU, VOICES UNDERSTANDING.

## 2012-02-07 ENCOUNTER — Ambulatory Visit (INDEPENDENT_AMBULATORY_CARE_PROVIDER_SITE_OTHER): Payer: Medicaid Other | Admitting: Obstetrics and Gynecology

## 2012-02-07 VITALS — BP 100/56 | Wt 171.0 lb

## 2012-02-07 DIAGNOSIS — Z331 Pregnant state, incidental: Secondary | ICD-10-CM

## 2012-02-07 LAB — CULTURE, BETA STREP (GROUP B ONLY)

## 2012-02-07 NOTE — Progress Notes (Signed)
Pt stated having some lower pain,back pain and lower side pain

## 2012-02-07 NOTE — Progress Notes (Signed)
FFN negative 02/05/12 No contractions No D/C. No bleeding. No LOF. C/O back pain

## 2012-02-14 ENCOUNTER — Telehealth: Payer: Self-pay

## 2012-02-14 NOTE — Telephone Encounter (Signed)
Pt called to report >6ctxs per hr x2 hrs, "migraine," and lower back pain.  Calling to see if she should take her "procardia."  H/o preterm ctxs, but closed cx and neg FFN's.  No BMZ.  Pt has had inadequate water intake today.  No VB or LOF.  GFM.  Rec'd pt try one dose of Procardia and 2 ES Tylenol now or could try Flexeril as substitute for Tylenol.  Also rec'd 2 glasses of water over next 30 minutes.  If no improvement of s/s over next 1-2 hrs, call back and rec'd MAU eval.  Pt agreeable w/ plan.  F/u prn.

## 2012-02-18 ENCOUNTER — Telehealth: Payer: Self-pay | Admitting: Obstetrics and Gynecology

## 2012-02-18 NOTE — Telephone Encounter (Signed)
Spoke with pt and advised per VL that she can take the muscle relaxer flexeril 10 mg that she was given in the hospital.

## 2012-02-18 NOTE — Telephone Encounter (Signed)
Triage/epic 

## 2012-02-21 ENCOUNTER — Ambulatory Visit (INDEPENDENT_AMBULATORY_CARE_PROVIDER_SITE_OTHER): Payer: Medicaid Other | Admitting: Obstetrics and Gynecology

## 2012-02-21 ENCOUNTER — Encounter: Payer: Self-pay | Admitting: Obstetrics and Gynecology

## 2012-02-21 VITALS — BP 110/62 | Wt 174.0 lb

## 2012-02-21 DIAGNOSIS — Z331 Pregnant state, incidental: Secondary | ICD-10-CM

## 2012-02-21 DIAGNOSIS — R002 Palpitations: Secondary | ICD-10-CM

## 2012-02-21 LAB — THYROID PANEL WITH TSH: T3 Uptake: <15 % — ABNORMAL LOW (ref 22.5–37.0)

## 2012-02-21 LAB — CBC
Hemoglobin: 12.8 g/dL (ref 12.0–15.0)
MCV: 92 fL (ref 78.0–100.0)
Platelets: 271 10*3/uL (ref 150–400)
RBC: 4.23 MIL/uL (ref 3.87–5.11)
WBC: 7.3 10*3/uL (ref 4.0–10.5)
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
## 2012-02-21 NOTE — Progress Notes (Signed)
Multiple complains/ daily palps,some cramping,carpel tunnel Lt wrist. Issues reviewed Check Thyroid panel, CBC Refer to Cardiologist CX closed Support for family stress.

## 2012-02-22 ENCOUNTER — Telehealth: Payer: Self-pay

## 2012-02-22 ENCOUNTER — Other Ambulatory Visit: Payer: Self-pay

## 2012-02-22 DIAGNOSIS — R002 Palpitations: Secondary | ICD-10-CM

## 2012-02-22 NOTE — Telephone Encounter (Signed)
Ref to [facility] lm on pt vm appt 02/25/12 at 3:15 number given [phone] cal if not able to keep appt

## 2012-02-22 NOTE — Telephone Encounter (Signed)
Message copied by Rolla Plate on Fri Feb 22, 2012  9:48 AM ------      Message from: Cornelius Moras      Created: Thu Feb 21, 2012 11:50 PM      Regarding: Referral to cardiologist       Please refer to cardiologist for frequent palpitations--Had CBC and thyroid panel today.

## 2012-02-22 NOTE — Telephone Encounter (Signed)
Pt called back returning NG/CMA call. I made pt aware of Cardiology Referral and appt time and a phone number to call if she needed to change her appt. A detailed vm was left on pt voicemail as well by NG confirming her appt. Pt voiced understanding.[facility] CMA

## 2012-02-25 ENCOUNTER — Telehealth: Payer: Self-pay | Admitting: Obstetrics and Gynecology

## 2012-02-25 ENCOUNTER — Institutional Professional Consult (permissible substitution): Payer: Medicaid Other | Admitting: Cardiovascular Disease

## 2012-02-25 NOTE — Telephone Encounter (Signed)
Per message from VL, after consult with Dr [facility], Cardiology appt cancelled.  Per recommendation, pt to be referred to endocrinologist.  Due to insurance , pt scheduled at [facility], [address], 02/2612 at 2:45.   Pt informed and is agreeable.  States+FM.  Denies cont.  States is occasionally having "stomach pains" that feels as if may have diarrhea.  Normal BM.   Eating and drinking as normal.  To call with S&S of labor or if discomfort continues.   Pt verbalizes comprehension.

## 2012-02-26 ENCOUNTER — Other Ambulatory Visit: Payer: Self-pay | Admitting: Obstetrics and Gynecology

## 2012-02-26 ENCOUNTER — Telehealth: Payer: Self-pay | Admitting: Obstetrics and Gynecology

## 2012-02-26 ENCOUNTER — Ambulatory Visit (INDEPENDENT_AMBULATORY_CARE_PROVIDER_SITE_OTHER): Payer: Medicaid Other | Admitting: Obstetrics and Gynecology

## 2012-02-26 ENCOUNTER — Encounter: Payer: Self-pay | Admitting: Obstetrics and Gynecology

## 2012-02-26 ENCOUNTER — Ambulatory Visit (INDEPENDENT_AMBULATORY_CARE_PROVIDER_SITE_OTHER): Payer: Medicaid Other

## 2012-02-26 VITALS — BP 120/62 | Wt 174.0 lb

## 2012-02-26 DIAGNOSIS — Z3490 Encounter for supervision of normal pregnancy, unspecified, unspecified trimester: Secondary | ICD-10-CM

## 2012-02-26 DIAGNOSIS — O368190 Decreased fetal movements, unspecified trimester, not applicable or unspecified: Secondary | ICD-10-CM

## 2012-02-26 DIAGNOSIS — E0590 Thyrotoxicosis, unspecified without thyrotoxic crisis or storm: Secondary | ICD-10-CM

## 2012-02-26 DIAGNOSIS — O288 Other abnormal findings on antenatal screening of mother: Secondary | ICD-10-CM

## 2012-02-26 DIAGNOSIS — O99280 Endocrine, nutritional and metabolic diseases complicating pregnancy, unspecified trimester: Secondary | ICD-10-CM

## 2012-02-26 DIAGNOSIS — Z331 Pregnant state, incidental: Secondary | ICD-10-CM

## 2012-02-26 DIAGNOSIS — O368390 Maternal care for abnormalities of the fetal heart rate or rhythm, unspecified trimester, not applicable or unspecified: Secondary | ICD-10-CM

## 2012-02-26 DIAGNOSIS — E079 Disorder of thyroid, unspecified: Secondary | ICD-10-CM

## 2012-02-26 NOTE — Progress Notes (Signed)
NST today for decreased FM--now feeling adequate movement. NST reassuring, but non-reactive.  No UCS BPP = 8/8, AFI 18.06, 70%ile, vtx, cervix 3.32.  Reviewed FKCs, thyroid panel results. Has appointment with endocrinologist tomorrow due to ? Hyperthyroidism on last labs. We elected to defer cardiologist referral in lieu of referral to endocrinologist because of thyroid panel. Patient to keep ROB scheduled for next week.

## 2012-02-26 NOTE — Progress Notes (Signed)
NST only non reactive BPP done today

## 2012-02-26 NOTE — Telephone Encounter (Signed)
TC from pt.  States not feeling well today.  Is feeling weak.  Also decreased FM even though has been eating and drinking.  Per VL to office for NST.

## 2012-03-05 ENCOUNTER — Telehealth: Payer: Self-pay | Admitting: Obstetrics and Gynecology

## 2012-03-05 ENCOUNTER — Encounter: Payer: Self-pay | Admitting: Obstetrics and Gynecology

## 2012-03-05 ENCOUNTER — Encounter: Payer: Medicaid Other | Admitting: Obstetrics and Gynecology

## 2012-03-05 ENCOUNTER — Ambulatory Visit (INDEPENDENT_AMBULATORY_CARE_PROVIDER_SITE_OTHER): Payer: Medicaid Other | Admitting: Obstetrics and Gynecology

## 2012-03-05 VITALS — BP 108/60 | Wt 174.0 lb

## 2012-03-05 DIAGNOSIS — Z331 Pregnant state, incidental: Secondary | ICD-10-CM

## 2012-03-05 NOTE — Progress Notes (Signed)
No complaints LOF 2 days ago?  Fern negative   Nitrazine negative

## 2012-03-05 NOTE — Telephone Encounter (Signed)
States +FM. Reviewed s/s uc, srom, vag bleeding, daily fetal kick counts to report, comfort measures. Encouragged 8 water daily and frequent voids. May come for evaluation plans to try comfort measures now. Lavera Guise, CNM

## 2012-03-05 NOTE — Progress Notes (Signed)
Pt states she thought she was leaking fluid this past Sunday, requests cervix check.

## 2012-03-07 LAB — STREP B DNA PROBE: GBSP: NEGATIVE

## 2012-03-08 ENCOUNTER — Inpatient Hospital Stay (HOSPITAL_COMMUNITY)
Admission: AD | Admit: 2012-03-08 | Discharge: 2012-03-08 | Disposition: A | Discharge status: Home / Self Care | Payer: Medicaid Other | Source: Ambulatory Visit | Attending: Obstetrics and Gynecology | Admitting: Obstetrics and Gynecology

## 2012-03-08 ENCOUNTER — Encounter (HOSPITAL_COMMUNITY): Payer: Self-pay

## 2012-03-08 ENCOUNTER — Telehealth: Payer: Self-pay | Admitting: Obstetrics and Gynecology

## 2012-03-08 DIAGNOSIS — O4700 False labor before 37 completed weeks of gestation, unspecified trimester: Secondary | ICD-10-CM

## 2012-03-08 DIAGNOSIS — E0590 Thyrotoxicosis, unspecified without thyrotoxic crisis or storm: Secondary | ICD-10-CM

## 2012-03-08 DIAGNOSIS — O479 False labor, unspecified: Secondary | ICD-10-CM | POA: Insufficient documentation

## 2012-03-08 DIAGNOSIS — K429 Umbilical hernia without obstruction or gangrene: Secondary | ICD-10-CM

## 2012-03-08 MED ORDER — NALBUPHINE HCL 10 MG/ML IJ SOLN
10.0000 mg | Freq: Once | INTRAMUSCULAR | Status: AC
  Administered 2012-03-08: 10 mg via SUBCUTANEOUS
  Filled 2012-03-08: qty 1

## 2012-03-08 MED ORDER — ZOLPIDEM TARTRATE 10 MG PO TABS: 10.0000 mg | ORAL_TABLET | Freq: Every evening | ORAL | Status: DC | PRN

## 2012-03-08 MED ORDER — LACTATED RINGERS IV BOLUS (SEPSIS)
500.0000 mL | Freq: Once | INTRAVENOUS | Status: AC
  Administered 2012-03-08: 500 mL via INTRAVENOUS

## 2012-03-08 MED ORDER — LACTATED RINGERS IV SOLN: INTRAVENOUS | Status: DC

## 2012-03-08 MED ORDER — NALBUPHINE HCL 10 MG/ML IJ SOLN
10.0000 mg | Freq: Once | INTRAMUSCULAR | Status: AC
  Administered 2012-03-08: 10 mg via INTRAVENOUS
  Filled 2012-03-08: qty 1

## 2012-03-08 NOTE — Telephone Encounter (Signed)
TC from patient--37 weeks , G1P0,.  Frequent contractions since 10 pm, Very uncomfortable.  No leaking or bleeding, reports +FM.  Come to MAU.

## 2012-03-08 NOTE — MAU Provider Note (Signed)
History   31 yo G1P0 at 37 weeks presented c/o frequent UCs since 10pm.  Denies leaking or bleeding, reports +FM.  Cervix has been 1 cm, 50% in office this week.  Pregnancy remarkable for: Patient Active Problem List  Diagnosis  . Chronic pelvic pain in female  . Interstitial cystitis  . Dysmenorrhea  . IBS (irritable bowel syndrome)  . Panic attacks  . Depression  . Fibromyalgia  . Migraine headache  . Fibroids  . Preterm uterine contractions, antepartum  . Umbilical hernia  . Hyperthyroidism complicating pregnancy     Chief Complaint  Patient presents with  . Labor Eval     OB History    Grav Para Term Preterm Abortions TAB SAB Ect Mult Living   1 0 0 0 0 0 0 0 0 0       Past Medical History  Diagnosis Date  . Neuromuscular disorder     fibromyalagia  . Back pain   . Gastritis   . Interstitial cystitis   . Endometrial cyst of ovary   . Anxiety     pt states she experiences panic attacks  . GERD (gastroesophageal reflux disease)   . Migraine   . Hernia, hiatal   . Fibroid   . Panic attack   . Panic attacks 01/09/2012  . Fibromyalgia   . Preterm labor   . Urinary tract infection   . Fibromyalgia   . PID (pelvic inflammatory disease)   . Trichomonas   . Depression     seeing a therapist    Past Surgical History  Procedure Date  . Laparoscopy 06/18/2011    Procedure: LAPAROSCOPY DIAGNOSTIC;  Surgeon: Hollie Salk C. Marice Potter, MD;  Location: [facility];  Service: Gynecology;  Laterality: Bilateral;  with bilateral chromopertubation with excision of endometrial biopsy  . Laparoscopic endometriosis fulguration 06/2011    Family History  Problem Relation Age of Onset  . Fibroids Mother   . Hypertension Mother   . Hypertension Father   . Diabetes Father   . Kidney disease Father     kidney stones  . Gout Father   . Anesthesia problems Neg Hx   . Other Neg Hx     History  Substance Use Topics  . Smoking status: Never Smoker   . Smokeless tobacco: Never Used  .  Alcohol Use: No    Allergies:  Allergies  Allergen Reactions  . Percocet (Oxycodone-Acetaminophen) Itching    Prescriptions prior to admission  Medication Sig Dispense Refill  . calcium carbonate (TUMS - DOSED IN MG ELEMENTAL CALCIUM) 500 MG chewable tablet Chew 2 tablets by mouth daily as needed. For heartburn      . Camphor-Eucalyptus-Menthol (VICKS VAPORUB EX) Apply 1 application topically daily as needed. For stuffy nose       . diphenhydramine-acetaminophen (TYLENOL PM) 25-500 MG TABS Take 1 tablet by mouth at bedtime as needed. For migraines      . ferrous sulfate 325 (65 FE) MG tablet Take 325 mg by mouth at bedtime.       Marland Kitchen NIFEdipine (PROCARDIA) 10 MG capsule Take 10 mg by mouth every 6 (six) hours. Pt states that she got heart palpitations when she took this medication, so she stopped taking two days after starting. ( 01/24/12)      . Prenatal Vit-Fe Fumarate-FA (PRENATAL MULTIVITAMIN) TABS Take 1 tablet by mouth at bedtime.          Physical Exam   Blood pressure 114/75, pulse 111, temperature 98 F (36.7  C), temperature source Oral, resp. rate 20, height 5' 6.5" (1.689 m), weight 174 lb (78.926 kg), last menstrual period 06/23/2011.  Appears uncomfortable with contractions. Chest clear Heart RRR without murmur Abd gravid, NT Pelvic--1 cm, 75%, vtx, -2 Ext WNL  FHR reactive, no decels UCs q 1-2 min, mild  ED Course  IUP at 37 weeks Early vs. Prodromal labor GBS negative.  Plan: IV hydration Observation for cervical change Therapeutic rest prn, if no cervical change.   Ruhama Lehew CNM, [place] 03/08/2012 1:39 AM    Addendum: UCs seem to have diminished after IV hydration, but patient still uncomfortable. Will do therapeutic rest with Nubain 10 mg IV and 10 mg SQ, then observe.  Nigel Bridgeman, CNM, [place] 03/08/12 2a  Addendum: Rested after Nubain doses, now more awake. Reports Nubain made her feel groggy, but still aware of contractions. Pattern of  irritability, with sporadic more defined contractions. FHR reassuring, no decels. Cervix 1 cm, 70%, vtx, -1--essentially no change from initial exam.  Plan: In light of [redacted] week gestation, will send home to await increased organization of contractions. S/S of advancing labor reviewed.  Patient will call for SROM, active bleeding, decreased FM, or increase in frequency/intensity of contractions, or prn. Reports office is to call her with an appointment for next week. Rx Ambien 10 mg po q hs prn, #10, no refills.  Nigel Bridgeman, CNM, [place] 03/08/12 4:25a

## 2012-03-08 NOTE — MAU Note (Signed)
Contractions every 2-3 minutes since 10pm last night. Denies leaking of fluid or vaginal bleeding. Positive fetal movement. Was 1cm in office.

## 2012-03-09 ENCOUNTER — Inpatient Hospital Stay (HOSPITAL_COMMUNITY)
Admission: AD | Admit: 2012-03-09 | Discharge: 2012-03-09 | Disposition: A | Discharge status: Home / Self Care | Payer: Medicaid Other | Source: Ambulatory Visit | Attending: Obstetrics and Gynecology | Admitting: Obstetrics and Gynecology

## 2012-03-09 DIAGNOSIS — O99280 Endocrine, nutritional and metabolic diseases complicating pregnancy, unspecified trimester: Secondary | ICD-10-CM

## 2012-03-09 DIAGNOSIS — N949 Unspecified condition associated with female genital organs and menstrual cycle: Secondary | ICD-10-CM | POA: Insufficient documentation

## 2012-03-09 DIAGNOSIS — O4700 False labor before 37 completed weeks of gestation, unspecified trimester: Secondary | ICD-10-CM

## 2012-03-09 DIAGNOSIS — O479 False labor, unspecified: Secondary | ICD-10-CM

## 2012-03-09 DIAGNOSIS — K429 Umbilical hernia without obstruction or gangrene: Secondary | ICD-10-CM

## 2012-03-09 NOTE — MAU Note (Signed)
"  The cramping and increased pressure started about 3 hours ago.  It is worse now.  No bleeding or LOF.  I've just been going to the BR every 15 minutes. (+) FM "a lot of movement about an hour ago.  He's calmed down now."

## 2012-03-09 NOTE — MAU Note (Addendum)
Pt reports having a lot of cramping and pelvic pressure. Good fetal movement and denies leaking or vag bleeding. Pt reports having increase ankle swelling with some numbness.

## 2012-03-09 NOTE — MAU Provider Note (Signed)
History   Meredith Howard is a 31y.o. BF who presents for labor check w/ CC of increased pelvic pressure and pain and increase in ctxs over last 3 hrs.  Intermittent sharp, shooting pain in pelvis and vagina.  No LOF or VB.  GFM.  Pt seen Friday night and cx 1/50.  Accompanied by s.o.  Reports more swelling in LE's.  No PIH s/s.  No other c/o's.  Doesn't yet have appt scheduled for this week.  Pt received IVF and pain medicine Friday night, but reports it made her "sick."   .Marland Kitchen Patient Active Problem List  Diagnosis  . Chronic pelvic pain in female  . Interstitial cystitis  . Dysmenorrhea  . IBS (irritable bowel syndrome)  . Panic attacks  . Depression  . Fibromyalgia  . Migraine headache  . Fibroids  . Preterm uterine contractions, antepartum  . Umbilical hernia  . Hyperthyroidism complicating pregnancy   CSN: [id number]  Arrival date and time: 03/09/12 1827   First Provider Initiated Contact with Patient 03/09/12 1944      Chief Complaint  Patient presents with  . Contractions   HPI  OB History    Grav Para Term Preterm Abortions TAB SAB Ect Mult Living   1 0 0 0 0 0 0 0 0 0       Past Medical History  Diagnosis Date  . Neuromuscular disorder     fibromyalagia  . Back pain   . Gastritis   . Interstitial cystitis   . Endometrial cyst of ovary   . Anxiety     pt states she experiences panic attacks  . GERD (gastroesophageal reflux disease)   . Migraine   . Hernia, hiatal   . Fibroid   . Panic attack   . Panic attacks 01/09/2012  . Fibromyalgia   . Preterm labor   . Urinary tract infection   . Fibromyalgia   . PID (pelvic inflammatory disease)   . Trichomonas   . Depression     seeing a therapist    Past Surgical History  Procedure Date  . Laparoscopy 06/18/2011    Procedure: LAPAROSCOPY DIAGNOSTIC;  Surgeon: Hollie Salk C. Marice Potter, MD;  Location: [facility];  Service: Gynecology;  Laterality: Bilateral;  with bilateral chromopertubation with excision of endometrial biopsy   . Laparoscopic endometriosis fulguration 06/2011    Family History  Problem Relation Age of Onset  . Fibroids Mother   . Hypertension Mother   . Hypertension Father   . Diabetes Father   . Kidney disease Father     kidney stones  . Gout Father   . Anesthesia problems Neg Hx   . Other Neg Hx     History  Substance Use Topics  . Smoking status: Never Smoker   . Smokeless tobacco: Never Used  . Alcohol Use: No    Allergies:  Allergies  Allergen Reactions  . Ambien (Zolpidem Tartrate) Nausea And Vomiting and Other (See Comments)    Light-headedness; difficulty voiding urine  . Percocet (Oxycodone-Acetaminophen) Itching    Prescriptions prior to admission  Medication Sig Dispense Refill  . calcium carbonate (TUMS - DOSED IN MG ELEMENTAL CALCIUM) 500 MG chewable tablet Chew 2 tablets by mouth daily as needed. For heartburn      . Camphor-Eucalyptus-Menthol (VICKS VAPORUB EX) Apply 1 application topically daily as needed. For stuffy nose       . diphenhydramine-acetaminophen (TYLENOL PM) 25-500 MG TABS Take 1 tablet by mouth at bedtime as needed. For migraines      .  ferrous sulfate 325 (65 FE) MG tablet Take 325 mg by mouth at bedtime.       Marland Kitchen OVER THE COUNTER MEDICATION Apply 1 application topically 2 (two) times daily. Antifungal for ringworm      . Prenatal Vit-Fe Fumarate-FA (PRENATAL MULTIVITAMIN) TABS Take 1 tablet by mouth at bedtime.         ROS--see HPI above Physical Exam   Blood pressure 124/71, temperature 98.6 F (37 C), temperature source Oral, resp. rate 18, height 5' 6.5" (1.689 m), weight 176 lb 6.4 oz (80.015 kg), last menstrual period 06/23/2011.  Physical Exam  Constitutional: She is oriented to person, place, and time. She appears well-developed and well-nourished. No distress.       Pt was laughing w/ s.o. On my arrival to room for cervical check  HENT:  Head: Normocephalic.  Eyes: Pupils are equal, round, and reactive to light.  Cardiovascular:  Normal rate.   Respiratory: Effort normal.  GI: Soft.       gravid  Genitourinary:       Cx:  Tight 1/60/-2 to -1 anterior  Musculoskeletal: She exhibits edema.  Neurological: She is alert and oriented to person, place, and time.  Skin: Skin is warm and dry.  EFM:  130-135, reactive, no decels, moderate variability TOCO:  irreg ctxs some some UI  MAU Course  Procedures 1.  NST  Assessment and Plan  1.  [redacted]w[redacted]d 2.  False labor 3.  Reactive NST 4.  H/o preterm ctxs, now term 5.  H/o panic disorder  1.  D/c home w/ labor precautions and FKC 2.  rec'd if pt doesn't hear from office by early afternoon tomorrow, to call to schedule this week's appt 3.  F/u prn; pt declined ambien or other pain interventions  Kerby Hockley H 03/09/2012, 8:24 PM

## 2012-03-11 ENCOUNTER — Ambulatory Visit (INDEPENDENT_AMBULATORY_CARE_PROVIDER_SITE_OTHER): Payer: Medicaid Other | Admitting: Obstetrics and Gynecology

## 2012-03-11 VITALS — BP 100/62 | Wt 174.0 lb

## 2012-03-11 DIAGNOSIS — Z331 Pregnant state, incidental: Secondary | ICD-10-CM

## 2012-03-11 NOTE — Progress Notes (Signed)
Pt stated no issues today . Pt wants her cervix check .

## 2012-03-11 NOTE — Progress Notes (Signed)
Good FA. Increased contractions and discharge. No bleeding. No LOF.

## 2012-03-13 ENCOUNTER — Encounter: Payer: Self-pay | Admitting: Obstetrics and Gynecology

## 2012-03-13 ENCOUNTER — Telehealth: Payer: Self-pay | Admitting: Obstetrics and Gynecology

## 2012-03-13 ENCOUNTER — Ambulatory Visit (INDEPENDENT_AMBULATORY_CARE_PROVIDER_SITE_OTHER): Payer: Medicaid Other | Admitting: Obstetrics and Gynecology

## 2012-03-13 VITALS — BP 108/64 | Wt 175.0 lb

## 2012-03-13 DIAGNOSIS — Z3490 Encounter for supervision of normal pregnancy, unspecified, unspecified trimester: Secondary | ICD-10-CM

## 2012-03-13 DIAGNOSIS — Z331 Pregnant state, incidental: Secondary | ICD-10-CM

## 2012-03-13 NOTE — Progress Notes (Signed)
Called earlier in am with complaint of decreased fetal movement. [redacted]w[redacted]d Fetal movement in pm which the patient associates with normal movement for her baby. FM ++ during examination. Patient reassured  F/u  6 days

## 2012-03-13 NOTE — Progress Notes (Signed)
Pt is here for decreased fetal movement

## 2012-03-13 NOTE — Telephone Encounter (Signed)
Spoke with pt rgd concerns pt states having contractions every 2 mins for aprox an hr pt states no spotting no bleeding no eaking fluid pt states baby moving this morning but not so much now consult with midwife pt to have appt for eval 03/13/12 at 3:45 with Angelique Blonder CNM pt voice understanding

## 2012-03-19 ENCOUNTER — Ambulatory Visit (INDEPENDENT_AMBULATORY_CARE_PROVIDER_SITE_OTHER): Payer: Medicaid Other | Admitting: Obstetrics and Gynecology

## 2012-03-19 VITALS — BP 118/64 | Wt 176.0 lb

## 2012-03-19 DIAGNOSIS — Z331 Pregnant state, incidental: Secondary | ICD-10-CM

## 2012-03-19 NOTE — Progress Notes (Signed)
Doing well. Return office in 1 week. Dr. Sunday Klos 

## 2012-03-19 NOTE — Progress Notes (Signed)
Pt is C/O contractions 4 mins apart.  Cervix Check today.

## 2012-03-20 ENCOUNTER — Telehealth: Payer: Self-pay | Admitting: Obstetrics and Gynecology

## 2012-03-20 NOTE — Telephone Encounter (Signed)
Pt called, is 38w 5d, states woke up this am with a severe HA, blurred vision, dizziness, weakness, and nausea, denies any bldg/LOF, has had 3 glasses of water so far and has eaten a bagel, juice and [place] food.  Does have + fm, also states has had 6 ctxs in 15 minutes.  Last appt was yesterday.  Per SL advised pt she may either take Tylenol and po hydrate or if she thinks she needs to be seen to come to MAU.  Pt states will po hydrate for a bit to see if ctxs decrease, if no improvement, will call to let us know that she is going to MAU.

## 2012-03-21 ENCOUNTER — Telehealth: Payer: Self-pay | Admitting: Obstetrics and Gynecology

## 2012-03-21 ENCOUNTER — Encounter (HOSPITAL_COMMUNITY): Payer: Self-pay | Admitting: *Deleted

## 2012-03-21 ENCOUNTER — Inpatient Hospital Stay (HOSPITAL_COMMUNITY)
Admission: AD | Admit: 2012-03-21 | Discharge: 2012-03-21 | Disposition: A | Discharge status: Home / Self Care | Payer: Medicaid Other | Source: Ambulatory Visit | Attending: Obstetrics and Gynecology | Admitting: Obstetrics and Gynecology

## 2012-03-21 DIAGNOSIS — O4700 False labor before 37 completed weeks of gestation, unspecified trimester: Secondary | ICD-10-CM

## 2012-03-21 DIAGNOSIS — K429 Umbilical hernia without obstruction or gangrene: Secondary | ICD-10-CM

## 2012-03-21 DIAGNOSIS — O479 False labor, unspecified: Secondary | ICD-10-CM | POA: Insufficient documentation

## 2012-03-21 DIAGNOSIS — E0590 Thyrotoxicosis, unspecified without thyrotoxic crisis or storm: Secondary | ICD-10-CM

## 2012-03-21 NOTE — Telephone Encounter (Signed)
TC from pt c/o ctx 3 min apart, is coming to MAU

## 2012-03-21 NOTE — MAU Note (Signed)
Contractions x 2 hours. Denies bleeding or ROM

## 2012-03-21 NOTE — Progress Notes (Signed)
History   uc contractions since 0300 feeling them in back and front, denies srom or vag bleeding,with +FM.  Chief Complaint  Patient presents with  . Labor Eval   @SFHPI @  OB History    Grav Para Term Preterm Abortions TAB SAB Ect Mult Living   1 0 0 0 0 0 0 0 0 0       Past Medical History  Diagnosis Date  . Neuromuscular disorder     fibromyalagia  . Back pain   . Gastritis   . Interstitial cystitis   . Endometrial cyst of ovary   . Anxiety     pt states she experiences panic attacks  . GERD (gastroesophageal reflux disease)   . Migraine   . Hernia, hiatal   . Fibroid   . Panic attack   . Panic attacks 01/09/2012  . Fibromyalgia   . Preterm labor   . Urinary tract infection   . Fibromyalgia   . PID (pelvic inflammatory disease)   . Trichomonas   . Depression     seeing a therapist    Past Surgical History  Procedure Date  . Laparoscopy 06/18/2011    Procedure: LAPAROSCOPY DIAGNOSTIC;  Surgeon: Hollie Salk C. Marice Potter, MD;  Location: [facility];  Service: Gynecology;  Laterality: Bilateral;  with bilateral chromopertubation with excision of endometrial biopsy  . Laparoscopic endometriosis fulguration 06/2011    Family History  Problem Relation Age of Onset  . Fibroids Mother   . Hypertension Mother   . Hypertension Father   . Diabetes Father   . Kidney disease Father     kidney stones  . Gout Father   . Anesthesia problems Neg Hx   . Other Neg Hx     History  Substance Use Topics  . Smoking status: Never Smoker   . Smokeless tobacco: Never Used  . Alcohol Use: No    Allergies:  Allergies  Allergen Reactions  . Percocet (Oxycodone-Acetaminophen) Itching    Prescriptions prior to admission  Medication Sig Dispense Refill  . calcium carbonate (TUMS - DOSED IN MG ELEMENTAL CALCIUM) 500 MG chewable tablet Chew 2 tablets by mouth daily as needed. For heartburn      . Camphor-Eucalyptus-Menthol (VICKS VAPORUB EX) Apply 1 application topically daily as needed. For  stuffy nose       . diphenhydramine-acetaminophen (TYLENOL PM) 25-500 MG TABS Take 1 tablet by mouth at bedtime as needed. For migraines      . ferrous sulfate 325 (65 FE) MG tablet Take 325 mg by mouth at bedtime.       . Prenatal Vit-Fe Fumarate-FA (PRENATAL MULTIVITAMIN) TABS Take 1 tablet by mouth at bedtime.         @ROS @ Physical Exam  Calm, no distress,  abd soft nt,no masses,  Normal hair distrubition mons pubis,  Vag 2 80 -2 posterior intact thick mucus white EGBUS WNL,  Trace ankles edema to lower extremities Fhts category 1 uc q 2-3 mild  Blood pressure 101/68, pulse 101, temperature 98.6 F (37 C), temperature source Oral, resp. rate 22, last menstrual period 06/23/2011, SpO2 99.00%. A [redacted]w[redacted]d    Not in active labor    GBS  neg P discharge home, offered po sedation and declines will take benadryl at home states. Reviewed s/s uc, srom, vag bleeding, daily fetal kick counts to report, comfort measures. Encouragged 8 water daily and frequent voids. Lavera Guise, CNM

## 2012-03-24 ENCOUNTER — Telehealth: Payer: Self-pay | Admitting: Obstetrics and Gynecology

## 2012-03-24 NOTE — Telephone Encounter (Signed)
Triage/epic 

## 2012-03-24 NOTE — Telephone Encounter (Signed)
Spoke with pt rgd msg pt wants to drink tyme tea he mom said it was safe to take it from [place] to regulate contractions advised pt do not recommend taking tea not sure of all tthe ingredients are safe for preg can increase water intake and take tylenol if no relief call office pt voice understanding

## 2012-03-25 ENCOUNTER — Inpatient Hospital Stay (HOSPITAL_COMMUNITY)
Admission: AD | Admit: 2012-03-25 | Discharge: 2012-03-26 | Disposition: A | Discharge status: Home / Self Care | Payer: Medicaid Other | Source: Ambulatory Visit | Attending: Obstetrics and Gynecology | Admitting: Obstetrics and Gynecology

## 2012-03-25 ENCOUNTER — Inpatient Hospital Stay (HOSPITAL_COMMUNITY): Payer: Medicaid Other

## 2012-03-25 ENCOUNTER — Encounter (HOSPITAL_COMMUNITY): Payer: Self-pay | Admitting: *Deleted

## 2012-03-25 DIAGNOSIS — E0590 Thyrotoxicosis, unspecified without thyrotoxic crisis or storm: Secondary | ICD-10-CM

## 2012-03-25 DIAGNOSIS — O479 False labor, unspecified: Secondary | ICD-10-CM | POA: Insufficient documentation

## 2012-03-25 DIAGNOSIS — O4700 False labor before 37 completed weeks of gestation, unspecified trimester: Secondary | ICD-10-CM

## 2012-03-25 DIAGNOSIS — K429 Umbilical hernia without obstruction or gangrene: Secondary | ICD-10-CM

## 2012-03-25 NOTE — MAU Note (Signed)
Pt states she started having contractions about ago

## 2012-03-25 NOTE — MAU Note (Signed)
contractions 

## 2012-03-25 NOTE — MAU Provider Note (Signed)
History     CSN: [id number]  Arrival date and time: 03/25/12 2044   None     Chief Complaint  Patient presents with  . Labor Eval   HPI Comments: Pt is a G1P0 at [redacted]w[redacted]d that arrives unannounced w CC ctx, that have been consistent in last 1-2 hours, denies LOF, VB, reports GFM.       Past Medical History  Diagnosis Date  . Neuromuscular disorder     fibromyalagia  . Back pain   . Gastritis   . Interstitial cystitis   . Endometrial cyst of ovary   . Anxiety     pt states she experiences panic attacks  . GERD (gastroesophageal reflux disease)   . Migraine   . Hernia, hiatal   . Fibroid   . Panic attack   . Panic attacks 01/09/2012  . Fibromyalgia   . Preterm labor   . Urinary tract infection   . Fibromyalgia   . PID (pelvic inflammatory disease)   . Trichomonas   . Depression     seeing a therapist    Past Surgical History  Procedure Date  . Laparoscopy 06/18/2011    Procedure: LAPAROSCOPY DIAGNOSTIC;  Surgeon: Hollie Salk C. Marice Potter, MD;  Location: [facility];  Service: Gynecology;  Laterality: Bilateral;  with bilateral chromopertubation with excision of endometrial biopsy  . Laparoscopic endometriosis fulguration 06/2011    Family History  Problem Relation Age of Onset  . Fibroids Mother   . Hypertension Mother   . Hypertension Father   . Diabetes Father   . Kidney disease Father     kidney stones  . Gout Father   . Anesthesia problems Neg Hx   . Other Neg Hx     History  Substance Use Topics  . Smoking status: Never Smoker   . Smokeless tobacco: Never Used  . Alcohol Use: No    Allergies:  Allergies  Allergen Reactions  . Percocet (Oxycodone-Acetaminophen) Itching    Prescriptions prior to admission  Medication Sig Dispense Refill  . calcium carbonate (TUMS - DOSED IN MG ELEMENTAL CALCIUM) 500 MG chewable tablet Chew 2 tablets by mouth daily as needed. For heartburn      . Camphor-Eucalyptus-Menthol (VICKS VAPORUB EX) Apply 1 application topically  daily as needed. For stuffy nose       . diphenhydramine-acetaminophen (TYLENOL PM) 25-500 MG TABS Take 1 tablet by mouth at bedtime as needed. For headache/sleep      . ferrous sulfate 325 (65 FE) MG tablet Take 325 mg by mouth at bedtime.       . Prenatal Vit-Fe Fumarate-FA (PRENATAL MULTIVITAMIN) TABS Take 1 tablet by mouth at bedtime.         Review of Systems  All other systems reviewed and are negative.   Physical Exam   Blood pressure 135/74, pulse 103, temperature 98.7 F (37.1 C), temperature source Oral, resp. rate 20, height 5' 6.5" (1.689 m), weight 179 lb (81.194 kg), last menstrual period 06/23/2011, SpO2 100.00%.  Physical Exam  Nursing note and vitals reviewed. Constitutional: She is oriented to person, place, and time. She appears well-developed and well-nourished.  HENT:  Head: Normocephalic.  Neck: Normal range of motion.  Cardiovascular: Normal rate, regular rhythm and normal heart sounds.   Respiratory: Effort normal and breath sounds normal.  GI: Soft.  Genitourinary: Vagina normal.       Cervix =2/75/-2  No change after 1hr eval  Musculoskeletal: Normal range of motion. She exhibits edema.  Mild bilateral pedal edema, non-pitting  Neurological: She is alert and oriented to person, place, and time. She has normal reflexes.  Skin: Skin is warm and dry.  Psychiatric: She has a normal mood and affect. Her behavior is normal.   FHR 130, mod variability, 10x10 accels, no decels toco - frequent UI, irreg ctx   MAU Course  Procedures    Assessment and Plan  IUP at [redacted]w[redacted]d  Not in active labor FHR reassuring, but not reactive, will check BPP GBS neg  Pt declines therapeutic rest Await BPP results  Olumide Dolinger M 03/25/2012, 10:52 PM   Addendum: at 1205am  BPP= 8/8  Pt dc'd home in stable condition rv'd [facility] and labor sx's Keep scheduled appt this week

## 2012-03-25 NOTE — Progress Notes (Signed)
Pt states she is therapy right now but is not taking any medication

## 2012-03-26 MED ORDER — HYDROXYZINE PAMOATE 50 MG PO CAPS: 50.0000 mg | ORAL_CAPSULE | Freq: Four times a day (QID) | ORAL | Status: DC | PRN

## 2012-03-27 ENCOUNTER — Ambulatory Visit (INDEPENDENT_AMBULATORY_CARE_PROVIDER_SITE_OTHER): Payer: Medicaid Other | Admitting: Obstetrics and Gynecology

## 2012-03-27 ENCOUNTER — Encounter: Payer: Self-pay | Admitting: Obstetrics and Gynecology

## 2012-03-27 VITALS — Wt 181.0 lb

## 2012-03-27 DIAGNOSIS — Z331 Pregnant state, incidental: Secondary | ICD-10-CM

## 2012-03-27 DIAGNOSIS — Z3490 Encounter for supervision of normal pregnancy, unspecified, unspecified trimester: Secondary | ICD-10-CM

## 2012-03-27 NOTE — Progress Notes (Signed)
Pt wants cx check today  Pt wants induction states tumor presses against bladder causing sharp pain in vagina area. Pt states pain radiates down entire body while voiding. Recent hospital visit due to intense contractions and pelvic pain

## 2012-03-27 NOTE — Progress Notes (Signed)
[redacted]w[redacted]d C/o lower abdominal pain - more sharp pains in vagina SVE to evaluate: Cx: closed ROB: x 1 week No change in vaginal secretions

## 2012-03-30 ENCOUNTER — Inpatient Hospital Stay (HOSPITAL_COMMUNITY): Payer: Medicaid Other

## 2012-03-30 ENCOUNTER — Inpatient Hospital Stay (HOSPITAL_COMMUNITY)
Admission: AD | Admit: 2012-03-30 | Discharge: 2012-03-31 | DRG: 780 | Disposition: A | Discharge status: Home / Self Care | Payer: Medicaid Other | Source: Ambulatory Visit | Attending: Obstetrics and Gynecology | Admitting: Obstetrics and Gynecology

## 2012-03-30 ENCOUNTER — Encounter (HOSPITAL_COMMUNITY): Payer: Self-pay | Admitting: *Deleted

## 2012-03-30 DIAGNOSIS — K429 Umbilical hernia without obstruction or gangrene: Secondary | ICD-10-CM

## 2012-03-30 DIAGNOSIS — O479 False labor, unspecified: Principal | ICD-10-CM | POA: Diagnosis present

## 2012-03-30 DIAGNOSIS — O99340 Other mental disorders complicating pregnancy, unspecified trimester: Secondary | ICD-10-CM

## 2012-03-30 DIAGNOSIS — O4700 False labor before 37 completed weeks of gestation, unspecified trimester: Secondary | ICD-10-CM

## 2012-03-30 DIAGNOSIS — E0590 Thyrotoxicosis, unspecified without thyrotoxic crisis or storm: Secondary | ICD-10-CM

## 2012-03-30 MED ORDER — LACTATED RINGERS IV BOLUS (SEPSIS)
1000.0000 mL | Freq: Once | INTRAVENOUS | Status: AC
  Administered 2012-03-31: 1000 mL via INTRAVENOUS

## 2012-03-30 MED ORDER — BUTORPHANOL TARTRATE 1 MG/ML IJ SOLN
1.0000 mg | Freq: Once | INTRAMUSCULAR | Status: AC
  Administered 2012-03-31: 1 mg via INTRAVENOUS
  Filled 2012-03-30: qty 1

## 2012-03-30 NOTE — MAU Provider Note (Signed)
History     CSN: [id number]  Arrival date and time: 03/30/12 2134   None     Chief Complaint  Patient presents with  . Labor Eval   HPI Comments: Pt is a G1P0 at [redacted]w[redacted]d that arrives unannounced w c/o painful ctx since 9pm. Denies any VB, LOF, GFM. Pt was seen 7-23 for labor eval and per my exam was 2/75-1.  No cervical change noted today. Pt states she's so uncomfortable she does not want to go home. She feels this pain is giving her anxiety, and "i cant take it anymore"  She states ctx have spaced out since arrival, and she has mostly back pain and pelvic pressure. She is inquiring about IOL.      Past Medical History  Diagnosis Date  . Neuromuscular disorder     fibromyalagia  . Back pain   . Gastritis   . Interstitial cystitis   . Endometrial cyst of ovary   . Anxiety     pt states she experiences panic attacks  . GERD (gastroesophageal reflux disease)   . Migraine   . Hernia, hiatal   . Fibroid   . Panic attack   . Panic attacks 01/09/2012  . Fibromyalgia   . Preterm labor   . Urinary tract infection   . Fibromyalgia   . PID (pelvic inflammatory disease)   . Trichomonas   . Depression     seeing a therapist    Past Surgical History  Procedure Date  . Laparoscopy 06/18/2011    Procedure: LAPAROSCOPY DIAGNOSTIC;  Surgeon: Hollie Salk C. Marice Potter, MD;  Location: [facility];  Service: Gynecology;  Laterality: Bilateral;  with bilateral chromopertubation with excision of endometrial biopsy  . Laparoscopic endometriosis fulguration 06/2011    Family History  Problem Relation Age of Onset  . Fibroids Mother   . Hypertension Mother   . Hypertension Father   . Diabetes Father   . Kidney disease Father     kidney stones  . Gout Father   . Anesthesia problems Neg Hx   . Other Neg Hx     History  Substance Use Topics  . Smoking status: Never Smoker   . Smokeless tobacco: Never Used  . Alcohol Use: No    Allergies:  Allergies  Allergen Reactions  . Percocet  (Oxycodone-Acetaminophen) Itching    Prescriptions prior to admission  Medication Sig Dispense Refill  . calcium carbonate (TUMS - DOSED IN MG ELEMENTAL CALCIUM) 500 MG chewable tablet Chew 2 tablets by mouth daily as needed. For heartburn      . diphenhydramine-acetaminophen (TYLENOL PM) 25-500 MG TABS Take 1 tablet by mouth at bedtime as needed. For headache/sleep      . ferrous sulfate 325 (65 FE) MG tablet Take 325 mg by mouth at bedtime.       . Prenatal Vit-Fe Fumarate-FA (PRENATAL MULTIVITAMIN) TABS Take 1 tablet by mouth at bedtime.       . hydrOXYzine (VISTARIL) 50 MG capsule Take 1-2 capsules (50-100 mg total) by mouth every 6 (six) hours as needed for itching.  30 capsule  0    Review of Systems  Eyes: Negative for blurred vision.  Cardiovascular: Negative for chest pain and palpitations.  Gastrointestinal: Positive for abdominal pain. Negative for nausea and vomiting.       Pelvic pain w ctx  Genitourinary: Negative for dysuria and urgency.  Musculoskeletal: Positive for myalgias and back pain.       C/o pain in thighs and lower legs  when walking   Neurological: Negative for headaches.  Psychiatric/Behavioral: Positive for depression. Negative for suicidal ideas. The patient is nervous/anxious and has insomnia.   All other systems reviewed and are negative.   Physical Exam   Blood pressure 117/72, pulse 100, temperature 98.3 F (36.8 C), temperature source Oral, resp. rate 22, last menstrual period 06/23/2011.  Physical Exam  Nursing note and vitals reviewed. Constitutional: She is oriented to person, place, and time. She appears well-developed and well-nourished. She appears distressed.       Pt grimacing and moaning at intervals, winces at times, rubbing abdomen   HENT:  Head: Normocephalic.  Cardiovascular: Normal rate, regular rhythm and normal heart sounds.   Respiratory: Effort normal and breath sounds normal.  GI: Soft. Bowel sounds are normal.    Genitourinary: Vagina normal. No vaginal discharge found.  Musculoskeletal: Normal range of motion. She exhibits edema.       Bilateral LEE, 2+ pitting   Neurological: She is alert and oriented to person, place, and time. She has normal reflexes.  Skin: Skin is warm and dry.  Psychiatric: She has a normal mood and affect. Her behavior is normal.   FHR 130 - initially non-reactive, but overall reassuring no decels, 10x10 accels since returning from [place] toco - frequent UI, rare ctx   MAU Course  Procedures   Assessment and Plan  IUP at [redacted]w[redacted]d GBS neg FHR reassuring  BPP 8/8 - AFI 67%  After long discussion w pt, rv'd options 1. Observation for 1-2 hours and recheck cervix  2. D/c home now 3. IVF's and stadol for rest   Pt agrees to IVF's and stadol, will reeval in 2-3 hours  rv'd w pt normal pregnancy discomforts and BPP results Also discussed do not recommend IOL w unfavorable cervix until 41wks   Candise Crabtree M 03/30/2012, 11:49 PM   Addendum: at 0320  Pt got mild relief w stadol, still very restless Tearful, c/o increasing sx's depression Breathing through ctx  FHR 120 - mod variability, 10x10accels toco - UI continues, w c2-3 min ctx in between   VE unchanged   Again rv'd options

## 2012-03-30 NOTE — MAU Note (Signed)
Pt reports uc's, pain in her legs.

## 2012-03-31 ENCOUNTER — Inpatient Hospital Stay (HOSPITAL_COMMUNITY): Payer: Medicaid Other

## 2012-03-31 ENCOUNTER — Encounter (HOSPITAL_COMMUNITY): Payer: Self-pay | Admitting: *Deleted

## 2012-03-31 DIAGNOSIS — O99340 Other mental disorders complicating pregnancy, unspecified trimester: Secondary | ICD-10-CM

## 2012-03-31 LAB — CBC
HCT: 37.2 % (ref 36.0–46.0)
Hemoglobin: 12.2 g/dL (ref 12.0–15.0)
MCV: 90.3 fL (ref 78.0–100.0)
WBC: 6.2 10*3/uL (ref 4.0–10.5)

## 2012-03-31 LAB — COMPREHENSIVE METABOLIC PANEL
ALT: 16 U/L (ref 0–35)
Alkaline Phosphatase: 254 U/L — ABNORMAL HIGH (ref 39–117)
CO2: 23 mEq/L (ref 19–32)
Chloride: 98 mEq/L (ref 96–112)
GFR calc Af Amer: >90 mL/min (ref >90)
GFR calc non Af Amer: >90 mL/min (ref >90)
Glucose, Bld: 59 mg/dL — ABNORMAL LOW (ref 70–99)
Potassium: 4.5 mEq/L (ref 3.5–5.1)
Sodium: 133 mEq/L — ABNORMAL LOW (ref 135–145)
Total Bilirubin: 0.7 mg/dL (ref 0.3–1.2)

## 2012-03-31 LAB — URINALYSIS, ROUTINE W REFLEX MICROSCOPIC
Leukocytes, UA: NEGATIVE
Specific Gravity, Urine: <1.005 — ABNORMAL LOW (ref 1.005–1.030)

## 2012-03-31 MED ORDER — LIDOCAINE HCL (PF) 1 % IJ SOLN: 30.0000 mL | INTRAMUSCULAR | Status: DC | PRN

## 2012-03-31 MED ORDER — OXYTOCIN 40 UNITS IN LACTATED RINGERS INFUSION - SIMPLE MED: 62.5000 mL/h | Freq: Once | INTRAVENOUS | Status: DC

## 2012-03-31 MED ORDER — CITRIC ACID-SODIUM CITRATE 334-500 MG/5ML PO SOLN
30.0000 mL | ORAL | Status: DC | PRN
  Filled 2012-03-31: qty 15

## 2012-03-31 MED ORDER — LACTATED RINGERS IV SOLN
500.0000 mL | INTRAVENOUS | Status: DC | PRN
Start: 2012-03-31 — End: 2012-03-31
  Administered 2012-03-31: 500 mL via INTRAVENOUS

## 2012-03-31 MED ORDER — CALCIUM CARBONATE ANTACID 500 MG PO CHEW
200.0000 mg | CHEWABLE_TABLET | Freq: Three times a day (TID) | ORAL | Status: DC | PRN
  Administered 2012-03-31: 200 mg via ORAL
  Filled 2012-03-31: qty 1

## 2012-03-31 MED ORDER — LACTATED RINGERS IV SOLN
INTRAVENOUS | Status: DC
  Administered 2012-03-31: 04:00:00 via INTRAVENOUS

## 2012-03-31 MED ORDER — ONDANSETRON HCL 4 MG/2ML IJ SOLN
4.0000 mg | Freq: Once | INTRAMUSCULAR | Status: AC
  Administered 2012-03-31: 4 mg via INTRAVENOUS
  Filled 2012-03-31: qty 2

## 2012-03-31 MED ORDER — OXYTOCIN BOLUS FROM INFUSION
250.0000 mL | Freq: Once | INTRAVENOUS | Status: DC
  Filled 2012-03-31: qty 500

## 2012-03-31 MED ORDER — IBUPROFEN 600 MG PO TABS: 600.0000 mg | ORAL_TABLET | Freq: Four times a day (QID) | ORAL | Status: DC | PRN

## 2012-03-31 MED ORDER — OXYTOCIN 10 UNIT/ML IJ SOLN: 10.0000 [IU] | Freq: Once | INTRAMUSCULAR | Status: DC

## 2012-03-31 MED ORDER — ACETAMINOPHEN 325 MG PO TABS
650.0000 mg | ORAL_TABLET | ORAL | Status: DC | PRN
  Administered 2012-03-31 (×3): 650 mg via ORAL
  Filled 2012-03-31 (×3): qty 2

## 2012-03-31 MED ORDER — ONDANSETRON HCL 4 MG/2ML IJ SOLN: 4.0000 mg | Freq: Four times a day (QID) | INTRAMUSCULAR | Status: DC | PRN

## 2012-03-31 MED ORDER — BUTORPHANOL TARTRATE 1 MG/ML IJ SOLN: 2.0000 mg | INTRAMUSCULAR | Status: DC | PRN

## 2012-03-31 NOTE — H&P (Signed)
Meredith Howard is a 31 y.o. female presenting for ctx., after extended evaluation in MAU, Pt elects to stay for further observation.    Maternal Medical History:  Reason for admission: Reason for admission: contractions.  Contractions: Onset was 6-12 hours ago.   Frequency: regular.   Duration is approximately 60 seconds.   Perceived severity is moderate.    Fetal activity: Perceived fetal activity is normal.   Last perceived fetal movement was within the past hour.    Prenatal complications: no prenatal complications   OB History    Grav Para Term Preterm Abortions TAB SAB Ect Mult Living   1 0 0 0 0 0 0 0 0 0      Past Medical History  Diagnosis Date  . Neuromuscular disorder     fibromyalagia  . Back pain   . Gastritis   . Interstitial cystitis   . Endometrial cyst of ovary   . Anxiety     pt states she experiences panic attacks  . GERD (gastroesophageal reflux disease)   . Migraine   . Hernia, hiatal   . Fibroid   . Panic attack   . Panic attacks 01/09/2012  . Fibromyalgia   . Preterm labor   . Urinary tract infection   . Fibromyalgia   . PID (pelvic inflammatory disease)   . Trichomonas   . Depression     seeing a therapist   Past Surgical History  Procedure Date  . Laparoscopy 06/18/2011    Procedure: LAPAROSCOPY DIAGNOSTIC;  Surgeon: Hollie Salk C. Marice Potter, MD;  Location: [facility];  Service: Gynecology;  Laterality: Bilateral;  with bilateral chromopertubation with excision of endometrial biopsy  . Laparoscopic endometriosis fulguration 06/2011   Family History: family history includes Diabetes in her father; Fibroids in her mother; Gout in her father; Hypertension in her father and mother; and Kidney disease in her father.  There is no history of Anesthesia problems and Other. Social History:  reports that she has never smoked. She has never used smokeless tobacco. She reports that she does not drink alcohol or use illicit drugs.   Prenatal Transfer Tool  Maternal  Diabetes: No Genetic Screening: Normal Maternal Ultrasounds/Referrals: Normal Fetal Ultrasounds or other Referrals:  None Maternal Substance Abuse:  No Significant Maternal Medications:  None Significant Maternal Lab Results:  None Other Comments:  None  Review of Systems  Cardiovascular: Positive for leg swelling.  Musculoskeletal: Positive for myalgias and back pain.  Psychiatric/Behavioral: Positive for depression. Negative for suicidal ideas. The patient is nervous/anxious and has insomnia.   All other systems reviewed and are negative.    Dilation: 1 Effacement (%): 50 Station: -3 Exam by:: B Mosca Blood pressure 116/71, pulse 81, temperature 97.3 F (36.3 C), temperature source Oral, resp. rate 18, last menstrual period 06/23/2011. Maternal Exam:  Uterine Assessment: Contraction strength is moderate.  Contraction duration is 60 seconds. Contraction frequency is irregular.   Abdomen: Patient reports generalized tenderness.  Fundal height is aga.   Estimated fetal weight is 7-8.   Fetal presentation: vertex  Introitus: Normal vulva. Normal vagina.  Pelvis: adequate for delivery.   Cervix: Cervix evaluated by digital exam.     Fetal Exam Fetal Monitor Review: Mode: ultrasound.   Baseline rate: 120.  Variability: moderate (6-25 bpm).   Pattern: no accelerations and no decelerations.    Fetal State Assessment: Category II - tracings are indeterminate.     Physical Exam  Nursing note and vitals reviewed. Constitutional: She is oriented to person,  place, and time. She appears well-developed and well-nourished. She appears distressed.  HENT:  Head: Normocephalic.  Eyes: Pupils are equal, round, and reactive to light.  Neck: Normal range of motion.  Cardiovascular: Normal rate, regular rhythm and normal heart sounds.   Respiratory: Effort normal and breath sounds normal.  GI: Soft. Bowel sounds are normal. There is generalized tenderness.  Genitourinary: Vagina  normal.  Musculoskeletal: Normal range of motion. She exhibits edema. She exhibits no tenderness.  Neurological: She is alert and oriented to person, place, and time. She has normal reflexes.  Skin: Skin is warm and dry.  Psychiatric: She has a normal mood and affect. Her behavior is normal.    Prenatal labs: ABO, Rh: O/Positive/-- (01/03 0000) Antibody: Negative (01/03 0000) Rubella: Immune (01/03 0000) RPR: NON REAC (05/09 0926)  HBsAg: Negative (01/03 0000)  HIV: Non-reactive (01/03 0000)  GBS: NEGATIVE (07/03 1709)   Assessment/Plan: IUP at 105w2d GBS neg Prodrome labor Persistent UI FHR overall reassuring - BPP 8/8 w normal AFI  Admit to b.s. Dr Stefano Gaul attending Routine L&D orders, will add on CMP, and UA  Stadol PRN Epidural when appropriate  Will reevaluate later today    Shoua Ressler M 03/31/2012, 3:54 AM

## 2012-03-31 NOTE — Discharge Summary (Signed)
Physician Discharge Summary  Patient ID: Meredith Howard MRN: [id number] DOB/AGE: 1981/05/14 30 y.o.  Admit date: 03/30/2012 Discharge date: 03/31/2012  Admission Diagnoses:  1.  IUP at [redacted]w[redacted]d  2. 23 hr observation & therapeutic rest  Discharge Diagnoses: 1.  IUP [redacted]w[redacted]d  2. False labor vs prodromal labor  Patient Active Problem List  Diagnosis  . Chronic pelvic pain in female  . Interstitial cystitis  . Dysmenorrhea  . IBS (irritable bowel syndrome)  . Panic attacks  . Depression  . Fibromyalgia  . Migraine headache  . Fibroids  . Preterm uterine contractions, antepartum  . Umbilical hernia  . Hyperthyroidism complicating pregnancy   Discharged Condition: stable  Hospital Course: Pt admitted at [redacted]w[redacted]d for observation of possible labor and therapeutic rest.  Cervix over course of hospitalization without further change.  Pt received IVF for ketonuria and hyponatremia, as well as Stadol IV and tylenol po for various pain complaints.  Pt had a headache on day of d/c, and little relief from previous measures.  Pt also w/ N/v episode mid-morning on 03/31/12, but none thereafter.  Pt received two u/s during stay for BPP and AFI and both WNL w/ 8/8 BPP's x2.  Pt had reactive FHT.  Pt's status and POC was discussed at length at bedside by o/c physician, Dr. Jaymes Graff, MD including overall fetal well-being at post-term, absence of cervical progression, and recommendation with primagravida to delay IOL under these circumstances until 41 weeks.  IOL scheduled before pt's d/c for 41 weeks if no spontaneous labor before then.    Consults: None  Significant Diagnostic Studies: CMET, CBC, u/a, u/s x2 for BPP/AFI  Treatments: IV hydration and analgesia: acetaminophen and Stadol  Discharge Exam: Blood pressure 110/65, pulse 91, temperature 98.1 F (36.7 C), temperature source Oral, resp. rate 20, height 5' 6.5" (1.689 m), weight 179 lb (81.194 kg), last menstrual period 06/23/2011. General  appearance: alert, cooperative, mild distress and flat affect at times and tearful at times  Resp: clear to auscultation bilaterally Cardio: regular rate and rhythm, S1, S2 normal, no murmur, click, rub or gallop GI: soft, non-tender; bowel sounds normal; no masses,  no organomegaly and gravid Pelvic: external genitalia normal and cx:  2/60/-2 Extremities: edema 3+BLE pitting edema and Homans sign is negative, no sign of DVT Skin: Skin color, texture, turgor normal. No rashes or lesions  Disposition: 01-Home or Self Care  Discharge Orders    Future Appointments: Provider: Department: Dept Phone: Center:   04/03/2012 1:30 PM [facility] [phone] None   04/03/2012 2:00 PM Hal Morales, MD [facility] [phone] None     Medication List  As of 03/31/2012  4:54 PM   TAKE these medications         calcium carbonate 500 MG chewable tablet   Commonly known as: TUMS - dosed in mg elemental calcium   Chew 2 tablets by mouth daily as needed. For heartburn      diphenhydramine-acetaminophen 25-500 MG Tabs   Commonly known as: TYLENOL PM   Take 1 tablet by mouth at bedtime as needed. For headache/sleep      ferrous sulfate 325 (65 FE) MG tablet   Take 325 mg by mouth at bedtime.      hydrOXYzine 50 MG capsule   Commonly known as: VISTARIL   Take 1-2 capsules (50-100 mg total) by mouth every 6 (six) hours as needed for itching.      prenatal multivitamin Tabs   Take 1  tablet by mouth at bedtime.           Follow-up Information    Follow up with [facility] on 04/03/2012. (or call as needed with any questions or concerns)          Signed: Adelis Docter H 03/31/2012, 4:54 PM

## 2012-04-01 ENCOUNTER — Telehealth (HOSPITAL_COMMUNITY): Payer: Self-pay | Admitting: *Deleted

## 2012-04-01 NOTE — Telephone Encounter (Signed)
Preadmission screen  

## 2012-04-03 ENCOUNTER — Other Ambulatory Visit: Payer: Medicaid Other

## 2012-04-03 ENCOUNTER — Telehealth: Payer: Self-pay | Admitting: Obstetrics and Gynecology

## 2012-04-03 ENCOUNTER — Encounter: Payer: Self-pay | Admitting: Obstetrics and Gynecology

## 2012-04-03 ENCOUNTER — Ambulatory Visit (INDEPENDENT_AMBULATORY_CARE_PROVIDER_SITE_OTHER): Payer: Medicaid Other | Admitting: Obstetrics and Gynecology

## 2012-04-03 VITALS — BP 118/74

## 2012-04-03 DIAGNOSIS — O480 Post-term pregnancy: Secondary | ICD-10-CM

## 2012-04-03 DIAGNOSIS — N898 Other specified noninflammatory disorders of vagina: Secondary | ICD-10-CM

## 2012-04-03 NOTE — Telephone Encounter (Signed)
Returned pt's VM left for KP.  Female state pt is coming to appt.

## 2012-04-03 NOTE — Progress Notes (Signed)
Pt has increased swelling all over. She has a lot of leg pains, irreg. Contractions and back pain. NST for post dates today.  NST reactive. For induction on 04/05/12

## 2012-04-03 NOTE — Telephone Encounter (Signed)
TC from pt. States wants to cancel NST and ROB due to transportation issues.Explained importance of eval. Irreg contractions. Some FM this AM but has just gotten up. Had small amt watery D/C today. Advised to eat and drink and count FM. Also to do pad test.  To call back at 1:00-1:15 with status.  Also to try to make arrangements for transportation. Pt verbalizes comprehension.

## 2012-04-04 ENCOUNTER — Encounter (HOSPITAL_COMMUNITY): Payer: Self-pay | Admitting: *Deleted

## 2012-04-04 ENCOUNTER — Inpatient Hospital Stay (HOSPITAL_COMMUNITY)
Admission: AD | Admit: 2012-04-04 | Discharge: 2012-04-06 | DRG: 765 | Disposition: A | Discharge status: Other Acute Inpatient Hospital | Payer: Medicaid Other | Source: Ambulatory Visit | Attending: Obstetrics and Gynecology | Admitting: Obstetrics and Gynecology

## 2012-04-04 DIAGNOSIS — E0590 Thyrotoxicosis, unspecified without thyrotoxic crisis or storm: Secondary | ICD-10-CM

## 2012-04-04 DIAGNOSIS — K429 Umbilical hernia without obstruction or gangrene: Secondary | ICD-10-CM

## 2012-04-04 DIAGNOSIS — F329 Major depressive disorder, single episode, unspecified: Secondary | ICD-10-CM | POA: Diagnosis present

## 2012-04-04 DIAGNOSIS — O411090 Infection of amniotic sac and membranes, unspecified, unspecified trimester, not applicable or unspecified: Secondary | ICD-10-CM | POA: Diagnosis present

## 2012-04-04 DIAGNOSIS — Z98891 History of uterine scar from previous surgery: Secondary | ICD-10-CM

## 2012-04-04 DIAGNOSIS — Z331 Pregnant state, incidental: Secondary | ICD-10-CM

## 2012-04-04 DIAGNOSIS — M797 Fibromyalgia: Secondary | ICD-10-CM | POA: Diagnosis present

## 2012-04-04 DIAGNOSIS — F32A Depression, unspecified: Secondary | ICD-10-CM | POA: Diagnosis present

## 2012-04-04 DIAGNOSIS — O4700 False labor before 37 completed weeks of gestation, unspecified trimester: Secondary | ICD-10-CM

## 2012-04-04 MED ORDER — FENTANYL CITRATE 0.05 MG/ML IJ SOLN: 100.0000 ug | INTRAMUSCULAR | Status: DC | PRN

## 2012-04-04 MED ORDER — ACETAMINOPHEN 325 MG PO TABS
650.0000 mg | ORAL_TABLET | ORAL | Status: DC | PRN
  Administered 2012-04-05: 650 mg via ORAL
  Filled 2012-04-04: qty 2

## 2012-04-04 MED ORDER — ONDANSETRON HCL 4 MG/2ML IJ SOLN
4.0000 mg | Freq: Four times a day (QID) | INTRAMUSCULAR | Status: DC | PRN
  Administered 2012-04-05 (×3): 4 mg via INTRAVENOUS
  Filled 2012-04-04 (×4): qty 2

## 2012-04-04 MED ORDER — OXYTOCIN BOLUS FROM INFUSION
250.0000 mL | Freq: Once | INTRAVENOUS | Status: DC
  Filled 2012-04-04: qty 500

## 2012-04-04 MED ORDER — CITRIC ACID-SODIUM CITRATE 334-500 MG/5ML PO SOLN
30.0000 mL | ORAL | Status: DC | PRN
  Administered 2012-04-06: 30 mL via ORAL
  Filled 2012-04-04: qty 15

## 2012-04-04 MED ORDER — OXYTOCIN 40 UNITS IN LACTATED RINGERS INFUSION - SIMPLE MED: 62.5000 mL/h | Freq: Once | INTRAVENOUS | Status: DC

## 2012-04-04 MED ORDER — IBUPROFEN 600 MG PO TABS: 600.0000 mg | ORAL_TABLET | Freq: Four times a day (QID) | ORAL | Status: DC | PRN

## 2012-04-04 MED ORDER — LACTATED RINGERS IV SOLN
500.0000 mL | INTRAVENOUS | Status: DC | PRN
  Administered 2012-04-05 (×2): 500 mL via INTRAVENOUS
  Administered 2012-04-05: 1000 mL via INTRAVENOUS

## 2012-04-04 MED ORDER — OXYTOCIN 10 UNIT/ML IJ SOLN: 10.0000 [IU] | Freq: Once | INTRAMUSCULAR | Status: DC

## 2012-04-04 MED ORDER — LIDOCAINE HCL (PF) 1 % IJ SOLN: 30.0000 mL | INTRAMUSCULAR | Status: DC | PRN

## 2012-04-04 MED ORDER — LACTATED RINGERS IV SOLN
INTRAVENOUS | Status: DC
  Administered 2012-04-04 – 2012-04-05 (×4): via INTRAVENOUS

## 2012-04-04 NOTE — Progress Notes (Signed)
Lillard CNM at bedside discussing with pt plan of care.

## 2012-04-04 NOTE — MAU Note (Signed)
Pt states, " I've had regular contractions for 1.5 hrs, and they are back to back."

## 2012-04-04 NOTE — Progress Notes (Signed)
Called Lillard CNM to notify of pt exam and UC pattern. CNM aware. Orders to monitor for a little while and get reactive tracing then allow pt to walk if she would like.

## 2012-04-05 ENCOUNTER — Inpatient Hospital Stay (HOSPITAL_COMMUNITY): Admit: 2012-04-05 | Payer: Medicaid Other

## 2012-04-05 DIAGNOSIS — Z331 Pregnant state, incidental: Secondary | ICD-10-CM

## 2012-04-05 LAB — RPR: RPR Ser Ql: NONREACTIVE

## 2012-04-05 LAB — CBC
MCH: 29.2 pg (ref 26.0–34.0)
MCV: 89.7 fL (ref 78.0–100.0)
Platelets: 268 10*3/uL (ref 150–400)
RBC: 4.18 MIL/uL (ref 3.87–5.11)
RDW: 13.9 % (ref 11.5–15.5)
WBC: 6.3 10*3/uL (ref 4.0–10.5)

## 2012-04-05 MED ORDER — FENTANYL 2.5 MCG/ML BUPIVACAINE 1/10 % EPIDURAL INFUSION (WH - ANES)
14.0000 mL/h | INTRAMUSCULAR | Status: DC
  Administered 2012-04-05 (×5): 14 mL/h via EPIDURAL
  Filled 2012-04-05 (×6): qty 60

## 2012-04-05 MED ORDER — EPHEDRINE 5 MG/ML INJ
10.0000 mg | INTRAVENOUS | Status: DC | PRN
  Filled 2012-04-05: qty 4

## 2012-04-05 MED ORDER — LACTATED RINGERS IV BOLUS (SEPSIS): 500.0000 mL | Freq: Once | INTRAVENOUS | Status: DC

## 2012-04-05 MED ORDER — OXYTOCIN 40 UNITS IN LACTATED RINGERS INFUSION - SIMPLE MED: 1.0000 m[IU]/min | INTRAVENOUS | Status: DC

## 2012-04-05 MED ORDER — ONDANSETRON HCL 4 MG/2ML IJ SOLN
4.0000 mg | INTRAMUSCULAR | Status: DC | PRN
  Administered 2012-04-06: 4 mg via INTRAVENOUS

## 2012-04-05 MED ORDER — PHENYLEPHRINE 40 MCG/ML (10ML) SYRINGE FOR IV PUSH (FOR BLOOD PRESSURE SUPPORT)
80.0000 ug | PREFILLED_SYRINGE | INTRAVENOUS | Status: DC | PRN
  Filled 2012-04-05: qty 5

## 2012-04-05 MED ORDER — PANTOPRAZOLE SODIUM 40 MG IV SOLR
40.0000 mg | INTRAVENOUS | Status: DC
  Administered 2012-04-05: 40 mg via INTRAVENOUS
  Filled 2012-04-05 (×2): qty 40

## 2012-04-05 MED ORDER — OXYTOCIN 40 UNITS IN LACTATED RINGERS INFUSION - SIMPLE MED
1.0000 m[IU]/min | INTRAVENOUS | Status: DC
  Administered 2012-04-05: 2 m[IU]/min via INTRAVENOUS
  Filled 2012-04-05: qty 1000

## 2012-04-05 MED ORDER — TERBUTALINE SULFATE 1 MG/ML IJ SOLN: 0.2500 mg | Freq: Once | INTRAMUSCULAR | Status: AC | PRN

## 2012-04-05 MED ORDER — CALCIUM CARBONATE ANTACID 500 MG PO CHEW
400.0000 mg | CHEWABLE_TABLET | ORAL | Status: DC | PRN
  Administered 2012-04-05: 400 mg via ORAL
  Filled 2012-04-05: qty 1
  Filled 2012-04-05: qty 2

## 2012-04-05 MED ORDER — PHENYLEPHRINE 40 MCG/ML (10ML) SYRINGE FOR IV PUSH (FOR BLOOD PRESSURE SUPPORT): 80.0000 ug | PREFILLED_SYRINGE | INTRAVENOUS | Status: DC | PRN

## 2012-04-05 MED ORDER — PANTOPRAZOLE SODIUM 40 MG IV SOLR
40.0000 mg | INTRAVENOUS | Status: DC
  Administered 2012-04-05: 40 mg via INTRAVENOUS
  Filled 2012-04-05: qty 40

## 2012-04-05 MED ORDER — SODIUM BICARBONATE 8.4 % IV SOLN
INTRAVENOUS | Status: DC | PRN
  Administered 2012-04-05: 4 mL via EPIDURAL

## 2012-04-05 MED ORDER — EPHEDRINE 5 MG/ML INJ: 10.0000 mg | INTRAVENOUS | Status: DC | PRN

## 2012-04-05 MED ORDER — DIPHENHYDRAMINE HCL 50 MG/ML IJ SOLN: 12.5000 mg | INTRAMUSCULAR | Status: DC | PRN

## 2012-04-05 MED ORDER — LACTATED RINGERS IV SOLN
500.0000 mL | Freq: Once | INTRAVENOUS | Status: AC
  Administered 2012-04-05: 500 mL via INTRAVENOUS

## 2012-04-05 MED ORDER — FENTANYL 2.5 MCG/ML BUPIVACAINE 1/10 % EPIDURAL INFUSION (WH - ANES)
INTRAMUSCULAR | Status: DC | PRN
  Administered 2012-04-05: 14 mL/h via EPIDURAL

## 2012-04-05 MED ORDER — SODIUM CHLORIDE 0.9 % IV SOLN
3.0000 g | Freq: Four times a day (QID) | INTRAVENOUS | Status: DC
  Administered 2012-04-05: 3 g via INTRAVENOUS
  Filled 2012-04-05 (×4): qty 3

## 2012-04-05 NOTE — Progress Notes (Signed)
Updated CNM of interventions used, and pulled up tracing for CNM. Orders to continue to monitor

## 2012-04-05 NOTE — Progress Notes (Signed)
Patient ID: ANIE JUNIEL, female   DOB: February 21, 1981, 31 y.o.   MRN: [id number] .Subjective: Remains comfortable w epidural    Objective: BP 119/69  Pulse 90  Temp 98 F (36.7 C) (Oral)  Resp 20  Ht 5\' 6"  (1.676 m)  Wt 185 lb (83.915 kg)  BMI 29.86 kg/m2  LMP 06/23/2011   FHT:  FHR: 140 bpm, variability: minimal ,  accelerations:  Present,  decelerations:  Present prolonged, most recent late onset  UC:   irregular, every 2-5 minutes SVE:   Dilation: 3.5 Effacement (%): 80 Station: -2 Exam by:: [email or web address] ([place])  Pitocin now off, was on 4mu   Assessment / Plan: early labor GBS neg FHR tracing improved w position changes, IVF bolus and 02  Will allow for recovery and restart pitocin when appropriate    Fetal Wellbeing:  Category II Pain Control:  Epidural  Update physician PRN  Malekai Markwood M 04/05/2012, 8:55 AM

## 2012-04-05 NOTE — Progress Notes (Signed)
T 102 IV fluid bolus, fever, unasyn 3 IPV q 6 hours, collaboration with Dr. Su Hilt per telephone. Lavera Guise, CNM

## 2012-04-05 NOTE — Progress Notes (Signed)
Dr. Su Hilt updated per telephone as documented prior note. Continue care. Lavera Guise, CNM

## 2012-04-05 NOTE — Anesthesia Procedure Notes (Signed)

## 2012-04-05 NOTE — Progress Notes (Signed)
Lillard notified of decels and interventions used.

## 2012-04-05 NOTE — H&P (Signed)
Meredith Howard is a 31 y.o. female presenting for labor eval. Denies any VB, LOF, reports GFM.   HPI:   Maternal Medical History:  Reason for admission: Reason for admission: contractions.    OB History    Grav Para Term Preterm Abortions TAB SAB Ect Mult Living   1 0 0 0 0 0 0 0 0 0      Past Medical History  Diagnosis Date  . Neuromuscular disorder     fibromyalagia  . Back pain   . Gastritis   . Interstitial cystitis   . Endometrial cyst of ovary   . Anxiety     pt states she experiences panic attacks  . GERD (gastroesophageal reflux disease)   . Migraine   . Hernia, hiatal   . Fibroid   . Panic attack   . Panic attacks 01/09/2012  . Fibromyalgia   . Preterm labor   . Urinary tract infection   . Fibromyalgia   . PID (pelvic inflammatory disease)   . Trichomonas   . Depression     seeing a therapist   Past Surgical History  Procedure Date  . Laparoscopy 06/18/2011    Procedure: LAPAROSCOPY DIAGNOSTIC;  Surgeon: Hollie Salk C. Marice Potter, MD;  Location: [facility];  Service: Gynecology;  Laterality: Bilateral;  with bilateral chromopertubation with excision of endometrial biopsy  . Laparoscopic endometriosis fulguration 06/2011   Family History: family history includes Diabetes in her father; Fibroids in her mother; Gout in her father; Hypertension in her father and mother; and Kidney disease in her father.  There is no history of Anesthesia problems and Other. Social History:  reports that she has never smoked. She has never used smokeless tobacco. She reports that she does not drink alcohol or use illicit drugs.   Prenatal Transfer Tool  Maternal Diabetes: No Genetic Screening: Normal Maternal Ultrasounds/Referrals: Normal Fetal Ultrasounds or other Referrals:  None Maternal Substance Abuse:  No Significant Maternal Medications:  None Significant Maternal Lab Results:  None Other Comments:  None  ROS  Dilation: 3.5 Effacement (%): 80 Station: -2 Exam by::  (CNM coming  to do sve for presentation) Blood pressure 120/64, pulse 104, temperature 97.9 F (36.6 C), temperature source Oral, resp. rate 18, height 5\' 6"  (1.676 m), weight 185 lb (83.915 kg), last menstrual period 06/23/2011. Exam Physical Exam  Prenatal labs: ABO, Rh: O/Positive/-- (01/03 0000) Antibody: Negative (01/03 0000) Rubella: Immune (01/03 0000) RPR: NON REACTIVE (08/02 2350)  HBsAg: Negative (01/03 0000)  HIV: Non-reactive (01/03 0000)  GBS: NEGATIVE (07/03 1709)   Assessment/Plan: IUP at [redacted]w[redacted]d Prodrome labor, was observed overnight on L&D on 7-28,  FHR reassuring GBS neg Pt sched for IOL 8-3 in the PM  Admit to b.s per c/w dr Su Hilt Routine L&D orders Pt declines IV meds Plans epidural Will start pitocin augmentation   LILLARD,SHELLEY M 04/05/2012, 1:20 PM

## 2012-04-05 NOTE — Progress Notes (Signed)
  Subjective: Comfortable with epidural.  Pitocin never turned on from earlier, due to no VE.  Objective: BP 109/62  Pulse 102  Temp 97.9 F (36.6 C) (Oral)  Resp 18  Ht 5\' 6"  (1.676 m)  Wt 185 lb (83.915 kg)  BMI 29.86 kg/m2  LMP 06/23/2011   Total I/O In: -  Out: 150 [Emesis/NG output:150]  FHT: Reassuring UC:   irregular, every 4-6 minutes SVE:   Dilation: 4.5 Effacement (%): 80 Station: -2 Exam by:: VLatham,CNM IUPC and FSE placed  Labs: Lab Results  Component Value Date   WBC 6.3 04/04/2012   HGB 12.2 04/04/2012   HCT 37.5 04/04/2012   MCV 89.7 04/04/2012   PLT 268 04/04/2012    Assessment / Plan: Inadequate labor Will re-start pitocin and observe.   Meredith Howard 04/05/2012, 2:06 PM

## 2012-04-05 NOTE — Anesthesia Preprocedure Evaluation (Addendum)
Anesthesia Evaluation  Patient identified by MRN, date of birth, ID band Patient awake    Reviewed: Allergy & Precautions, H&P , Patient's Chart, lab work & pertinent test results  Airway Mallampati: II TM Distance: >3 FB Neck ROM: full    Dental  (+) Teeth Intact   Pulmonary  breath sounds clear to auscultation        Cardiovascular Rhythm:regular Rate:Normal     Neuro/Psych  Headaches (migraines), PSYCHIATRIC DISORDERS (panic attacks) Anxiety Depression Back pain    GI/Hepatic hiatal hernia, GERD-  ,IBS   Endo/Other  Hyperthyroidism   Renal/GU  Bladder dysfunction (interstitial cystitis)      Musculoskeletal  (+) Fibromyalgia -  Abdominal   Peds  Hematology   Anesthesia Other Findings       Reproductive/Obstetrics (+) Pregnancy                          Anesthesia Physical Anesthesia Plan  ASA: II and Emergent  Anesthesia Plan: Epidural   Post-op Pain Management:    Induction:   Airway Management Planned:   Additional Equipment:   Intra-op Plan:   Post-operative Plan:   Informed Consent: I have reviewed the patients History and Physical, chart, labs and discussed the procedure including the risks, benefits and alternatives for the proposed anesthesia with the patient or authorized representative who has indicated his/her understanding and acceptance.   Dental Advisory Given  Plan Discussed with:   Anesthesia Plan Comments: (Labs checked- platelets confirmed with RN in room. Fetal heart tracing, per RN, reported to be stable enough for sitting procedure. Discussed epidural, and patient consents to the procedure:  included risk of possible headache,backache, failed block, allergic reaction, and nerve injury. This patient was asked if she had any questions or concerns before the procedure started. )       Anesthesia Quick Evaluation

## 2012-04-05 NOTE — Progress Notes (Signed)
  Subjective: Comfortable with epidural.  Objective: BP 118/70  Pulse 83  Temp 98 F (36.7 C) (Oral)  Resp 18  Ht 5\' 6"  (1.676 m)  Wt 185 lb (83.915 kg)  BMI 29.86 kg/m2  LMP 06/23/2011      FHT: Reassuring, with diminished variability at present, on O2. UC:   Irregular, mild, since pitocin d/c'd around 6:15am SVE:  3.5, 80%, vtx, -1, leaking clear fluid.  Labs: Lab Results  Component Value Date   WBC 6.3 04/04/2012   HGB 12.2 04/04/2012   HCT 37.5 04/04/2012   MCV 89.7 04/04/2012   PLT 268 04/04/2012    Assessment / Plan: Early labor  Plan: Restart pitocin per low dose protocol. Close observation of FHR status.   Nigel Bridgeman 04/05/2012, 9:35 AM

## 2012-04-05 NOTE — Plan of Care (Signed)
Problem: Consults Goal: Birthing Suites Patient Information Press F2 to bring up selections list  Outcome: Completed/Met Date Met:  04/05/12  Pt > [redacted] weeks EGA

## 2012-04-05 NOTE — Progress Notes (Signed)
Patient ID: Meredith Howard, female   DOB: August 10, 1981, 31 y.o.   MRN: [id number] .Subjective:  Sitting HF, c/o nausea and reflux, otherwise comfortable,   Objective: BP 114/74  Pulse 84  Temp 97.9 F (36.6 C) (Oral)  Resp 16  Ht 5\' 6"  (1.676 m)  Wt 185 lb (83.915 kg)  BMI 29.86 kg/m2  LMP 06/23/2011   FHT:  FHR: 130 bpm, variability: moderate,  accelerations:  Present,  decelerations:  Absent UC:   irregular, every 2-5 minutes SVE:   Dilation: 2.5 Effacement (%): 60 Station: -2 Exam by:: Chief Technology Officer  VE=3/0/-2 AROM for clear fluid Pitocin at 4mu  Assessment / Plan: induction, progressing, early labor GBS neg Will give protonix IV, has rcvd zofran Continue pitocin titration   Fetal Wellbeing:  Category I Pain Control:  Epidural  Update physician PRN  Brigit Doke M 04/05/2012, 4:30 AM

## 2012-04-05 NOTE — Progress Notes (Signed)
  Subjective: Feeling pressure in pelvis and rectum.  Struggling with reflux and burping, no true nausea at present.  Objective: BP 110/66  Pulse 94  Temp 98 F (36.7 C) (Axillary)  Resp 18  Ht 5\' 6"  (1.676 m)  Wt 185 lb (83.915 kg)  BMI 29.86 kg/m2  LMP 06/23/2011 I/O last 3 completed shifts: In: -  Out: 150 [Emesis/NG output:150]    FHT: Category 1 UC:  q 2-2 1/2 min, but mild, MVUs <150 Pitocin on 8 mu/min SVE:   Dilation: 5 Effacement (%): 80 Station: -2 Exam by:: V Caiden Monsivais CNM Small amount cervical change--vtx still at -2, not well-applied. Foley draining dark urine.  Assessment / Plan: Slow progress of labor Reflux Will give Protonix. Continue to augment to adequacy.     Nigel Bridgeman 04/05/2012, 7:32 PM

## 2012-04-05 NOTE — Progress Notes (Signed)
  Subjective: Comfortable with epidural.  Feeling some pressure, leaking clear fluid.  Objective: BP 111/76  Pulse 101  Temp 98.4 F (36.9 C) (Oral)  Resp 18  Ht 5\' 6"  (1.676 m)  Wt 185 lb (83.915 kg)  BMI 29.86 kg/m2  LMP 06/23/2011   Total I/O In: -  Out: 150 [Emesis/NG output:150]  FHT:  Category 1, with negative CST UC:   irregular, every 2-6 minutes SVE:   Dilation: 4.5 Effacement (%): 80 Station: -2 Exam by:: Viki Carrera, cnm No change from previous exam.  Labs: Lab Results  Component Value Date   WBC 6.3 04/04/2012   HGB 12.2 04/04/2012   HCT 37.5 04/04/2012   MCV 89.7 04/04/2012   PLT 268 04/04/2012    Assessment / Plan: Inadequate labor Will continue pitocin augmentation. Reviewed with patient the goal of adequate labor, while monitoring maternal/fetal status.  Patient hopes for vaginal delivery.   Nigel Bridgeman 04/05/2012, 4:21 PM

## 2012-04-05 NOTE — Progress Notes (Signed)
Comfortable with exception of pressure pain had refused additional epidural meds due to shivering O VSS      fhts category 1      abd soft between uc      Contractions adequate past hour      Vag my initial exam 4-5 90 -1/-2 A  Not in active labor     Adequate uc x 1 hour P continue Pitocin, continue care Lavera Guise, CNM

## 2012-04-05 NOTE — Progress Notes (Signed)
Per Leeann Must- check pt cervix, D/C pitocin and update CNM

## 2012-04-06 ENCOUNTER — Encounter (HOSPITAL_COMMUNITY): Payer: Self-pay | Admitting: Obstetrics

## 2012-04-06 ENCOUNTER — Inpatient Hospital Stay (HOSPITAL_COMMUNITY): Payer: Medicaid Other | Admitting: Anesthesiology

## 2012-04-06 ENCOUNTER — Encounter (HOSPITAL_COMMUNITY)
Admission: AD | Disposition: A | Discharge status: Other Acute Inpatient Hospital | Payer: Self-pay | Source: Ambulatory Visit | Attending: Obstetrics and Gynecology

## 2012-04-06 ENCOUNTER — Encounter (HOSPITAL_COMMUNITY): Payer: Self-pay | Admitting: Anesthesiology

## 2012-04-06 DIAGNOSIS — Z98891 History of uterine scar from previous surgery: Secondary | ICD-10-CM | POA: Diagnosis not present

## 2012-04-06 DIAGNOSIS — O411090 Infection of amniotic sac and membranes, unspecified, unspecified trimester, not applicable or unspecified: Secondary | ICD-10-CM

## 2012-04-06 DIAGNOSIS — Z331 Pregnant state, incidental: Secondary | ICD-10-CM

## 2012-04-06 LAB — CBC
Hemoglobin: 10.2 g/dL — ABNORMAL LOW (ref 12.0–15.0)
MCH: 30.4 pg (ref 26.0–34.0)
MCHC: 33.7 g/dL (ref 30.0–36.0)

## 2012-04-06 SURGERY — Surgical Case
Anesthesia: Epidural | Site: Abdomen | Wound class: Clean Contaminated

## 2012-04-06 MED ORDER — LACTATED RINGERS IV SOLN
INTRAVENOUS | Status: DC
  Administered 2012-04-06: 12:00:00 via INTRAVENOUS

## 2012-04-06 MED ORDER — PRENATAL MULTIVITAMIN CH: 1.0000 | ORAL_TABLET | Freq: Every day | ORAL | Status: DC

## 2012-04-06 MED ORDER — PANTOPRAZOLE SODIUM 40 MG IV SOLR
40.0000 mg | INTRAVENOUS | Status: DC
  Administered 2012-04-06: 40 mg via INTRAVENOUS
  Filled 2012-04-06 (×2): qty 40

## 2012-04-06 MED ORDER — FENTANYL CITRATE 0.05 MG/ML IJ SOLN
INTRAMUSCULAR | Status: AC
  Administered 2012-04-06: 50 ug via INTRAVENOUS
  Filled 2012-04-06: qty 2

## 2012-04-06 MED ORDER — ONDANSETRON HCL 4 MG PO TABS: 4.0000 mg | ORAL_TABLET | ORAL | Status: DC | PRN

## 2012-04-06 MED ORDER — DIPHENHYDRAMINE HCL 50 MG/ML IJ SOLN: 25.0000 mg | INTRAMUSCULAR | Status: DC | PRN

## 2012-04-06 MED ORDER — SODIUM CHLORIDE 0.9 % IV SOLN
3.0000 g | Freq: Four times a day (QID) | INTRAVENOUS | Status: DC
  Administered 2012-04-06 (×3): 3 g via INTRAVENOUS
  Filled 2012-04-06 (×4): qty 3

## 2012-04-06 MED ORDER — DIPHENHYDRAMINE HCL 50 MG/ML IJ SOLN: 12.5000 mg | INTRAMUSCULAR | Status: DC | PRN

## 2012-04-06 MED ORDER — MEPERIDINE HCL 25 MG/ML IJ SOLN: 6.2500 mg | INTRAMUSCULAR | Status: DC | PRN

## 2012-04-06 MED ORDER — SENNOSIDES-DOCUSATE SODIUM 8.6-50 MG PO TABS: 2.0000 | ORAL_TABLET | Freq: Every day | ORAL | Status: DC

## 2012-04-06 MED ORDER — FENTANYL CITRATE 0.05 MG/ML IJ SOLN
25.0000 ug | INTRAMUSCULAR | Status: DC | PRN
  Administered 2012-04-06 (×2): 50 ug via INTRAVENOUS

## 2012-04-06 MED ORDER — KETOROLAC TROMETHAMINE 30 MG/ML IJ SOLN
30.0000 mg | Freq: Four times a day (QID) | INTRAMUSCULAR | Status: DC | PRN
  Administered 2012-04-06 (×2): 30 mg via INTRAVENOUS
  Filled 2012-04-06 (×2): qty 1

## 2012-04-06 MED ORDER — ONDANSETRON HCL 4 MG/2ML IJ SOLN
INTRAMUSCULAR | Status: AC
  Filled 2012-04-06: qty 2

## 2012-04-06 MED ORDER — SODIUM BICARBONATE 8.4 % IV SOLN
INTRAVENOUS | Status: AC
  Filled 2012-04-06: qty 50

## 2012-04-06 MED ORDER — MEPERIDINE HCL 25 MG/ML IJ SOLN
INTRAMUSCULAR | Status: AC
  Filled 2012-04-06: qty 1

## 2012-04-06 MED ORDER — MENTHOL 3 MG MT LOZG: 1.0000 | LOZENGE | OROMUCOSAL | Status: DC | PRN

## 2012-04-06 MED ORDER — SODIUM CHLORIDE 0.9 % IJ SOLN: 3.0000 mL | INTRAMUSCULAR | Status: DC | PRN

## 2012-04-06 MED ORDER — IBUPROFEN 600 MG PO TABS: 600.0000 mg | ORAL_TABLET | Freq: Four times a day (QID) | ORAL | Status: DC | PRN

## 2012-04-06 MED ORDER — NALBUPHINE HCL 10 MG/ML IJ SOLN
5.0000 mg | INTRAMUSCULAR | Status: DC | PRN
  Filled 2012-04-06: qty 1

## 2012-04-06 MED ORDER — HYDROCODONE-ACETAMINOPHEN 5-325 MG PO TABS: 1.0000 | ORAL_TABLET | ORAL | Status: DC | PRN

## 2012-04-06 MED ORDER — OXYTOCIN 40 UNITS IN LACTATED RINGERS INFUSION - SIMPLE MED: 62.5000 mL/h | INTRAVENOUS | Status: AC

## 2012-04-06 MED ORDER — MORPHINE SULFATE (PF) 0.5 MG/ML IJ SOLN
INTRAMUSCULAR | Status: DC | PRN
  Administered 2012-04-06: .5 mg via INTRAVENOUS
  Administered 2012-04-06 (×2): .05 mg via EPIDURAL

## 2012-04-06 MED ORDER — IBUPROFEN 600 MG PO TABS: 600.0000 mg | ORAL_TABLET | Freq: Four times a day (QID) | ORAL | Status: DC

## 2012-04-06 MED ORDER — OXYCODONE-ACETAMINOPHEN 5-325 MG PO TABS: 1.0000 | ORAL_TABLET | ORAL | Status: DC | PRN

## 2012-04-06 MED ORDER — SIMETHICONE 80 MG PO CHEW: 80.0000 mg | CHEWABLE_TABLET | ORAL | Status: DC | PRN

## 2012-04-06 MED ORDER — LIDOCAINE-EPINEPHRINE (PF) 2 %-1:200000 IJ SOLN
INTRAMUSCULAR | Status: AC
  Filled 2012-04-06: qty 20

## 2012-04-06 MED ORDER — PHENYLEPHRINE HCL 10 MG/ML IJ SOLN
INTRAMUSCULAR | Status: DC | PRN
  Administered 2012-04-05: 40 ug via INTRAVENOUS
  Administered 2012-04-06 (×4): 80 ug via INTRAVENOUS
  Administered 2012-04-06: 20 ug via INTRAVENOUS
  Administered 2012-04-06: 80 ug via INTRAVENOUS
  Administered 2012-04-06: 40 ug via INTRAVENOUS
  Administered 2012-04-06: 20 ug via INTRAVENOUS
  Administered 2012-04-06: 80 ug via INTRAVENOUS

## 2012-04-06 MED ORDER — ONDANSETRON HCL 4 MG/2ML IJ SOLN
4.0000 mg | Freq: Three times a day (TID) | INTRAMUSCULAR | Status: DC | PRN
  Filled 2012-04-06: qty 2

## 2012-04-06 MED ORDER — NALOXONE HCL 0.4 MG/ML IJ SOLN: 0.4000 mg | INTRAMUSCULAR | Status: DC | PRN

## 2012-04-06 MED ORDER — WITCH HAZEL-GLYCERIN EX PADS: 1.0000 "application " | MEDICATED_PAD | CUTANEOUS | Status: DC | PRN

## 2012-04-06 MED ORDER — TETANUS-DIPHTH-ACELL PERTUSSIS 5-2.5-18.5 LF-MCG/0.5 IM SUSP: 0.5000 mL | Freq: Once | INTRAMUSCULAR | Status: DC

## 2012-04-06 MED ORDER — SIMETHICONE 80 MG PO CHEW: 80.0000 mg | CHEWABLE_TABLET | Freq: Three times a day (TID) | ORAL | Status: DC

## 2012-04-06 MED ORDER — DIBUCAINE 1 % RE OINT: 1.0000 "application " | TOPICAL_OINTMENT | RECTAL | Status: DC | PRN

## 2012-04-06 MED ORDER — METOCLOPRAMIDE HCL 5 MG/ML IJ SOLN
10.0000 mg | Freq: Three times a day (TID) | INTRAMUSCULAR | Status: DC | PRN
  Administered 2012-04-06: 10 mg via INTRAVENOUS
  Filled 2012-04-06: qty 2

## 2012-04-06 MED ORDER — CEFAZOLIN SODIUM 1-5 GM-% IV SOLN
INTRAVENOUS | Status: DC | PRN
  Administered 2012-04-05: 2 g via INTRAVENOUS

## 2012-04-06 MED ORDER — SCOPOLAMINE 1 MG/3DAYS TD PT72
1.0000 | MEDICATED_PATCH | Freq: Once | TRANSDERMAL | Status: DC
  Administered 2012-04-06: 1.5 mg via TRANSDERMAL
  Filled 2012-04-06 (×2): qty 1

## 2012-04-06 MED ORDER — KETOROLAC TROMETHAMINE 30 MG/ML IJ SOLN: 30.0000 mg | Freq: Four times a day (QID) | INTRAMUSCULAR | Status: DC | PRN

## 2012-04-06 MED ORDER — MORPHINE SULFATE (PF) 0.5 MG/ML IJ SOLN
INTRAMUSCULAR | Status: DC | PRN
  Administered 2012-04-06: 4 mg via EPIDURAL

## 2012-04-06 MED ORDER — 0.9 % SODIUM CHLORIDE (POUR BTL) OPTIME
TOPICAL | Status: DC | PRN
  Administered 2012-04-06: 900 mL

## 2012-04-06 MED ORDER — ONDANSETRON HCL 4 MG/2ML IJ SOLN
INTRAMUSCULAR | Status: DC | PRN
  Administered 2012-04-06: 4 mg via INTRAVENOUS

## 2012-04-06 MED ORDER — KETOROLAC TROMETHAMINE 60 MG/2ML IM SOLN
60.0000 mg | Freq: Once | INTRAMUSCULAR | Status: DC | PRN
  Filled 2012-04-06: qty 2

## 2012-04-06 MED ORDER — ZOLPIDEM TARTRATE 5 MG PO TABS: 5.0000 mg | ORAL_TABLET | Freq: Every evening | ORAL | Status: DC | PRN

## 2012-04-06 MED ORDER — LANOLIN HYDROUS EX OINT: 1.0000 "application " | TOPICAL_OINTMENT | CUTANEOUS | Status: DC | PRN

## 2012-04-06 MED ORDER — MEPERIDINE HCL 25 MG/ML IJ SOLN
INTRAMUSCULAR | Status: DC | PRN
  Administered 2012-04-06 (×2): 12.5 mg via INTRAVENOUS

## 2012-04-06 MED ORDER — ONDANSETRON HCL 4 MG/2ML IJ SOLN
4.0000 mg | INTRAMUSCULAR | Status: DC | PRN
  Administered 2012-04-06: 4 mg via INTRAVENOUS
  Filled 2012-04-06: qty 2

## 2012-04-06 MED ORDER — OXYTOCIN 10 UNIT/ML IJ SOLN
40.0000 [IU] | INTRAVENOUS | Status: DC | PRN
  Administered 2012-04-06: 40 [IU] via INTRAVENOUS

## 2012-04-06 MED ORDER — MORPHINE SULFATE 0.5 MG/ML IJ SOLN
INTRAMUSCULAR | Status: AC
  Filled 2012-04-06: qty 10

## 2012-04-06 MED ORDER — OXYTOCIN 10 UNIT/ML IJ SOLN
INTRAMUSCULAR | Status: AC
  Filled 2012-04-06: qty 4

## 2012-04-06 MED ORDER — LACTATED RINGERS IV SOLN
INTRAVENOUS | Status: DC | PRN
  Administered 2012-04-06 (×3): via INTRAVENOUS

## 2012-04-06 MED ORDER — KETOROLAC TROMETHAMINE 60 MG/2ML IM SOLN
INTRAMUSCULAR | Status: AC
  Administered 2012-04-06: 60 mg via INTRAMUSCULAR
  Filled 2012-04-06: qty 2

## 2012-04-06 MED ORDER — CEFAZOLIN SODIUM-DEXTROSE 2-3 GM-% IV SOLR
INTRAVENOUS | Status: AC
  Filled 2012-04-06: qty 50

## 2012-04-06 MED ORDER — SODIUM CHLORIDE 0.9 % IV SOLN
1.0000 ug/kg/h | INTRAVENOUS | Status: DC | PRN
  Filled 2012-04-06: qty 2.5

## 2012-04-06 MED ORDER — DIPHENHYDRAMINE HCL 25 MG PO CAPS
25.0000 mg | ORAL_CAPSULE | ORAL | Status: DC | PRN
  Filled 2012-04-06: qty 1

## 2012-04-06 MED ORDER — DIPHENHYDRAMINE HCL 25 MG PO CAPS: 25.0000 mg | ORAL_CAPSULE | Freq: Four times a day (QID) | ORAL | Status: DC | PRN

## 2012-04-06 MED ORDER — PHENYLEPHRINE 40 MCG/ML (10ML) SYRINGE FOR IV PUSH (FOR BLOOD PRESSURE SUPPORT)
PREFILLED_SYRINGE | INTRAVENOUS | Status: AC
  Filled 2012-04-06: qty 20

## 2012-04-06 SURGICAL SUPPLY — 34 items
BENZOIN TINCTURE PRP APPL 2/3 (GAUZE/BANDAGES/DRESSINGS) ×2 IMPLANT
CHLORAPREP W/TINT 26ML (MISCELLANEOUS) ×2 IMPLANT
CLOTH BEACON ORANGE TIMEOUT ST (SAFETY) ×2 IMPLANT
CONTAINER PREFILL 10% NBF 15ML (MISCELLANEOUS) IMPLANT
DRESSING TELFA 8X3 (GAUZE/BANDAGES/DRESSINGS) ×2 IMPLANT
ELECT REM PT RETURN 9FT ADLT (ELECTROSURGICAL) ×2
ELECTRODE REM PT RTRN 9FT ADLT (ELECTROSURGICAL) ×1 IMPLANT
EXTRACTOR VACUUM M CUP 4 TUBE (SUCTIONS) IMPLANT
GAUZE SPONGE 4X4 12PLY STRL LF (GAUZE/BANDAGES/DRESSINGS) ×2 IMPLANT
GLOVE BIO SURGEON STRL SZ7.5 (GLOVE) ×4 IMPLANT
GLOVE BIOGEL PI IND STRL 7.5 (GLOVE) ×2 IMPLANT
GLOVE BIOGEL PI INDICATOR 7.5 (GLOVE) ×2
GOWN PREVENTION PLUS LG XLONG (DISPOSABLE) ×6 IMPLANT
KIT ABG SYR 3ML LUER SLIP (SYRINGE) ×4 IMPLANT
NEEDLE HYPO 22GX1.5 SAFETY (NEEDLE) IMPLANT
NEEDLE HYPO 25X5/8 SAFETYGLIDE (NEEDLE) ×4 IMPLANT
NS IRRIG 1000ML POUR BTL (IV SOLUTION) ×2 IMPLANT
PACK C SECTION WH (CUSTOM PROCEDURE TRAY) ×2 IMPLANT
PAD ABD 7.5X8 STRL (GAUZE/BANDAGES/DRESSINGS) ×2 IMPLANT
RETRACTOR WND ALEXIS 25 LRG (MISCELLANEOUS) ×1 IMPLANT
RTRCTR WOUND ALEXIS 25CM LRG (MISCELLANEOUS) ×2
SLEEVE SCD COMPRESS KNEE MED (MISCELLANEOUS) ×2 IMPLANT
STRIP CLOSURE SKIN 1/2X4 (GAUZE/BANDAGES/DRESSINGS) ×2 IMPLANT
SUT CHROMIC 2 0 CT 1 (SUTURE) ×2 IMPLANT
SUT MNCRL AB 3-0 PS2 27 (SUTURE) ×2 IMPLANT
SUT PLAIN 0 NONE (SUTURE) IMPLANT
SUT PLAIN 2 0 XLH (SUTURE) ×2 IMPLANT
SUT VIC AB 0 CT1 36 (SUTURE) ×2 IMPLANT
SUT VIC AB 0 CTX 36 (SUTURE) ×2
SUT VIC AB 0 CTX36XBRD ANBCTRL (SUTURE) ×2 IMPLANT
SYR CONTROL 10ML LL (SYRINGE) IMPLANT
TOWEL OR 17X24 6PK STRL BLUE (TOWEL DISPOSABLE) ×4 IMPLANT
TRAY FOLEY CATH 14FR (SET/KITS/TRAYS/PACK) IMPLANT
WATER STERILE IRR 1000ML POUR (IV SOLUTION) ×2 IMPLANT

## 2012-04-06 NOTE — Progress Notes (Signed)
Shivers O2 10 liter nonrebreather mask on, fhts 160 LTV min late decels repetitive, uc adequate abd soft between uc Vag 4-5 100 -1 VTX R  T 102.8 MP 120 Brethine not given due to MP Dr. Su Hilt updated per telephone called for Cesarean section at [phone], discussed risks bleeding, infection, injury to bowel or bladder verbalized understanding. Lavera Guise, CNM

## 2012-04-06 NOTE — Progress Notes (Signed)
Subjective: Postpartum Day 0: Cesarean Delivery due to [facility], chorioamnionitis, FTP Patient reports nausea and reflux/burping--has had 2 episodes of vomiting since coming to [facility].  Received Zofran at 6am and Reglan at 10am.  Working on drinking small amounts of water/ice. Had N/V during labor. Has stood at bedside without syncope or dizziness.  Family just arrived.  Baby in NICU on ventilator, receiving IV ATB for sepsis.  Probable pulmonary hypertension. Required placement of central line due to poor IV access. 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  Objective: Vital signs in last 24 hours: Temp:  [97.6 F (36.4 C)-102 F (38.9 C)] 97.9 F (36.6 C) (08/04 1030) Pulse Rate:  [79-133] 111  (08/04 1030) Resp:  [16-24] 16  (08/04 1030) BP: (100-142)/(50-91) 102/65 mmHg (08/04 1030) SpO2:  [93 %-99 %] 95 % (08/04 1030)  Physical Exam:  General: fatigued, sleepy, no vomiting at present Lochia: appropriate Uterine Fundus: firm Incision: Dressing CDI Abdomen:  Soft, with some distension, NT.  No bowel sounds noted at present. DVT Evaluation: No evidence of DVT seen on physical exam. Negative Homan's sign.  SCDs on. JP drain:   NA Foley draining clear urine.   Basename 04/06/12 0510 04/04/12 2350  HGB 10.2* 12.2  HCT 30.3* 37.5    Assessment/Plan: Day 0 C/S for NRFHR, chorio, FTP NICU infant--dx sepsis, pulmonary hypertension, cleft lip Patient with N/V.  Continue antiemetics. Start Protonix daily. Support to patient and family for concern re:  Baby's status.    Meredith Howard 04/06/2012, 11:22 AM

## 2012-04-06 NOTE — Discharge Summary (Signed)
Physician Discharge Summary  Patient ID: Meredith Howard MRN: [id number] DOB/AGE: 30-Jan-1981 31 y.o.  Admit date: 04/04/2012 Discharge date: 04/06/2012  Admission Diagnoses: prodromal labor 41 week IUP  Discharge Diagnoses:  Genella Rife Principal Problem:  *Status post primary low transverse cesarean section Active Problems:  Depression  Fibromyalgia  Umbilical hernia  Pregnant state, incidental Postpartum Day 0: Cesarean Delivery due to [facility], chorioamnionitis, FTP   Discharged Condition: stable for transport  Hospital Course: prodromal labor 41 week IUP, ROM at 0400, epidural for labor, Cesarean Delivery due to [facility], chorioamnionitis, FTP, transfer to due due to baby being transferred to [facility] for ECMO therapy.    Consults: None  Significant Diagnostic Studies: labs:   Treatments: IV hydration  Discharge Exam: Blood pressure 110/74, pulse 102, temperature 98.1 F (36.7 C), temperature source Oral, resp. rate 18, height 5\' 6"  (1.676 m), weight 185 lb (83.915 kg), last menstrual period 06/23/2011, SpO2 100.00%, unknown if currently breastfeeding. General appearance: alert, cooperative and no distress Objective:  Vital signs in last 24 hours:  Temp: [97.6 F (36.4 C)-102 F (38.9 C)] 97.9 F (36.6 C) (08/04 1030)  Pulse Rate: [79-133] 111 (08/04 1030)  Resp: [16-24] 16 (08/04 1030)  BP: (100-142)/(50-91) 102/65 mmHg (08/04 1030)  SpO2: [93 %-99 %] 95 % (08/04 1030)  Physical Exam:  General: fatigued, sleepy, no vomiting at present  Lungs clear bilaterally, AP RRR,  Lochia: appropriate  Uterine Fundus: firm  Incision: Dressing CDI  Abdomen: Soft, with some distension, NT. No bowel sounds noted at present.  DVT Evaluation: No evidence of DVT seen on physical exam.  Negative Homan's sign. No edema. SCDs on.  JP drain: NA  Foley draining clear urine.   Basename  04/06/12 0510  04/04/12 2350   HGB  10.2*  12.2   HCT  30.3*  37.5    Assessment/Plan:  Patient is stable for  transfer--tolerating po fluids.  Foley still in place.  Filed Vitals:    04/06/12 1230  04/06/12 1330  04/06/12 1440  04/06/12 1630   BP:  118/80   113/72  110/74   Pulse:  109   103  102   Temp:  99.3 F (37.4 C)   97.7 F (36.5 C)  98.1 F (36.7 C)   TempSrc:  Oral   Oral  Oral   Resp:  18  18  18  18    Height:       Weight:       SpO2:  95%  98%  97%  100%   TC from neonatologist--baby being transferred to [facility] for ECMO therapy.  Patient requests transfer to [facility] to accompany baby.  Manfred Arch, CNM onsulted with Dr. Gus Rankin to transfer patient.  Dr. Su Hilt contacted [facility].  Accepting MD will be Dr. Elpidio Anis at [facility].  [facility] should transfer patient to L&D, then the patient will be moved from there to appropriate unit.  Contact # [phone] for Dr. Janee Morn   Disposition: 01-Home or Self Care  Discharge Orders    Future Orders Please Complete By Expires   Nursing communication      Scheduling Instructions:   Transfer to [facility]     Medication List  As of 04/06/2012  7:44 PM   TAKE these medications         diphenhydramine-acetaminophen 25-500 MG Tabs   Commonly known as: TYLENOL PM   Take 1 tablet by mouth at bedtime as needed. For headache/sleep      ferrous sulfate 325 (  65 FE) MG tablet   Take 325 mg by mouth at bedtime.      prenatal multivitamin Tabs   Take 1 tablet by mouth at bedtime.             SignedLavera Guise 04/06/2012, 7:44 PM

## 2012-04-06 NOTE — Op Note (Addendum)
Cesarean Section Procedure Note  Indications: Called and notified pt was febrile tracing reassuring.  Antibiotics started, IVF bolus and tylenol given.  Shortly thereafter called with report of pt having repetitive late decelerations and pt agreeable to proceed with csection.  Pre-operative Diagnosis: non-reassuring fetal heart tracing, chorioamnioitis, failure to progress   Post-operative Diagnosis: non-reassuring fetal heart tracing, chorioamnioitis, failure to progress  Procedure: CESAREAN SECTION  Surgeon: Purcell Nails, MD    Assistants: Lavera Guise, CNM  Anesthesia: Epidural  Anesthesiologist: Dana Allan, MD   Procedure Details  The patient was taken to the operating room secondary to NRFHT and Chorio after the risks, benefits, complications, treatment options, and expected outcomes were discussed with the patient.  The patient concurred with the proposed plan, giving informed consent which was signed and witnessed. The patient was taken to Operating Room 1, identified as Meredith Howard and the procedure verified as C-Section Delivery. A Time Out was held and the above information confirmed.  After induction of anesthesia by obtaining a surgical level via the epidural, the patient was prepped and draped in the usual sterile manner. A Pfannenstiel skin incision was made and carried down through the subcutaneous tissue to the underlying layer of fascia.  The fascia was incised bilaterally and extended transversely bilaterally with the Mayo scissors. Kocher clamps were placed on the inferior aspect of the fascial incision and the underlying rectus muscle was separated from the fascia. The same was done on the superior aspect of the fascial incision.  The peritoneum was identified, entered bluntly and extended manually. The utero-vesical peritoneal reflection was incised transversely and the bladder flap was bluntly freed from the lower uterine segment. A low transverse uterine  incision was made with the scalpel and extended bilaterally with the bandage scissors.  The Alexis retractor was placed.  The infant was delivered in vertex position without difficulty.  After the umbilical cord was clamped and cut, the infant was handed to the awaiting pediatricians.  Cord blood was obtained for evaluation.  The placenta was removed intact and appeared to be within normal limits. The uterus was cleared of all clots and debris. The uterine incision was closed with running interlocking sutures of 0 Vicryl and a second imbricating layer was performed as well.   Bilateral tubes and ovaries appeared to be within normal limits.  Good hemostasis was noted.  Copious irrigation was performed until clear.  The peritoneum was repaired with 2-0 chromic via a running suture.  The fascia was reapproximated with a running suture of 0 Vicryl. The subcutaneous tissue was reapproximated with 3 interrupted sutures of 2-0 plain.  The skin was reapproximated with a subcuticular suture of 3-0 monocryl.  Steristrips were applied.  Instrument, sponge, and needle counts were correct prior to abdominal closure and at the conclusion of the case.  The patient was awaiting transfer to the recovery room in good condition.  Findings: Live female infant with Apgars 4 at one minute and 5 at five minutes and 8 at 10 minutes.  Normal appearing bilateral ovaries and fallopian tubes were noted.  Cord was low in the uterus and wrapped around the body twice.  Cord gases A 7.19 V 7.24  Estimated Blood Loss:  800 ml         Drains: foley to gravity 100 cc of concentrated urine         Total IV Fluids: 3200 ml         Specimens to Pathology: Placenta  Complications:  None; patient tolerated the procedure well.         Disposition: PACU - hemodynamically stable.         Condition: stable  Attending Attestation: I performed the procedure.

## 2012-04-06 NOTE — Progress Notes (Signed)
Feeling better--tolerating fluids, now working on clear liquid tray.   Allergic to Percocet--will change to Vicodin.  Filed Vitals:   04/06/12 1230 04/06/12 1330 04/06/12 1440 04/06/12 1630  BP: 118/80  113/72 110/74  Pulse: 109  103 102  Temp: 99.3 F (37.4 C)  97.7 F (36.5 C) 98.1 F (36.7 C)  TempSrc: Oral  Oral Oral  Resp: 18 18 18 18   Height:      Weight:      SpO2: 95% 98% 97% 100%   Nigel Bridgeman, CNM 04/06/12 6p

## 2012-04-06 NOTE — Progress Notes (Signed)
TC from neonatologist--baby being transferred to [facility] for ECMO therapy. Patient requests transfer to [facility] to accompany baby. Patient is stable for transfer--tolerating po fluids. Foley still in place. Filed Vitals:   04/06/12 1230 04/06/12 1330 04/06/12 1440 04/06/12 1630  BP: 118/80  113/72 110/74  Pulse: 109  103 102  Temp: 99.3 F (37.4 C)  97.7 F (36.5 C) 98.1 F (36.7 C)  TempSrc: Oral  Oral Oral  Resp: 18 18 18 18   Height:      Weight:      SpO2: 95% 98% 97% 100%   Consulted with Dr. Gus Rankin to transfer patient. Dr. Su Hilt contacted [facility]. Accepting MD will be Dr. Elpidio Anis at [facility]. [facility] should transfer patient to L&D, then the patient will be moved from there to appropriate unit. Contact # [phone] for Dr. Janee Morn.  Will request House Coverage determine best means of transferring patient ([facility] transport vs [facility]).  Nigel Bridgeman, CNM 04/06/12 7:40a

## 2012-04-06 NOTE — Addendum Note (Signed)
Addendum  created 04/06/12 1000 by Earmon Phoenix, CRNA   Modules edited:Notes Section

## 2012-04-06 NOTE — Anesthesia Postprocedure Evaluation (Signed)
  Anesthesia Post-op Note  Patient: Meredith Howard  Procedure(s) Performed: Procedure(s) (LRB): CESAREAN SECTION (N/A)  Patient Location: Mother/Baby  Anesthesia Type: Epidural  Level of Consciousness: awake, alert  and oriented  Airway and Oxygen Therapy: Patient Spontanous Breathing SaO2 97% on room air  Post-op Pain: mild  Post-op Assessment: Patient's Cardiovascular Status Stable and Respiratory Function Stable  Post-op Vital Signs: Reviewed and stable  Complications: No apparent anesthesia complications

## 2012-04-06 NOTE — Anesthesia Postprocedure Evaluation (Signed)
Anesthesia Post Note  Patient: Meredith Howard  Procedure(s) Performed: Procedure(s) (LRB): CESAREAN SECTION (N/A)  Anesthesia type: Epidural  Patient location: PACU  Post pain: Pain level controlled  Post assessment: Post-op Vital signs reviewed  Last Vitals:  Filed Vitals:   04/06/12 0300  BP: 129/68  Pulse: 112  Temp:   Resp:     Post vital signs: stable  Level of consciousness: awake  Complications: No apparent anesthesia complications

## 2012-04-06 NOTE — OR Nursing (Signed)
Fundal massage by DLWegner RN 100 cc blood expressed

## 2012-04-06 NOTE — Transfer of Care (Signed)
Immediate Anesthesia Transfer of Care Note  Patient: Meredith Howard  Procedure(s) Performed: Procedure(s) (LRB): CESAREAN SECTION (N/A)  Patient Location: PACU  Anesthesia Type: Epidural  Level of Consciousness: awake, alert  and oriented  Airway & Oxygen Therapy: Patient Spontanous Breathing  Post-op Assessment: Report given to PACU RN  Post vital signs: Reviewed and stable  Complications: No apparent anesthesia complications

## 2012-04-07 ENCOUNTER — Encounter (HOSPITAL_COMMUNITY): Payer: Self-pay | Admitting: Obstetrics and Gynecology

## 2012-04-07 NOTE — Progress Notes (Signed)
Post discharge chart review completed.  

## 2012-04-09 ENCOUNTER — Telehealth: Payer: Self-pay | Admitting: Obstetrics and Gynecology

## 2012-04-09 NOTE — Telephone Encounter (Signed)
Spoke with pt Meredith Howard msg pt states need record faxed to [facility] Meredith Howard her prenatal visits advised pt medical records is working on that pt ants to schd ppv pt has appt 05/20/12 at 4:15 with VL pt voice understanding

## 2012-04-10 ENCOUNTER — Telehealth: Payer: Self-pay | Admitting: Obstetrics and Gynecology

## 2012-04-10 NOTE — Telephone Encounter (Signed)
TC from Dr. Idamae Schuller, OB/Gyn at [facility] and baby were transferred there on 8/4 after delivery, with possibility of baby needing ECMO.  Patient requested transfer there to be near baby s/p primary C/S for NRFHR, chorioamnionitis, and FTP.  Dr. Idamae Schuller advised baby was still critical, but stable.  Has not yet required ECMO, but still may.  Patient overall doing OK, but was treated for endometritis with IV ATB.  Patient coping with baby's issues, but does have significant anxiety and concern, but has great support from husband and family.  Patient likely will be d/c'd home today--she currently plans to travel to see baby in [facility], but may seek lodging at [facility] as option.  Patient plans to follow-up with [place] at [facility] for postpartum care and prn.

## 2012-04-22 ENCOUNTER — Encounter: Payer: Medicaid Other | Admitting: Obstetrics and Gynecology

## 2012-04-23 ENCOUNTER — Telehealth: Payer: Self-pay | Admitting: Obstetrics and Gynecology

## 2012-04-23 ENCOUNTER — Other Ambulatory Visit: Payer: Self-pay | Admitting: Obstetrics and Gynecology

## 2012-04-23 MED ORDER — IBUPROFEN 600 MG PO TABS: 600.0000 mg | ORAL_TABLET | Freq: Four times a day (QID) | ORAL | Status: AC | PRN

## 2012-04-23 MED ORDER — HYDROCODONE-ACETAMINOPHEN 5-325 MG PO TABS: 1.0000 | ORAL_TABLET | Freq: Four times a day (QID) | ORAL | Status: DC | PRN

## 2012-04-23 NOTE — Telephone Encounter (Signed)
Pt called, had C/S done w/ AR on 04/06/12, was transferred w/ her baby to [facility] d/t issues w/ baby, was rx'd Norco by MD @ [facility], pt requesting RF.  Took last Ibuprofen and Norco today.  Rates pain 10/10 when med wears off.  Pain is in incision and radiates down legs, also having some abdominal cramping.  Per SR, can call in Vicodin and Ibuprofen to pt pharmacy (see pt med list).  Pt voices understanding.

## 2012-04-28 ENCOUNTER — Encounter (HOSPITAL_COMMUNITY): Payer: Self-pay | Admitting: *Deleted

## 2012-04-28 ENCOUNTER — Inpatient Hospital Stay (HOSPITAL_COMMUNITY)
Admission: AD | Admit: 2012-04-28 | Discharge: 2012-04-30 | DRG: 776 | Disposition: A | Discharge status: Home / Self Care | Payer: Medicaid Other | Source: Ambulatory Visit | Attending: Obstetrics and Gynecology | Admitting: Obstetrics and Gynecology

## 2012-04-28 ENCOUNTER — Telehealth: Payer: Self-pay | Admitting: Obstetrics and Gynecology

## 2012-04-28 DIAGNOSIS — IMO0002 Reserved for concepts with insufficient information to code with codable children: Principal | ICD-10-CM | POA: Diagnosis present

## 2012-04-28 DIAGNOSIS — O1490 Unspecified pre-eclampsia, unspecified trimester: Secondary | ICD-10-CM | POA: Diagnosis present

## 2012-04-28 DIAGNOSIS — O165 Unspecified maternal hypertension, complicating the puerperium: Secondary | ICD-10-CM

## 2012-04-28 DIAGNOSIS — R300 Dysuria: Secondary | ICD-10-CM

## 2012-04-28 DIAGNOSIS — I10 Essential (primary) hypertension: Secondary | ICD-10-CM

## 2012-04-28 DIAGNOSIS — M797 Fibromyalgia: Secondary | ICD-10-CM | POA: Diagnosis present

## 2012-04-28 DIAGNOSIS — R030 Elevated blood-pressure reading, without diagnosis of hypertension: Secondary | ICD-10-CM

## 2012-04-28 DIAGNOSIS — R51 Headache: Secondary | ICD-10-CM

## 2012-04-28 DIAGNOSIS — R34 Anuria and oliguria: Secondary | ICD-10-CM

## 2012-04-28 DIAGNOSIS — Z98891 History of uterine scar from previous surgery: Secondary | ICD-10-CM | POA: Diagnosis present

## 2012-04-28 LAB — URINE MICROSCOPIC-ADD ON

## 2012-04-28 LAB — URINALYSIS, ROUTINE W REFLEX MICROSCOPIC
Glucose, UA: NEGATIVE mg/dL
Ketones, ur: NEGATIVE mg/dL
Protein, ur: NEGATIVE mg/dL

## 2012-04-28 LAB — COMPREHENSIVE METABOLIC PANEL
CO2: 28 mEq/L (ref 19–32)
Calcium: 9.3 mg/dL (ref 8.4–10.5)
Creatinine, Ser: 0.64 mg/dL (ref 0.50–1.10)
GFR calc Af Amer: >90 mL/min (ref >90)
GFR calc non Af Amer: >90 mL/min (ref >90)
Glucose, Bld: 87 mg/dL (ref 70–99)

## 2012-04-28 LAB — CBC
Hemoglobin: 11.8 g/dL — ABNORMAL LOW (ref 12.0–15.0)
RBC: 4.18 MIL/uL (ref 3.87–5.11)
WBC: 3.9 10*3/uL — ABNORMAL LOW (ref 4.0–10.5)

## 2012-04-28 LAB — LACTATE DEHYDROGENASE: LDH: 242 U/L (ref 94–250)

## 2012-04-28 LAB — URIC ACID: Uric Acid, Serum: 3.2 mg/dL (ref 2.4–7.0)

## 2012-04-28 MED ORDER — LABETALOL HCL 5 MG/ML IV SOLN: 10.0000 mg | Freq: Once | INTRAVENOUS | Status: DC

## 2012-04-28 MED ORDER — MAGNESIUM SULFATE 40 G IN LACTATED RINGERS - SIMPLE
2.0000 g/h | INTRAVENOUS | Status: AC
  Administered 2012-04-29: 4 g/h via INTRAVENOUS
  Administered 2012-04-29 (×2): 2 g/h via INTRAVENOUS
  Filled 2012-04-28 (×2): qty 500

## 2012-04-28 MED ORDER — LACTATED RINGERS IV SOLN
INTRAVENOUS | Status: DC
  Administered 2012-04-29 (×3): via INTRAVENOUS

## 2012-04-28 MED ORDER — HYDROCODONE-ACETAMINOPHEN 5-325 MG PO TABS
2.0000 | ORAL_TABLET | Freq: Four times a day (QID) | ORAL | Status: DC | PRN
  Administered 2012-04-29 – 2012-04-30 (×5): 2 via ORAL
  Filled 2012-04-28 (×5): qty 2

## 2012-04-28 MED ORDER — MAGNESIUM SULFATE 40 G IN LACTATED RINGERS - SIMPLE: 2.0000 g/h | INTRAVENOUS | Status: DC

## 2012-04-28 MED ORDER — MAGNESIUM SULFATE BOLUS VIA INFUSION
4.0000 g | Freq: Once | INTRAVENOUS | Status: DC
  Filled 2012-04-28: qty 500

## 2012-04-28 MED ORDER — COMPLETENATE 29-1 MG PO CHEW
1.0000 | CHEWABLE_TABLET | Freq: Every day | ORAL | Status: DC
  Administered 2012-04-29: 1 via ORAL
  Filled 2012-04-28 (×3): qty 1

## 2012-04-28 MED ORDER — MAGNESIUM SULFATE 40 MG/ML IJ SOLN: 4.0000 g | Freq: Once | INTRAMUSCULAR | Status: DC

## 2012-04-28 MED ORDER — SODIUM CHLORIDE 0.9 % IV SOLN: 250.0000 mL | INTRAVENOUS | Status: DC | PRN

## 2012-04-28 MED ORDER — ZOLPIDEM TARTRATE 5 MG PO TABS
5.0000 mg | ORAL_TABLET | Freq: Every evening | ORAL | Status: DC | PRN
  Administered 2012-04-29: 5 mg via ORAL
  Filled 2012-04-28: qty 1

## 2012-04-28 MED ORDER — DOCUSATE SODIUM 100 MG PO CAPS
100.0000 mg | ORAL_CAPSULE | Freq: Two times a day (BID) | ORAL | Status: DC
  Administered 2012-04-29 – 2012-04-30 (×4): 100 mg via ORAL
  Filled 2012-04-28 (×4): qty 1

## 2012-04-28 MED ORDER — PANTOPRAZOLE SODIUM 40 MG PO TBEC
40.0000 mg | DELAYED_RELEASE_TABLET | Freq: Once | ORAL | Status: AC | PRN
  Filled 2012-04-28: qty 1

## 2012-04-28 MED ORDER — IBUPROFEN 600 MG PO TABS
600.0000 mg | ORAL_TABLET | Freq: Four times a day (QID) | ORAL | Status: DC | PRN
  Administered 2012-04-29 – 2012-04-30 (×4): 600 mg via ORAL
  Filled 2012-04-28 (×4): qty 1

## 2012-04-28 NOTE — MAU Note (Signed)
PT SAYS  C/O CHEST PAIN-  IN BETWEEN BREAST.

## 2012-04-28 NOTE — MAU Note (Signed)
You had a baby on Aug 4. Problems with blood pressure during labor and delivery. Transferred to [facility] for delivery.  A few days following delivery started having SOB, chest pain, swelling was seen in [facility] and Dx with preeclampsia. While in the hospital took a "pill"  To help with swelling. Not eating well since delivery, feeling sick, lots of upper belly pain. Having headaches and taking medication for the headache with little relief.  Have been seeing floaters.

## 2012-04-28 NOTE — Telephone Encounter (Signed)
TC from pt. S/P C/S 3 weeks ago. States is having increased abd pain x 1 week. Also havng incisional and leg apin.  BP 160/86.  Was readmitted PP at [facility].  Had dysuria but now has vaginal pain.  Per VL  Pt advied to go to MAU. Pt verbalizes comprehension. DD notified.

## 2012-04-28 NOTE — Treatment Plan (Signed)
[name], CNM notified of pt chief complaint.  Orders received.  Will come to evaluate pt

## 2012-04-28 NOTE — MAU Note (Signed)
Weird sensation in upper abd, bad headaches, general pain in abd.,  Checked BP yesterday- was a little high.  C/s on 08/04.   Hasn't been eating, has no appetite, has lost ~ 40 #.  Delivered here, was transferred to [facility] because the baby was really sick.  Was dc'd then readmitted: PP pre- eclampsia.

## 2012-04-28 NOTE — MAU Note (Signed)
TRANSFERRED TO  [facility] VIA W/C.  SAYS SHE FEELS  BETTER.

## 2012-04-28 NOTE — Telephone Encounter (Signed)
Triage/surg. pain

## 2012-04-28 NOTE — MAU Note (Signed)
PT WAS EATING - SAID PAIN STARTED AFTER SHE FINISHED  EATING.   HILIARY, CNM NOTIFIED.       SAYS NOW SHE FEELS BETTER.

## 2012-04-29 DIAGNOSIS — O1490 Unspecified pre-eclampsia, unspecified trimester: Secondary | ICD-10-CM | POA: Diagnosis present

## 2012-04-29 DIAGNOSIS — R030 Elevated blood-pressure reading, without diagnosis of hypertension: Secondary | ICD-10-CM

## 2012-04-29 DIAGNOSIS — R51 Headache: Secondary | ICD-10-CM

## 2012-04-29 DIAGNOSIS — IMO0001 Reserved for inherently not codable concepts without codable children: Secondary | ICD-10-CM

## 2012-04-29 LAB — T4, FREE: Free T4: 0.97 ng/dL (ref 0.80–1.80)

## 2012-04-29 LAB — TSH: TSH: 0.289 u[IU]/mL — ABNORMAL LOW (ref 0.350–4.500)

## 2012-04-29 MED ORDER — ONDANSETRON HCL 4 MG/2ML IJ SOLN
4.0000 mg | Freq: Three times a day (TID) | INTRAMUSCULAR | Status: DC | PRN
  Administered 2012-04-29: 4 mg via INTRAVENOUS
  Filled 2012-04-29: qty 2

## 2012-04-29 MED ORDER — BUTALBITAL-APAP-CAFFEINE 50-325-40 MG PO TABS
2.0000 | ORAL_TABLET | ORAL | Status: DC | PRN
  Administered 2012-04-29: 2 via ORAL
  Filled 2012-04-29: qty 2

## 2012-04-29 NOTE — H&P (Signed)
Meredith Howard is an 31 y.o.MB female who presents 3 weeks PP for recurring headaches, intermittent upper abdominal pain, dysuria today with oliguria, and persisting leg pain.  Symptoms have all been present off and on since she was d/'cd from [facility] 04/16/12, but worse today, which she attributes to increased stress and having stayed overnight w/ newborn at [facility] last night and not getting enough rest.   Pain medicine Rx's do help w/ headaches, but feels doesn't resolve complete.  Leg pain she attributes to h/o severe BLE edema during pregnancy and even worse after c/s, but pt also has fibromyalgia.  Pt is concerned with her lack of appetite, and feels she may be losing too much; she has lost approximately 40 lbs since delivery, and recalls pregravid wt around 150lb.   She can only recall 2-3 voids today and color is dark yellow.  She is pumping w/ good output.  Her abdominal pain does not have a pattern to it, and resolves spontaneously; she is unable to elicit pain; not worse w/ positions, meals, or activity.  Her VB is light today, brown/pink; had been "yellow" a few days ago and was down to liners; wearing regular size peripad today.  Reports continued constipation, but improving; BM today.  For breakfast she ate, [place] toast, w/ eggs and bacon; lunch: large bowel of homemade chicken soup;  Unsure total fluid intake.  Pt denies fever or chills.    Delivery and PP course:  Pt admitted 04/04/12 for early labor and augmented.  Primary c/s on 04/06/12 by Dr. Su Hilt for NRFHT, FTP, chorio.  Newborn immediately transferred to NICU post-cesarean, and in less than 48 hours, transferred to [facility] for possible need for ECMO for "pulmonary hypertension."  Pt requested transfer to be close to baby, and she was transferred on 04/07/12 on POD#1--same day as newborn.  She reports BP normal intrapartum and PP.  She reports d/c from [facility] on that Wed or Thurs (8/7 or 8/8), and reports onset of similar symptoms she presents w/  today about one week PP.  She was readmitted to [facility] for PP preEclampsia on 8/11 and d/c'd on 8/14; she did not receive Magnesium sulfate while hospitalized.  Pt did receive one po dose of Lasix to help her "pee," and did not get d/c'd on BP medicine b/c blood pressure had gotten better w/ improvement of edema. Pt did have scans done at [facility] to r/o PE secondary to SOB and LE edema, and were WNL  Pt remained in [place] at [facility], until newborn eventually transferred back to [place] to NICU, and now is on [employer] floor at [facility].  Awaiting copy of pt's records from [facility] at time of this note.  OB History    Grav Para Term Preterm Abortions TAB SAB Ect Mult Living   1 1 1  0 0 0 0 0 0 0        Past Medical History  Diagnosis Date  . Neuromuscular disorder     fibromyalagia  . Back pain   . Gastritis   . Interstitial cystitis   . Endometrial cyst of ovary   . Anxiety     pt states she experiences panic attacks  . GERD (gastroesophageal reflux disease)   . Migraine   . Hernia, hiatal   . Fibroid   . Panic attack   . Panic attacks 01/09/2012  . Fibromyalgia   . Urinary tract infection   . Fibromyalgia   . PID (pelvic inflammatory  disease)   . Trichomonas   . Depression     seeing a therapist  . Preterm labor     preterm contractions, delivered full term    Past Surgical History  Procedure Date  . Laparoscopy 06/18/2011    Procedure: LAPAROSCOPY DIAGNOSTIC;  Surgeon: Hollie Salk C. Marice Potter, MD;  Location: [facility];  Service: Gynecology;  Laterality: Bilateral;  with bilateral chromopertubation with excision of endometrial biopsy  . Laparoscopic endometriosis fulguration 06/2011  . Cesarean section 04/06/2012    Procedure: CESAREAN SECTION;  Surgeon: Purcell Nails, MD;  Location: [facility];  Service: Gynecology;  Laterality: N/A;  Primary cesarean section of baby boy  at 36     Family History  Problem Relation Age of Onset  . Fibroids Mother   . Hypertension Mother   .  Hypertension Father   . Diabetes Father   . Kidney disease Father     kidney stones  . Gout Father   . Anesthesia problems Neg Hx   . Other Neg Hx     Social History:  reports that she has never smoked. She has never used smokeless tobacco. She reports that she does not drink alcohol or use illicit drugs.  Allergies:  Allergies  Allergen Reactions  . Percocet (Oxycodone-Acetaminophen) Itching    Prescriptions prior to admission  Medication Sig Dispense Refill  . docusate sodium (COLACE) 100 MG capsule Take 100 mg by mouth 2 (two) times daily as needed. For constipation      . ferrous sulfate 325 (65 FE) MG tablet Take 325 mg by mouth at bedtime.       Marland Kitchen HYDROcodone-acetaminophen (NORCO/VICODIN) 5-325 MG per tablet Take 1 tablet by mouth every 6 (six) hours as needed for pain.  30 tablet  0  . ibuprofen (ADVIL,MOTRIN) 600 MG tablet Take 1 tablet (600 mg total) by mouth every 6 (six) hours as needed for pain.  45 tablet  1  . Prenatal Vit-Fe Fumarate-FA (PRENATAL MULTIVITAMIN) TABS Take 1 tablet by mouth at bedtime.         Review of Systems  Constitutional: Positive for weight loss and malaise/fatigue.  Eyes: Positive for blurred vision.  Respiratory: Negative.   Cardiovascular:       H/o severe edema in pregnancy and PP; none now  Gastrointestinal: Positive for abdominal pain and constipation.  Genitourinary: Positive for dysuria.  Musculoskeletal: Positive for myalgias.  Skin: Negative.   Neurological: Positive for headaches.       H/o dizziness and SOB, especially notes when her BP is up    Blood pressure 148/89, pulse 75, temperature 98.5 F (36.9 C), temperature source Oral, resp. rate 14, weight 146 lb (66.225 kg), last menstrual period 06/23/2011, SpO2 100.00%, currently breastfeeding. BLOOD PRESSURE RANGE IN MAU:  145-180/87-109  Physical Exam  Constitutional: She is oriented to person, place, and time. She appears well-developed and well-nourished. No distress.        Smiling & pleasant  HENT:  Head: Normocephalic.  Eyes: Pupils are equal, round, and reactive to light.  Cardiovascular: Normal rate and regular rhythm.   Respiratory: Effort normal and breath sounds normal.  GI: Soft. She exhibits no distension and no mass. There is tenderness. There is no rebound and no guarding.       Tender just inferior to umbilicus;  Normal involution. Incision--intact, OTA, no drainage or s/s of infection. Healing well.  Genitourinary:       Pelvic deferred  Musculoskeletal: She exhibits no edema.  Bilateral lower legs and feet tender to touch and complained when I checked for pitting edema and to check for clonus  Neurological: She is alert and oriented to person, place, and time. She has normal reflexes.  Skin: Skin is warm and dry.  Psychiatric: She has a normal mood and affect. Her behavior is normal. Judgment and thought content normal.    Results for orders placed during the hospital encounter of 04/28/12 (from the past 24 hour(s))  URINALYSIS, ROUTINE W REFLEX MICROSCOPIC     Status: Abnormal   Collection Time   04/28/12  6:50 PM      Component Value Range   Color, Urine YELLOW  YELLOW   APPearance CLEAR  CLEAR   Specific Gravity, Urine 1.020  1.005 - 1.030   pH 6.5  5.0 - 8.0   Glucose, UA NEGATIVE  NEGATIVE mg/dL   Hgb urine dipstick TRACE (*) NEGATIVE   Bilirubin Urine NEGATIVE  NEGATIVE   Ketones, ur NEGATIVE  NEGATIVE mg/dL   Protein, ur NEGATIVE  NEGATIVE mg/dL   Urobilinogen, UA 1.0  0.0 - 1.0 mg/dL   Nitrite NEGATIVE  NEGATIVE   Leukocytes, UA TRACE (*) NEGATIVE  URINE MICROSCOPIC-ADD ON     Status: Abnormal   Collection Time   04/28/12  6:50 PM      Component Value Range   Squamous Epithelial / LPF RARE  RARE   WBC, UA 0-2  <3 WBC/hpf   RBC / HPF 0-2  <3 RBC/hpf   Bacteria, UA FEW (*) RARE   Urine-Other MUCOUS PRESENT    COMPREHENSIVE METABOLIC PANEL     Status: Normal   Collection Time   04/28/12  7:52 PM      Component  Value Range   Sodium 137  135 - 145 mEq/L   Potassium 4.1  3.5 - 5.1 mEq/L   Chloride 99  96 - 112 mEq/L   CO2 28  19 - 32 mEq/L   Glucose, Bld 87  70 - 99 mg/dL   BUN 8  6 - 23 mg/dL   Creatinine, Ser 1.61  0.50 - 1.10 mg/dL   Calcium 9.3  8.4 - 09.6 mg/dL   Total Protein 7.5  6.0 - 8.3 g/dL   Albumin 3.8  3.5 - 5.2 g/dL   AST 14  0 - 37 U/L   ALT 11  0 - 35 U/L   Alkaline Phosphatase 112  39 - 117 U/L   Total Bilirubin 0.6  0.3 - 1.2 mg/dL   GFR calc non Af Amer >90  >90 mL/min   GFR calc Af Amer >90  >90 mL/min  CBC     Status: Abnormal   Collection Time   04/28/12  7:52 PM      Component Value Range   WBC 3.9 (*) 4.0 - 10.5 K/uL   RBC 4.18  3.87 - 5.11 MIL/uL   Hemoglobin 11.8 (*) 12.0 - 15.0 g/dL   HCT 04.5  40.9 - 81.1 %   MCV 88.8  78.0 - 100.0 fL   MCH 28.2  26.0 - 34.0 pg   MCHC 31.8  30.0 - 36.0 g/dL   RDW 91.4  78.2 - 95.6 %   Platelets 373  150 - 400 K/uL  LACTATE DEHYDROGENASE     Status: Normal   Collection Time   04/28/12  7:52 PM      Component Value Range   LDH 242  94 - 250 U/L  URIC ACID  Status: Normal   Collection Time   04/28/12  7:52 PM      Component Value Range   Uric Acid, Serum 3.2  2.4 - 7.0 mg/dL    No results found.  Assessment/Plan: 1.  3 weeks & 1d PP, s/p primary c/s for NRFHT, FTP, chorio 2.  H/o "PreEclampsia" w/ readmission to [facility] 8/11-8/14, but not treated w/ Magnesium sulfate 3.  Normal PIH labs 4.  Lactating 5.  Rapid wt loss w/ poor appetite and self-report of oliguria 6.  Increased stressor r/t infant remaining in hospital at [facility] 7.  Probable GERD just before transfer to AICU w/ epigastric/ lower sternal pain 8.  Elevated BP w/ symptoms  1.  Admit to AICU per c/w Dr. Normand Sloop; following brief conversation w/ a Dr. Candie Echevaria (unsure spelling) at [facility], MD reported pt did not receive Magnesium sulfate therapy there. 2.  Magnesium sulfate, 4gm bolus, then 2gm/hr 3.  24 hr urine collection 4.  Routine AICU orders 5.  Breast  pump to bedside 6.  Enc'd pt to increase caloric intake as well as hydration 7.  Support given r/e newborn's continued hospitalization 8.  D/c iron, but continue colace and previous PP pain meds 9.  MD to follow   Tiandre Teall H 04/29/2012, 12:39 AM

## 2012-04-29 NOTE — Progress Notes (Signed)
UR chart review completed.  

## 2012-04-29 NOTE — Consult Note (Signed)
Mom still in AICU. She ask me to come and evaluate her for engorgement. I explained that she is probably just very full. She is having trouble with an over production of mil, which is preventing her from sleeping. i suggested instead of pumping until empty, to try pumping until comfortable today. This may decrease her supply some, or she may fill up sooner. Mom felt much better after pumping for about 10 - 15 minutes - the knots of milk were gone, and her breasts were softer. She expressed about 90 mls , as compared to her usual 240. I will follow up with mom tomorrow.

## 2012-04-29 NOTE — Consult Note (Signed)
Mom readmitted to AICU post partum. Her baby was in NICU , and recently transferred to Pediatrics at [facility]. She is pumping and dumping, because she is afraid the Mg she is on will make her baby sleepy. I assured her she did not need to discard her milk, but she is fine with this, since she has plenty of milk frozen at home. I told her to call for me, if there is any way I can assist her.

## 2012-04-29 NOTE — Progress Notes (Signed)
Post Partum Day 23 Subjective: tolerating PO.  C/o headaches  Objective: Blood pressure 133/79, pulse 82, temperature 98 F (36.7 C), temperature source Oral, resp. rate 18, height 5\' 7"  (1.702 m), weight 144 lb 3.2 oz (65.409 kg), last menstrual period 06/23/2011, SpO2 100.00%, currently breastfeeding.  Physical Exam:  General: alert, cooperative, appears stated age, fatigued, mild distress and c/o headache and left hip pain Lochia: appropriate Uterine Fundus: firm Incision: healing well DVT Evaluation: No evidence of DVT seen on physical exam.Pt does have tenderness throughout both legs that has been present since delivery TSH  Slightly decreased, but T3 and T4 nl  Basename 04/28/12 1952  HGB 11.8*  HCT 37.1  Weight 144 U/o 2300cc since 6 am  Assessment Elevated BPS.  Cannot r/o postpartum pre-eclampsia Fibromyalgia Headache   /Plan: Continue magnesium through 1am (24 hrs) Complete 24 hr urine Continue labetalol for  BP contrtol    LOS: 1 day   Braylan Faul P 04/29/2012, 3:21 PM

## 2012-04-30 DIAGNOSIS — R109 Unspecified abdominal pain: Secondary | ICD-10-CM

## 2012-04-30 DIAGNOSIS — R34 Anuria and oliguria: Secondary | ICD-10-CM

## 2012-04-30 DIAGNOSIS — R51 Headache: Secondary | ICD-10-CM

## 2012-04-30 DIAGNOSIS — R300 Dysuria: Secondary | ICD-10-CM

## 2012-04-30 DIAGNOSIS — R030 Elevated blood-pressure reading, without diagnosis of hypertension: Secondary | ICD-10-CM

## 2012-04-30 LAB — CREATININE CLEARANCE, URINE, 24 HOUR
Collection Interval-CRCL: 24 hours
Creatinine Clearance: 113 mL/min (ref 75–115)
Creatinine, 24H Ur: 1041 mg/d (ref 700–1800)
Creatinine, Urine: 22.27 mg/dL
Creatinine: 0.64 mg/dL (ref 0.50–1.10)

## 2012-04-30 LAB — PROTEIN, URINE, 24 HOUR: Collection Interval-UPROT: 24 hours

## 2012-04-30 MED ORDER — NIFEDIPINE ER OSMOTIC RELEASE 30 MG PO TB24: 30.0000 mg | ORAL_TABLET | Freq: Every day | ORAL | Status: DC

## 2012-04-30 MED ORDER — HYDROCODONE-ACETAMINOPHEN 5-325 MG PO TABS: 2.0000 | ORAL_TABLET | Freq: Four times a day (QID) | ORAL | Status: AC | PRN

## 2012-04-30 MED ORDER — NIFEDIPINE ER 30 MG PO TB24: 30.0000 mg | ORAL_TABLET | Freq: Once | ORAL | Status: DC

## 2012-04-30 MED ORDER — NIFEDIPINE ER 30 MG PO TB24
30.0000 mg | ORAL_TABLET | Freq: Once | ORAL | Status: AC
  Administered 2012-04-30: 30 mg via ORAL
  Filled 2012-04-30: qty 1

## 2012-04-30 MED ORDER — IBUPROFEN 600 MG PO TABS: 600.0000 mg | ORAL_TABLET | Freq: Four times a day (QID) | ORAL | Status: AC | PRN

## 2012-04-30 MED ORDER — RANITIDINE HCL 150 MG PO TABS: 150.0000 mg | ORAL_TABLET | Freq: Two times a day (BID) | ORAL | Status: DC

## 2012-04-30 NOTE — Discharge Summary (Signed)
Obstetric Discharge Summary: [name] was admitted 3 weeks post LTCS with symptoms of headache, intermittent abdominal pain, dysuria and oliguria. She also complained of persistent leg pain. She also had significant weight loss of 40 lbs in the past 3 weeks. Her newborn had been admitted to [facility] post delivery or stabilization and evaluation. [name] was treated for Pre E, due to elevate 24hr urine protein level of 254 and elevated b/p. Treatment was Magnesium S ulfate x 24hrs. The patient tolerated this treatment well but continues to have elevated B/p readings. The patient was discharge today  04/30/12 to home and will continue on antihypertensive medication Procardia XL30 mgs po daily. A f/u appointment has been scheduled for Friday 05/02/12 to assess b/p control. The patient also has been d/c home with Vicodin 1-2 tabs po Q 6hrly PRN , Zantac 150mg s po BID, Ibuprofen 600mg  po Q 6hrly PRN, PNV and po Iron and Colace for constipation. The patient's condition was stable at time of discharge to home. Reason for Admission: patient has admitted 3 weeks postpartum with complaint of headache, intermittent abdominal pain, dysuria and oliguria Prenatal Procedures: ultrasound Intrapartum Procedures: cesarean: low cervical, transverse Postpartum Procedures: none Complications-Operative and Postpartum: newborn was transferred to [facility] for evaulation and stablization  Temp:  [97.7 F (36.5 C)-98.5 F (36.9 C)] 98.5 F (36.9 C) (08/28 0900) Pulse Rate:  [66-92] 86  (08/28 1020) Resp:  [13-18] 16  (08/28 1020) BP: (126-152)/(68-97) 136/93 mmHg (08/28 1200) SpO2:  [98 %-100 %] 100 % (08/28 1020) Weight:  [141 lb (63.957 kg)] 141 lb (63.957 kg) (08/28 0900)    Hemoglobin  Date Value Range Status  04/28/2012 11.8* 12.0 - 15.0 g/dL Final  03/11/2955 21.3   Final     HCT  Date Value Range Status  04/28/2012 37.1  36.0 - 46.0 % Final  09/06/2011 36   Final    Physical Exam:  General: alert, cooperative and  no distress Lochia: appropriate Uterine Fundus: firm Incision: healing well DVT Evaluation: No evidence of DVT seen on physical exam. Negative Homan's sign.  Discharge Diagnoses: Pre Eclampsia with late onset of symptoms - readmission at 3 weeks pp.  Discharge Information: Date: 04/30/2012 Activity: pelvic rest Diet: iron rich diet Medications: PNV, Ibuprofen, Colace, Iron and Vicodin Condition: stable Instructions: refer to practice specific booklet Discharge to: home Follow-up Information    Follow up with [facility]. Schedule an appointment as soon as possible for a visit in 1 week. (B/P check)        Patient has appt for Friday 05/02/12 for B/p check and evaluation.  Newborn Data: This patient has no babies on file. Baby remains at [facility] for further stablization after transfer from [facility]. Marland Kitchen  Morris Longenecker, CNM. 04/30/2012, 10:36 AM

## 2012-04-30 NOTE — Consult Note (Signed)
Mom discharged to home today - feeling better. I told her that if while Meredith Howard is a patient on [employer], and she would like lactation help, to call lactation and we will arrange a time to go th cone to help her. She also knows to call for any questions/concerns

## 2012-05-02 ENCOUNTER — Encounter: Payer: Self-pay | Admitting: Obstetrics and Gynecology

## 2012-05-02 ENCOUNTER — Ambulatory Visit (INDEPENDENT_AMBULATORY_CARE_PROVIDER_SITE_OTHER): Payer: Medicaid Other | Admitting: Obstetrics and Gynecology

## 2012-05-02 VITALS — BP 122/78 | Temp 98.5°F | Wt 143.0 lb

## 2012-05-02 DIAGNOSIS — O1404 Mild to moderate pre-eclampsia, complicating childbirth: Secondary | ICD-10-CM

## 2012-05-02 DIAGNOSIS — IMO0002 Reserved for concepts with insufficient information to code with codable children: Secondary | ICD-10-CM

## 2012-05-02 NOTE — Progress Notes (Signed)
Pt is 3 weeks pp.  She was redmitted for PP preeclampsia and received MGSO4.  She now has some abdominal pain and occ headaches BP 122/78  Temp 98.5 F (36.9 C)  Wt 143 lb (64.864 kg)  Breastfeeding? Yes Physical Examination: General appearance - alert, well appearing, and in no distress Mental status - alert, oriented to person, place, and time Chest - clear to auscultation, no wheezes, rales or rhonchi, symmetric air entry Heart - normal rate, regular rhythm, normal S1, S2, no murmurs, rubs, clicks or gallops Abdomen - soft, nontender, nondistended, no masses or organomegaly Incision CDI Pelvic - normal external genitalia, vulva, vagina, cervix, uterus and adnexa Pp preeclampsia Pt doing well.  BP controlled on procardia  Continue pelvic rest and f/u 3 weeks for routine pp exam

## 2012-05-15 ENCOUNTER — Telehealth: Payer: Self-pay | Admitting: Obstetrics and Gynecology

## 2012-05-15 ENCOUNTER — Other Ambulatory Visit: Payer: Self-pay | Admitting: Obstetrics and Gynecology

## 2012-05-15 NOTE — Telephone Encounter (Signed)
TC to pt.  States since delivery having buttocks pain radiating to lt hip.  Had leg pain during pregnancy. Has tried Ibuprofen q 6 hours and warm bath. No change. Also states has some tenderness in incisional area.  No signs of infection. Explained may take time for  Complete healing.  Per VL pt may take Flexoril the patient states has RX.  To call in no improvement or keep appt 05/20/12.Pt verbalizes comprehension.

## 2012-05-15 NOTE — Telephone Encounter (Signed)
Triage/post op pain

## 2012-05-20 ENCOUNTER — Ambulatory Visit (INDEPENDENT_AMBULATORY_CARE_PROVIDER_SITE_OTHER): Payer: Medicaid Other | Admitting: Obstetrics and Gynecology

## 2012-05-20 ENCOUNTER — Encounter: Payer: Self-pay | Admitting: Obstetrics and Gynecology

## 2012-05-20 VITALS — BP 124/68 | Ht 67.0 in | Wt 138.0 lb

## 2012-05-20 DIAGNOSIS — R102 Pelvic and perineal pain: Secondary | ICD-10-CM

## 2012-05-20 DIAGNOSIS — N949 Unspecified condition associated with female genital organs and menstrual cycle: Secondary | ICD-10-CM

## 2012-05-20 DIAGNOSIS — O8612 Endometritis following delivery: Secondary | ICD-10-CM

## 2012-05-20 DIAGNOSIS — Z124 Encounter for screening for malignant neoplasm of cervix: Secondary | ICD-10-CM

## 2012-05-20 DIAGNOSIS — IMO0002 Reserved for concepts with insufficient information to code with codable children: Secondary | ICD-10-CM

## 2012-05-20 DIAGNOSIS — F53 Postpartum depression: Secondary | ICD-10-CM

## 2012-05-20 DIAGNOSIS — O1495 Unspecified pre-eclampsia, complicating the puerperium: Secondary | ICD-10-CM

## 2012-05-20 LAB — CBC WITH DIFFERENTIAL/PLATELET
Basophils Relative: 0 % (ref 0–1)
Eosinophils Absolute: 0.1 10*3/uL (ref 0.0–0.7)
Eosinophils Relative: 1 % (ref 0–5)
HCT: 38.2 % (ref 36.0–46.0)
Hemoglobin: 13.1 g/dL (ref 12.0–15.0)
Lymphs Abs: 1.5 10*3/uL (ref 0.7–4.0)
MCH: 28.9 pg (ref 26.0–34.0)
MCHC: 34.3 g/dL (ref 30.0–36.0)
MCV: 84.1 fL (ref 78.0–100.0)
Monocytes Absolute: 0.3 10*3/uL (ref 0.1–1.0)
Monocytes Relative: 7 % (ref 3–12)
RBC: 4.54 MIL/uL (ref 3.87–5.11)

## 2012-05-20 MED ORDER — HYDROCODONE-ACETAMINOPHEN 5-500 MG PO TABS: 1.0000 | ORAL_TABLET | ORAL | Status: DC | PRN

## 2012-05-20 MED ORDER — SERTRALINE HCL 25 MG PO TABS: 25.0000 mg | ORAL_TABLET | Freq: Every day | ORAL | Status: DC

## 2012-05-20 MED ORDER — IBUPROFEN 400 MG PO TABS: 400.0000 mg | ORAL_TABLET | Freq: Four times a day (QID) | ORAL | Status: DC | PRN

## 2012-05-20 NOTE — Progress Notes (Signed)
Meredith Howard  is 6 weeks postpartum following a primary cesarean section, low transverse incision at 41 gestational weeks Date: 04/06/2012 female baby named Aiden delivered by [place].  Breastfeeding: no Bottlefeeding:  yes  Post-partum blues / depression:  yes baby at [facility] until yesterday EPDS score: 19 History of abnormal Pap:  no  Last Pap: Date  Pt unsure Gestational diabetes:  no  Contraception:  Desires unsure  Normal urinary function:  yes Normal GI function:  yes Returning to work:  no  Pt c/o still having soreness around incision area. Also c/o breast pain.

## 2012-05-21 LAB — PAP IG W/ RFLX HPV ASCU

## 2012-05-22 DIAGNOSIS — O8612 Endometritis following delivery: Secondary | ICD-10-CM | POA: Insufficient documentation

## 2012-05-22 DIAGNOSIS — O99345 Other mental disorders complicating the puerperium: Secondary | ICD-10-CM | POA: Insufficient documentation

## 2012-05-22 NOTE — Progress Notes (Signed)
Meredith Howard is a 31 y.o. female who presents for a postpartum visit.  She is s/p primary LTCS on 04/06/12 for NRFHR, with newborn son, Aiden, transferred to NICU at birth due to distress, then transferred to [facility] for pulmonary hypertension.  Patient was also transferred to [facility] on the day of delivery to be closer to baby.  She then had endometritus while at [facility], and pp pre-eclampsia 3 weeks after delivery, diagnosed after persistence swelling and HA. She had evaluation for peripheral DVT while at [facility].  Baby was able to return to NICU on 8/14, then transferred to Peds at [facility] for further convalescent care and feeding therapy.  He also has partial cleft lip, with follow-up planned with pediatric surgeon.  Baby was d/c'd yesterday to home--still has persistent pulmonary hypertension and some feeding issues, but overall doing well.  He will continue to have f/u with neurologists, peds.  Patient is now bottlefeeding, as of yesterday--had been pumping for breastmik, with major production.  Patient reports persistence of abdominal pain, incisional pain, back pain, and depression--hx of chronic issues with pain and depression.  Hx remarkable for: Patient Active Problem List  Diagnosis  . Chronic pelvic pain in female  . Interstitial cystitis  . Dysmenorrhea  . IBS (irritable bowel syndrome)  . Panic attacks  . Depression  . Fibromyalgia  . Migraine headache  . Fibroids  . Preterm uterine contractions, antepartum  . Umbilical hernia  . Hyperthyroidism complicating pregnancy  . Pregnant state, incidental  . Status post primary low transverse cesarean section  . Preeclampsia  Endometritis and pre-eclampsia were dx pp--endometritis was while patient was still in hospital pp at [facility].  PP pre-eclampsia was dx 3 weeks pp when patient returned to [facility].    PPDS = 19, but denies SI/HI.  Hx chronic depression.  Denies any difficulty caring for self or baby.  Reports little sleep when baby in hospital,  due to patient remaining with infant 24-7.  Contraception plan: Unsure at present--needs further time to decide   I have fully reviewed the prenatal and intrapartum course   Patient has not been sexually active since delivery.   The following portions of the patient's history were reviewed and updated as appropriate: allergies, current medications, past family history, past medical history, past social history, past surgical history and problem list.  Review of Systems Pertinent items are noted in HPI.   Objective:    BP 124/68  Ht 5\' 7"  (1.702 m)  Wt 138 lb (62.596 kg)  BMI 21.61 kg/m2  LMP 05/17/2012  Breastfeeding? No  General:  alert, cooperative and no distress.  Is very well-groomed, FOB present with her today.     Lungs: clear to auscultation bilaterally  Heart:  regular rate and rhythm, S1, S2 normal, no murmur  Abdomen: soft, non-tender; bowel sounds normal; no masses,  no organomegaly.  Well-healed LTCS incision.   Vulva:  normal  Vagina: normal vagina  Cervix:  Normal, no CMT  Uterus: normal size, contour, position, consistency, mobility, non-tender, well-involuted, moderate tenderness persists to even brief touch--appears to be more hypersensitivity than pain.  Adnexa:  normal adnexa       Breasts still full/lactating.      Assessment:    S/P primary LTCS for NRFHR--baby to [facility] for pulmonary hypertension PP depression--hx chronic depression and anxiety Episode of pp endometritus--acute sx resolved Persistent pp pain in abdomen and back Hx fibromyagia PP pre-eclampsia--on Procardia XL, s/p 24 hours magnesium  Infant with prolonged hospitalization  due to pulmonary hypertension--home yesterday. Pap smear not done at today's visit. --will defer to future visit. Bottlefeeding now  Plan:  Follow-up with me in 3 weeks to evaluate pp depression and BP status.  Will discuss contraception again then. Persistent pain may be associated with fibromyalgia Rxs:   Zoloft 25 mg po q day Continue Procardia 30 mg XL daily Refills on Ibuprophen and Vicodin Check CBC with diff today Declines psych/counseling referral at present.  If patient feels emotional status has not improved by NV, will refer to counselor/psychiatrist. Reviewed management of breasts during transition to bottlefeeding.  Nigel Bridgeman CNM, [place] 05/22/2012 8:36 AM

## 2012-05-27 ENCOUNTER — Telehealth: Payer: Self-pay | Admitting: Obstetrics and Gynecology

## 2012-05-27 DIAGNOSIS — I10 Essential (primary) hypertension: Secondary | ICD-10-CM

## 2012-05-27 NOTE — Telephone Encounter (Signed)
Medication Refill

## 2012-05-28 ENCOUNTER — Telehealth (HOSPITAL_COMMUNITY): Payer: Self-pay | Admitting: Lactation Services

## 2012-05-28 ENCOUNTER — Telehealth: Payer: Self-pay

## 2012-05-28 MED ORDER — NIFEDIPINE ER OSMOTIC RELEASE 30 MG PO TB24: 30.0000 mg | ORAL_TABLET | Freq: Every day | ORAL | Status: DC

## 2012-05-28 NOTE — Telephone Encounter (Signed)
Tc from pt. Pt noticed a small,slightly hard lump in right breast x 1 week. Pt is weaning baby off breastfeeding.  No fever. No cracked/reddened nipples. No nipple d/c. Informed pt to apply warm compress to area; could possibly be a clogged milk duct. Pt given number to lactation consultant. Pt c/o back pain, ha's(h/o migraines), and cramping. Headache is better today. Occ cramping with urination. Denies dysuria. Pt admits to not drinking adequate water. Pt informed to increase water intake(64oz daily), cranberry juice, cont Vicodin/Ibuprofen prn. Pt informed to cb x24 hours with report if no improvement. Pt voices understanding.

## 2012-05-28 NOTE — Telephone Encounter (Signed)
Mom reports having a lump in her R breast for the last week.  Both breasts are very painful.  Mom began decreasing her pumping 10 days ago.  Prior to that, she was pumping q3-4 hours for 20-30 min.  She would yield 9-11 oz w/each pumping session.  Mom began to decrease pumping sessions b/c of baby's lactose intolerance (per Mom).  Mom pumped only 1 time yesterday and 1 time the day before.  Mom informed that it is likely a plugged duct and she needs to take more time to dial down her supply.  Mom instructed to pump, use moist heat, and massage. An outpatient appt was scheduled for 2:30 tomorrow (9/26) to monitor lump and to spend more time answering Mom's questions.

## 2012-05-28 NOTE — Telephone Encounter (Signed)
Tc to pt per telephone call. Rx for Procardia XL 30mg  #30 e-pres to pharm on file. FU appt sched 06/13/12 with vl. Pt aware.

## 2012-05-29 ENCOUNTER — Ambulatory Visit (HOSPITAL_COMMUNITY): Admission: RE | Admit: 2012-05-29 | Payer: Medicaid Other | Source: Ambulatory Visit

## 2012-06-09 ENCOUNTER — Telehealth: Payer: Self-pay | Admitting: Obstetrics and Gynecology

## 2012-06-09 ENCOUNTER — Inpatient Hospital Stay (HOSPITAL_COMMUNITY)
Admission: AD | Admit: 2012-06-09 | Discharge: 2012-06-10 | Disposition: A | Discharge status: Home / Self Care | Payer: Medicaid Other | Source: Ambulatory Visit | Attending: Obstetrics and Gynecology | Admitting: Obstetrics and Gynecology

## 2012-06-09 ENCOUNTER — Inpatient Hospital Stay (HOSPITAL_COMMUNITY): Payer: Medicaid Other

## 2012-06-09 ENCOUNTER — Encounter (HOSPITAL_COMMUNITY): Payer: Self-pay | Admitting: *Deleted

## 2012-06-09 DIAGNOSIS — R109 Unspecified abdominal pain: Secondary | ICD-10-CM

## 2012-06-09 DIAGNOSIS — R1033 Periumbilical pain: Secondary | ICD-10-CM | POA: Insufficient documentation

## 2012-06-09 DIAGNOSIS — R141 Gas pain: Secondary | ICD-10-CM

## 2012-06-09 DIAGNOSIS — R142 Eructation: Secondary | ICD-10-CM

## 2012-06-09 DIAGNOSIS — M545 Low back pain: Secondary | ICD-10-CM

## 2012-06-09 DIAGNOSIS — N949 Unspecified condition associated with female genital organs and menstrual cycle: Secondary | ICD-10-CM

## 2012-06-09 DIAGNOSIS — R1011 Right upper quadrant pain: Secondary | ICD-10-CM | POA: Insufficient documentation

## 2012-06-09 DIAGNOSIS — F411 Generalized anxiety disorder: Secondary | ICD-10-CM

## 2012-06-09 DIAGNOSIS — O99893 Other specified diseases and conditions complicating puerperium: Secondary | ICD-10-CM | POA: Insufficient documentation

## 2012-06-09 DIAGNOSIS — R143 Flatulence: Secondary | ICD-10-CM

## 2012-06-09 DIAGNOSIS — G43901 Migraine, unspecified, not intractable, with status migrainosus: Secondary | ICD-10-CM

## 2012-06-09 LAB — CBC WITH DIFFERENTIAL/PLATELET
Eosinophils Relative: 1 % (ref 0–5)
HCT: 40.3 % (ref 36.0–46.0)
Hemoglobin: 13.2 g/dL (ref 12.0–15.0)
Lymphocytes Relative: 52 % — ABNORMAL HIGH (ref 12–46)
MCHC: 32.8 g/dL (ref 30.0–36.0)
MCV: 85.7 fL (ref 78.0–100.0)
Monocytes Absolute: 0.2 10*3/uL (ref 0.1–1.0)
Monocytes Relative: 7 % (ref 3–12)
Neutro Abs: 1.5 10*3/uL — ABNORMAL LOW (ref 1.7–7.7)
RDW: 13.6 % (ref 11.5–15.5)

## 2012-06-09 LAB — COMPREHENSIVE METABOLIC PANEL
BUN: 7 mg/dL (ref 6–23)
CO2: 30 mEq/L (ref 19–32)
Calcium: 9.9 mg/dL (ref 8.4–10.5)
Chloride: 95 mEq/L — ABNORMAL LOW (ref 96–112)
Creatinine, Ser: 0.65 mg/dL (ref 0.50–1.10)
GFR calc Af Amer: >90 mL/min (ref >90)
GFR calc non Af Amer: >90 mL/min (ref >90)
Total Bilirubin: 0.3 mg/dL (ref 0.3–1.2)

## 2012-06-09 LAB — URINALYSIS, ROUTINE W REFLEX MICROSCOPIC
Glucose, UA: NEGATIVE mg/dL
Ketones, ur: NEGATIVE mg/dL
Leukocytes, UA: NEGATIVE
Protein, ur: NEGATIVE mg/dL
pH: 5.5 (ref 5.0–8.0)

## 2012-06-09 LAB — URINE MICROSCOPIC-ADD ON

## 2012-06-09 MED ORDER — BUTALBITAL-APAP-CAFFEINE 50-325-40 MG PO TABS
2.0000 | ORAL_TABLET | Freq: Once | ORAL | Status: AC
  Administered 2012-06-09: 2 via ORAL
  Filled 2012-06-09: qty 2

## 2012-06-09 NOTE — MAU Provider Note (Signed)
History    This patient presenentd this evening with complaints of Peri-umbilical pain and Rt Upper Quadrant pain. The patient is 8 weeks PLTCS and had Pre E postpartum and was treated with Magnesium Sulfate x 24hrs. Her B/p has remained elevated since delivery and remains on Procardia XL 30 mgs po daily. She had seen VL at [facility] x 10 days with the same complaint of abdominal pain. Tonight also complains of Migraine headache. She has been taking Excedrin OTC for this problem. The patient has been referred to the Neurology for Migraine and was assessed but dis not want to taking any medications during pregnancy. She needs to f/u with Neurology for further intervention for her Migraine.   CSN: [id number]  Arrival date and time: 06/09/12 1940   None     Chief Complaint  Patient presents with  . Abdominal Pain  . Migraine  . Back Pain  . Vaginal Bleeding   HPI  OB History    Grav Para Term Preterm Abortions TAB SAB Ect Mult Living   1 1 1  0 0 0 0 0 0 1      Past Medical History  Diagnosis Date  . Neuromuscular disorder     fibromyalagia  . Back pain   . Gastritis   . Interstitial cystitis   . Endometrial cyst of ovary   . Anxiety     pt states she experiences panic attacks  . GERD (gastroesophageal reflux disease)   . Migraine   . Hernia, hiatal   . Fibroid   . Panic attack   . Panic attacks 01/09/2012  . Fibromyalgia   . Urinary tract infection   . Fibromyalgia   . PID (pelvic inflammatory disease)   . Trichomonas   . Depression     seeing a therapist  . Preterm labor     preterm contractions, delivered full term    Past Surgical History  Procedure Date  . Laparoscopy 06/18/2011    Procedure: LAPAROSCOPY DIAGNOSTIC;  Surgeon: Hollie Salk C. Marice Potter, MD;  Location: [facility];  Service: Gynecology;  Laterality: Bilateral;  with bilateral chromopertubation with excision of endometrial biopsy  . Laparoscopic endometriosis fulguration 06/2011  . Cesarean section 04/06/2012   Procedure: CESAREAN SECTION;  Surgeon: Purcell Nails, MD;  Location: [facility];  Service: Gynecology;  Laterality: N/A;  Primary cesarean section of baby boy  at 39     Family History  Problem Relation Age of Onset  . Fibroids Mother   . Hypertension Mother   . Hypertension Father   . Diabetes Father   . Kidney disease Father     kidney stones  . Gout Father   . Anesthesia problems Neg Hx   . Other Neg Hx     History  Substance Use Topics  . Smoking status: Never Smoker   . Smokeless tobacco: Never Used  . Alcohol Use: No    Allergies:  Allergies  Allergen Reactions  . Percocet (Oxycodone-Acetaminophen) Itching    Prescriptions prior to admission  Medication Sig Dispense Refill  . docusate sodium (COLACE) 100 MG capsule Take 100 mg by mouth 2 (two) times daily as needed. For constipation      . ferrous sulfate 325 (65 FE) MG tablet Take 325 mg by mouth at bedtime.       Marland Kitchen HYDROcodone-acetaminophen (VICODIN) 5-500 MG per tablet Take 1 tablet by mouth every 4 (four) hours as needed for pain.  36 tablet  0  . ibuprofen (ADVIL,MOTRIN) 400 MG tablet  Take 1 tablet (400 mg total) by mouth every 6 (six) hours as needed for pain.  30 tablet  2  . NIFEdipine (PROCARDIA XL) 30 MG 24 hr tablet Take 1 tablet (30 mg total) by mouth daily.  30 tablet  0  . NIFEdipine (PROCARDIA-XL/ADALAT CC) 30 MG 24 hr tablet Take 1 tablet (30 mg total) by mouth once.  30 tablet  0  . Prenatal Vit-Fe Fumarate-FA (PRENATAL MULTIVITAMIN) TABS Take 1 tablet by mouth at bedtime.       . ranitidine (ZANTAC) 150 MG tablet Take 1 tablet (150 mg total) by mouth 2 (two) times daily.  60 tablet  1  . sertraline (ZOLOFT) 25 MG tablet Take 1 tablet (25 mg total) by mouth daily.  30 tablet  3    Review of Systems  Constitutional: Positive for malaise/fatigue.       The patient states that she has sleep deprivation with the baby feeding at night. This is contributing to more persistent migraine headaches daily    HENT: Negative.   Eyes: Positive for blurred vision.       Blurred vision from Migraine that she has on visit to MAU  Respiratory: Negative.   Cardiovascular:       B/p elevated on admission to MAU BP 137/100  Pulse 99  Temp 97.8 F (36.6 C)  Resp 18  Ht 5\' 7"  (1.702 m)  Wt 137 lb 3.2 oz (62.234 kg)  BMI 21.49 kg/m2  LMP 05/17/2012. The patient is taking Procardia XL 30 mgs po daily.   Gastrointestinal: Positive for abdominal pain.       Peri- umbilical pain - post - op x 8 weeks  And Rt sided upper abdominal pain.  Genitourinary: Negative.   Musculoskeletal: Negative.   Skin: Negative.   Neurological: Negative.   Endo/Heme/Allergies: Negative.   Psychiatric/Behavioral: Negative.    Physical Exam   Blood pressure 135/103, pulse 99, temperature 97.8 F (36.6 C), resp. rate 18, height 5\' 7"  (1.702 m), weight 137 lb 3.2 oz (62.234 kg), last menstrual period 05/17/2012, not currently breastfeeding.  Physical Exam  Constitutional: She appears well-developed. She appears distressed.       Has anxiety disorder and has refused medication.  Has been offered Zoloft and offered Buspar- refused both.  Eyes: Conjunctivae normal and EOM are normal. Pupils are equal, round, and reactive to light.  Neck: Normal range of motion. Neck supple.  GI: Soft. Bowel sounds are normal. There is tenderness. There is rebound.       Rt sided per- umbilical rebound pain  Musculoskeletal: She exhibits tenderness.       Peri- umbilical Rt sided rebound tenderness.  Skin: Skin is warm and dry.  Psychiatric:       Anxious and blood pressure elevated    MAU Course  Procedures Labs: U/A with Culture          CBC, CMP Complete abdominal ultrasound to r/o post op complication of hernia or fluid accumulation. Fioricet 2 tabs po x 1 dose for migraine.  Assessment and Plan  USS: negative findings and WNL's Abdomen: bowel with with gas. CBC, CMP: WNLs Advised Gas- X  OTC medication ( Mylicon 80 msg  Q6 hrly PRN Prescription for Fioricet 1 -2 tabs po Q 6 hrly PRN To f/u with [facility] x 5 days as scheduled To f/u with Neurologist - to make appointment.       Earl Gala, CNM 06/09/2012, 9:26 PM

## 2012-06-09 NOTE — Telephone Encounter (Signed)
TC TO PT REGARDING MESSAGE. PT STATES THAT SHE HAD AN C-SECTION IN 8/13 AND SHE IS HAVING CRAMPING WITH URINATION AND A LITTLE BACK PAIN. SCHEDULE APPT ON 06/10/12 WITH [facility]. PT VOICED UNDERSTANDING.

## 2012-06-09 NOTE — MAU Note (Signed)
Pt LMP 05/18/2012, post C/S 04/06/2012, having lower abd pain x 2 wks, increased migraine headaches, irregular vag bleeding  And lower back pain.

## 2012-06-09 NOTE — MAU Note (Signed)
Pt will go to U/S around 2330 due to last meal.

## 2012-06-10 MED ORDER — SIMETHICONE 80 MG PO CHEW: 80.0000 mg | CHEWABLE_TABLET | Freq: Four times a day (QID) | ORAL | Status: DC | PRN

## 2012-06-10 MED ORDER — BUTALBITAL-APAP-CAFFEINE 50-325-40 MG PO TABS
1.0000 | ORAL_TABLET | Freq: Four times a day (QID) | ORAL | Status: DC | PRN
Start: 2012-06-10 — End: 2012-09-28

## 2012-06-11 ENCOUNTER — Encounter: Payer: Medicaid Other | Admitting: Obstetrics and Gynecology

## 2012-06-13 ENCOUNTER — Other Ambulatory Visit: Payer: Medicaid Other

## 2012-06-13 ENCOUNTER — Encounter: Payer: Medicaid Other | Admitting: Obstetrics and Gynecology

## 2012-07-01 ENCOUNTER — Encounter: Payer: Self-pay | Admitting: Obstetrics and Gynecology

## 2012-07-01 ENCOUNTER — Ambulatory Visit (INDEPENDENT_AMBULATORY_CARE_PROVIDER_SITE_OTHER): Payer: Medicaid Other | Admitting: Obstetrics and Gynecology

## 2012-07-01 VITALS — BP 120/60 | Resp 16 | Wt 140.0 lb

## 2012-07-01 DIAGNOSIS — Z309 Encounter for contraceptive management, unspecified: Secondary | ICD-10-CM

## 2012-07-01 DIAGNOSIS — R109 Unspecified abdominal pain: Secondary | ICD-10-CM

## 2012-07-01 DIAGNOSIS — N949 Unspecified condition associated with female genital organs and menstrual cycle: Secondary | ICD-10-CM

## 2012-07-01 DIAGNOSIS — IMO0001 Reserved for inherently not codable concepts without codable children: Secondary | ICD-10-CM

## 2012-07-01 DIAGNOSIS — N898 Other specified noninflammatory disorders of vagina: Secondary | ICD-10-CM

## 2012-07-01 DIAGNOSIS — R102 Pelvic and perineal pain: Secondary | ICD-10-CM

## 2012-07-01 LAB — POCT WET PREP (WET MOUNT)
Trichomonas Wet Prep HPF POC: NEGATIVE
pH: 4

## 2012-07-01 LAB — POCT URINE PREGNANCY: Preg Test, Ur: NEGATIVE

## 2012-07-01 MED ORDER — ETONOGESTREL-ETHINYL ESTRADIOL 0.12-0.015 MG/24HR VA RING: VAGINAL_RING | VAGINAL | Status: DC

## 2012-07-01 MED ORDER — OXYCODONE-ACETAMINOPHEN 5-325 MG PO TABS: 1.0000 | ORAL_TABLET | ORAL | Status: DC | PRN

## 2012-07-01 NOTE — Progress Notes (Signed)
Color: white Odor: no Itching:no Thin:no Thick:yes Fever:no Dyspareunia:no Hx PID:no HX STD:no Pelvic Pain:yes Desires Gc/CT:no Desires HIV,RPR,HbsAG:no  *c/o pain near c/s incision site  *c/o R side rib pain  *pt wants Nuva Ring

## 2012-07-02 ENCOUNTER — Telehealth: Payer: Self-pay | Admitting: Obstetrics and Gynecology

## 2012-07-02 ENCOUNTER — Encounter: Payer: Self-pay | Admitting: Obstetrics and Gynecology

## 2012-07-02 LAB — CBC WITH DIFFERENTIAL/PLATELET
Basophils Absolute: 0 10*3/uL (ref 0.0–0.1)
Basophils Relative: 0 % (ref 0–1)
HCT: 38.3 % (ref 36.0–46.0)
MCHC: 33.2 g/dL (ref 30.0–36.0)
Monocytes Absolute: 0.3 10*3/uL (ref 0.1–1.0)
Neutro Abs: 1.7 10*3/uL (ref 1.7–7.7)
Neutrophils Relative %: 49 % (ref 43–77)
Platelets: 302 10*3/uL (ref 150–400)
RDW: 15.5 % (ref 11.5–15.5)

## 2012-07-02 LAB — COMPREHENSIVE METABOLIC PANEL
AST: 12 U/L (ref 0–37)
Albumin: 4.4 g/dL (ref 3.5–5.2)
Alkaline Phosphatase: 85 U/L (ref 39–117)
BUN: 6 mg/dL (ref 6–23)
Potassium: 4.1 mEq/L (ref 3.5–5.3)

## 2012-07-02 NOTE — Progress Notes (Signed)
Here for c/o vaginal d/c, persistent incisional and abdominal pain s/p C/S 04/06/12, and right rib pain since delivery.  Also c/o some dizzinesss, mild nausea.  Denies vomiting, fever, d/c, dysuria, drainage from incision, epigastric pain, visual symptoms, or vaginal bleeding at present.  Also wants Nuvaring. Has been taking Ibuprophen, but reports having GI issues from that.  Patient is generally supplementing baby.  Hx remarkable for primary LTCS for FTP, chorioamnionitis, and non-reassuring FHR.  Baby transferred to [facility] on the 1st day of life due to depression and possible need for ECMO, also had unexpected cleft lip.  Mother also transferred to [facility] on day 1 to accompany her baby. Baby now home, but continuing to be evaluated post-NICU for cardiac issues and neurological status.  Is with mother and partner today.  Post-operative course complicated by: Endometritis while at [facility] Postpartum pre-eclampsia after returning to [facility] (approx 3 weeks after delivery).  Remains on Procardia 30 mg XL. Evaluation for DVT after delivery at [facility] evaluation PP depression--prescribed Zoloft at pp visit 9/19, but elected not to continue due to dizziness.  Sees therapist at times.  General hx remarkable for: Fibromyalgia Chronic pelvic pain Depression/anxiety--sees therapist Hyperthyroidism, but no recent/current meds IBS Migraines Fibroids in past, but not noted during pregnancy Interstitial cystitis  PE: VSS, afebrile. Chest clear Heart RRR without murmur Abd--mild tenderness over LTCS incision and just above, but negative for rebound or guarding.  Incision well-healed, no drainage. Point tenderness over rib on right side.  No RUQ abdominal pain with palpation. Pelvic--small amount vaginal discharge, cervix closed, NT.  Uterus well-involuted, mild tenderness to palpation.  No irregularities of shape noted. Adnexa--mild tenderness, no masses palpated. Ext WNL  Wet prep  negative Cultures done  Assessment: Persistent pelvic pain s/p C/S 04/06/12 Hx chronic pelvic pain and multiple pain co-diagnoses Depression Hx pp pre-eclampsia--on Procardia still  Plan: Will refer to MD for further assessment of issues and determination of plan of care. Stop Procardia at present to evaluate if that will help dizziness. CBC, diff, CMP, amylase, lipase (although I doubt gallbladder/pancreas issue). Pelvic US Cultures done Rx Percocet (previously thought she was allergic, due to mild itching when first took it in hospital, but has taken since without issue). Recommended patient continue to connect with therapist. Support to patient for continued issues and concerns regarding baby. Nuvaring 2 samples given, Rx sent.

## 2012-07-02 NOTE — Telephone Encounter (Signed)
Message copied by Mason Jim on Wed Jul 02, 2012 12:19 PM ------      Message from: Cornelius Moras      Created: Wed Jul 02, 2012  8:34 AM       See note.

## 2012-07-02 NOTE — Telephone Encounter (Signed)
Message copied by Mason Jim on Wed Jul 02, 2012 12:21 PM ------      Message from: Cornelius Moras      Created: Wed Jul 02, 2012  8:34 AM       See note.

## 2012-07-02 NOTE — Telephone Encounter (Signed)
TC to pt.  Per VL, informed lab results normal. To keep F/U appt as scheduled with Dr SR. Pt verbalizes comprehension.

## 2012-07-03 ENCOUNTER — Telehealth: Payer: Self-pay

## 2012-07-03 LAB — GC/CHLAMYDIA PROBE AMP
CT Probe RNA: NEGATIVE
GC Probe RNA: NEGATIVE

## 2012-07-03 NOTE — Telephone Encounter (Signed)
Pt needs appointment with AR not SR. AR doesn't have any openings for pt's issue; however, will consult with JO to get this pt scheduled.

## 2012-07-04 ENCOUNTER — Telehealth: Payer: Self-pay

## 2012-07-04 NOTE — Telephone Encounter (Signed)
Spoke to pt to get her appt changed to 07/11/2012 with AR after u/s for pelvic pain instead of the 7th with SR. Melody Comas A

## 2012-07-10 ENCOUNTER — Other Ambulatory Visit: Payer: Medicaid Other

## 2012-07-10 ENCOUNTER — Encounter: Payer: Medicaid Other | Admitting: Obstetrics and Gynecology

## 2012-07-11 ENCOUNTER — Encounter: Payer: Self-pay | Admitting: Obstetrics and Gynecology

## 2012-07-11 ENCOUNTER — Other Ambulatory Visit: Payer: Self-pay

## 2012-09-28 ENCOUNTER — Inpatient Hospital Stay (HOSPITAL_COMMUNITY)
Admission: AD | Admit: 2012-09-28 | Discharge: 2012-09-28 | Disposition: A | Discharge status: Home / Self Care | Payer: Self-pay | Source: Ambulatory Visit | Attending: Obstetrics and Gynecology | Admitting: Obstetrics and Gynecology

## 2012-09-28 DIAGNOSIS — F329 Major depressive disorder, single episode, unspecified: Secondary | ICD-10-CM | POA: Insufficient documentation

## 2012-09-28 DIAGNOSIS — R110 Nausea: Secondary | ICD-10-CM | POA: Insufficient documentation

## 2012-09-28 DIAGNOSIS — F3289 Other specified depressive episodes: Secondary | ICD-10-CM | POA: Insufficient documentation

## 2012-09-28 DIAGNOSIS — N949 Unspecified condition associated with female genital organs and menstrual cycle: Secondary | ICD-10-CM | POA: Insufficient documentation

## 2012-09-28 DIAGNOSIS — F411 Generalized anxiety disorder: Secondary | ICD-10-CM | POA: Insufficient documentation

## 2012-09-28 DIAGNOSIS — R51 Headache: Secondary | ICD-10-CM | POA: Insufficient documentation

## 2012-09-28 LAB — CBC WITH DIFFERENTIAL/PLATELET
Basophils Relative: 0 % (ref 0–1)
Eosinophils Absolute: 0.1 10*3/uL (ref 0.0–0.7)
Hemoglobin: 11.7 g/dL — ABNORMAL LOW (ref 12.0–15.0)
Lymphs Abs: 2 10*3/uL (ref 0.7–4.0)
MCH: 29.5 pg (ref 26.0–34.0)
Neutro Abs: 1.2 10*3/uL — ABNORMAL LOW (ref 1.7–7.7)
Neutrophils Relative %: 32 % — ABNORMAL LOW (ref 43–77)
Platelets: 265 10*3/uL (ref 150–400)
RBC: 3.97 MIL/uL (ref 3.87–5.11)
WBC: 3.8 10*3/uL — ABNORMAL LOW (ref 4.0–10.5)

## 2012-09-28 LAB — COMPREHENSIVE METABOLIC PANEL
ALT: 9 U/L (ref 0–35)
Albumin: 3.7 g/dL (ref 3.5–5.2)
Alkaline Phosphatase: 65 U/L (ref 39–117)
Chloride: 101 mEq/L (ref 96–112)
Glucose, Bld: 88 mg/dL (ref 70–99)
Potassium: 4.5 mEq/L (ref 3.5–5.1)
Sodium: 136 mEq/L (ref 135–145)
Total Bilirubin: 0.3 mg/dL (ref 0.3–1.2)
Total Protein: 7 g/dL (ref 6.0–8.3)

## 2012-09-28 LAB — URINALYSIS, ROUTINE W REFLEX MICROSCOPIC
Bilirubin Urine: NEGATIVE
Ketones, ur: NEGATIVE mg/dL
Nitrite: NEGATIVE
Urobilinogen, UA: 0.2 mg/dL (ref 0.0–1.0)
pH: 6 (ref 5.0–8.0)

## 2012-09-28 MED ORDER — ONDANSETRON HCL 4 MG/2ML IJ SOLN
4.0000 mg | Freq: Once | INTRAMUSCULAR | Status: AC
  Administered 2012-09-28: 4 mg via INTRAVENOUS
  Filled 2012-09-28: qty 2

## 2012-09-28 MED ORDER — HYDROCODONE-ACETAMINOPHEN 5-325 MG PO TABS: 1.0000 | ORAL_TABLET | Freq: Four times a day (QID) | ORAL | Status: DC | PRN

## 2012-09-28 MED ORDER — PROMETHAZINE HCL 12.5 MG PO TABS: 25.0000 mg | ORAL_TABLET | Freq: Four times a day (QID) | ORAL | Status: DC | PRN

## 2012-09-28 MED ORDER — BUTORPHANOL TARTRATE 1 MG/ML IJ SOLN
1.0000 mg | Freq: Once | INTRAMUSCULAR | Status: AC
  Administered 2012-09-28: 1 mg via INTRAVENOUS
  Filled 2012-09-28: qty 1

## 2012-09-28 MED ORDER — LACTATED RINGERS IV SOLN: INTRAVENOUS | Status: DC

## 2012-09-28 MED ORDER — LACTATED RINGERS IV BOLUS (SEPSIS)
500.0000 mL | Freq: Once | INTRAVENOUS | Status: AC
  Administered 2012-09-28: 22:00:00 via INTRAVENOUS

## 2012-09-28 NOTE — MAU Note (Addendum)
Patient presents to MAU with c/o nausea and dizziness, weakness, h/a x 3 days. Patient was on BP meds until October for postpartum high BPs;  Reports she was supposed to follow up in November but did not.  No vaginal bleeding, discharge. Reports she took 2 Excedrin Tension tablets at 1500 today for h/a. Denies fever, chills.

## 2012-09-28 NOTE — MAU Provider Note (Signed)
History   32 yo G1P1001 presented unannounced c/o generally feeling bad, with nausea, dizziness, fatigue, muscle/joint pain, and HA.  Issues have been present for several weeks, with mild HA now x 3 days.  Also reports sporadic occurrence of palpitations, none at present.  Denies vomiting, diarrhea, fever, or dysuria.   Has been seen at [facility] in October for chronic pelvic pain, then had several missed appts due to loss of insurance.  Had primary LTCS 04/06/12 for NRFHR, FTP, and chorio, with pp course complicated by pre-eclampsia, pp endometritis, and pp depression.  She had venous doppler studies to r/o DVT.  Infant was transferred to [facility] on 1st day of life due to pulmonary hypertension, with patient transferred to [facility] then as well.  Baby also had unexpected cleft lip.  Infant was at [facility] for 10 days, then transferred to Peds at [facility] x 1 month.  Just had cleft lip repaired, and is doing well.  Patient has struggled with anxiety and depression pp--now feels depression is much better, still has some anxiety but improved since baby's surgery is completed.  Long hx of pelvic pain, fibromyalgia, chronic pain issues, and depression in past.  Previous Cymbalta use before moving to [place], but felt it didn't help.  Has not seen any primary provider for fibromyalgia in the last year.  Not using any birth control other than withdrawal, due to issues with dysfunctional bleeding on Nuvaring and OCPs--declines Depo, Nexplanon, and IUD.  Has applied for disability--is anxious about this process, as well.  Upon chart review, had TSH of 0.289 on 04/28/12, but normal free T4 and T3 uptake.  Patient Active Problem List  Diagnosis  . Chronic pelvic pain in female  . Interstitial cystitis  . Dysmenorrhea  . IBS (irritable bowel syndrome)  . Panic attacks  . Depression  . Fibromyalgia  . Migraine headache  . Fibroids  . Preterm uterine contractions, antepartum  . Umbilical hernia  . Hyperthyroidism  complicating pregnancy  . Pregnant state, incidental  . Status post primary low transverse cesarean section  . Postpartum pre-eclampsia  . Endometritis following delivery  . Postpartum depression     Chief Complaint  Patient presents with  . Abdominal Pain  . Nausea  . Back Pain     OB History    Grav Para Term Preterm Abortions TAB SAB Ect Mult Living   1 1 1  0 0 0 0 0 0 1      Past Medical History  Diagnosis Date  . Neuromuscular disorder     fibromyalagia  . Back pain   . Gastritis   . Interstitial cystitis   . Endometrial cyst of ovary   . Anxiety     pt states she experiences panic attacks  . GERD (gastroesophageal reflux disease)   . Migraine   . Hernia, hiatal   . Fibroid   . Panic attack   . Panic attacks 01/09/2012  . Fibromyalgia   . Urinary tract infection   . Fibromyalgia   . PID (pelvic inflammatory disease)   . Trichomonas   . Depression     seeing a therapist  . Preterm labor     preterm contractions, delivered full term    Past Surgical History  Procedure Date  . Laparoscopy 06/18/2011    Procedure: LAPAROSCOPY DIAGNOSTIC;  Surgeon: Hollie Salk C. Marice Potter, MD;  Location: [facility];  Service: Gynecology;  Laterality: Bilateral;  with bilateral chromopertubation with excision of endometrial biopsy  . Laparoscopic endometriosis fulguration 06/2011  .  Cesarean section 04/06/2012    Procedure: CESAREAN SECTION;  Surgeon: Purcell Nails, MD;  Location: [facility];  Service: Gynecology;  Laterality: N/A;  Primary cesarean section of baby boy  at 56     Family History  Problem Relation Age of Onset  . Fibroids Mother   . Hypertension Mother   . Hypertension Father   . Diabetes Father   . Kidney disease Father     kidney stones  . Gout Father   . Anesthesia problems Neg Hx   . Other Neg Hx     History  Substance Use Topics  . Smoking status: Never Smoker   . Smokeless tobacco: Never Used  . Alcohol Use: No    Allergies:  Allergies  Allergen  Reactions  . Sertraline Other (See Comments)    Dizzy, headache, felt light-headed    Prescriptions prior to admission  Medication Sig Dispense Refill  . aspirin-acetaminophen-caffeine (EXCEDRIN MIGRAINE) 250-250-65 MG per tablet Take 2 tablets by mouth every 6 (six) hours as needed. headaches      . oxyCODONE-acetaminophen (PERCOCET) 5-325 MG per tablet Take 1 tablet by mouth every 4 (four) hours as needed for pain.  36 tablet  0  . ranitidine (ZANTAC) 150 MG capsule Take 150 mg by mouth daily as needed. heartburn      . etonogestrel-ethinyl estradiol (NUVARING) 0.12-0.015 MG/24HR vaginal ring Insert vaginally and leave in place for 3 consecutive weeks, then remove for 1 week.  1 each  11     Physical Exam   Blood pressure 128/88, pulse 93, temperature 98.3 F (36.8 C), temperature source Oral, resp. rate 18, height 5' 6.5" (1.689 m), weight 138 lb (62.596 kg), last menstrual period 08/22/2012, SpO2 100.00%.  Appears in NAD, with somewhat depressed affect, but same as usual affect on previous visits Chest clear Heart RRR without murmur, arrythmia, or other issues Abd soft, NT Pelvic--deferred Ext WNL  UPT negative UA negative  ED Course  Multiple issues--nausea, dizziness, HA, muscle/joint pain Hx chronic pain conditions Anxiety/depression  Plan: Reviewed issues with patient--needs primary evaluation of issues to ensure complete attention to chronic pain conditions, etc. Will plan bag of IV fluids, Zofran in MAU Check CBC, diff, CMP, and TSH, free T4, and T3 uptake due to past hx of low TSH. Needs referral to primary MD for f/u of issues.    Nigel Bridgeman CNM, [place] 09/28/2012 9:45 PM   Addendum: Results for orders placed during the hospital encounter of 09/28/12 (from the past 24 hour(s))  URINALYSIS, ROUTINE W REFLEX MICROSCOPIC     Status: Normal   Collection Time   09/28/12  8:04 PM      Component Value Range   Color, Urine YELLOW  YELLOW   APPearance CLEAR  CLEAR     Specific Gravity, Urine 1.025  1.005 - 1.030   pH 6.0  5.0 - 8.0   Glucose, UA NEGATIVE  NEGATIVE mg/dL   Hgb urine dipstick NEGATIVE  NEGATIVE   Bilirubin Urine NEGATIVE  NEGATIVE   Ketones, ur NEGATIVE  NEGATIVE mg/dL   Protein, ur NEGATIVE  NEGATIVE mg/dL   Urobilinogen, UA 0.2  0.0 - 1.0 mg/dL   Nitrite NEGATIVE  NEGATIVE   Leukocytes, UA NEGATIVE  NEGATIVE  POCT PREGNANCY, URINE     Status: Normal   Collection Time   09/28/12  8:10 PM      Component Value Range   Preg Test, Ur NEGATIVE  NEGATIVE  CBC WITH DIFFERENTIAL  Status: Abnormal   Collection Time   09/28/12  8:56 PM      Component Value Range   WBC 3.8 (*) 4.0 - 10.5 K/uL   RBC 3.97  3.87 - 5.11 MIL/uL   Hemoglobin 11.7 (*) 12.0 - 15.0 g/dL   HCT 40.9 (*) 81.1 - 91.4 %   MCV 87.2  78.0 - 100.0 fL   MCH 29.5  26.0 - 34.0 pg   MCHC 33.8  30.0 - 36.0 g/dL   RDW 78.2  95.6 - 21.3 %   Platelets 265  150 - 400 K/uL   Neutrophils Relative 32 (*) 43 - 77 %   Neutro Abs 1.2 (*) 1.7 - 7.7 K/uL   Lymphocytes Relative 54 (*) 12 - 46 %   Lymphs Abs 2.0  0.7 - 4.0 K/uL   Monocytes Relative 12  3 - 12 %   Monocytes Absolute 0.5  0.1 - 1.0 K/uL   Eosinophils Relative 1  0 - 5 %   Eosinophils Absolute 0.1  0.0 - 0.7 K/uL   Basophils Relative 0  0 - 1 %   Basophils Absolute 0.0  0.0 - 0.1 K/uL  COMPREHENSIVE METABOLIC PANEL     Status: Normal   Collection Time   09/28/12  8:56 PM      Component Value Range   Sodium 136  135 - 145 mEq/L   Potassium 4.5  3.5 - 5.1 mEq/L   Chloride 101  96 - 112 mEq/L   CO2 25  19 - 32 mEq/L   Glucose, Bld 88  70 - 99 mg/dL   BUN 8  6 - 23 mg/dL   Creatinine, Ser 0.86  0.50 - 1.10 mg/dL   Calcium 8.9  8.4 - 57.8 mg/dL   Total Protein 7.0  6.0 - 8.3 g/dL   Albumin 3.7  3.5 - 5.2 g/dL   AST 12  0 - 37 U/L   ALT 9  0 - 35 U/L   Alkaline Phosphatase 65  39 - 117 U/L   Total Bilirubin 0.3  0.3 - 1.2 mg/dL   GFR calc non Af Amer >90  >90 mL/min   GFR calc Af Amer >90  >90 mL/min    Feeling better after IV hydration, Zofran, and Stadol (given for HA and back pain). D/C'd home with Rx for Vicodin and Phenergan. May use Zofran that she has at home (watch for constipation). Office will f/u with patient regarding thyroid testing and referral to primary.  Nigel Bridgeman, CNM 09/28/12 11:45p

## 2012-09-28 NOTE — MAU Note (Signed)
Pt reports dizziness, nausea, back pain, abd pain, and feels "like i am going to pass out". Symptoms x 2 days. Reports generalized abd pain.

## 2012-09-30 ENCOUNTER — Telehealth: Payer: Self-pay | Admitting: Obstetrics and Gynecology

## 2012-09-30 NOTE — Telephone Encounter (Signed)
Tc to pt regarding msg below.  Pt informed of resource per [facility] for primary care "[facility]" [phone]),  Also informed pt that thyroid lab was WNL, pt voices agreement to all.

## 2012-09-30 NOTE — Telephone Encounter (Signed)
Message copied by Delon Sacramento on Tue Sep 30, 2012  9:47 AM ------      Message from: Cornelius Moras      Created: Sun Sep 28, 2012 11:46 PM      Regarding: Referral to primary       Please check with [facility] regarding referral options for patients with no insurance...Marland KitchenMarland Kitchenthis patient has multiple medical issues.      Fibromyalgia, IBS, depression/anxiety, interstitial cystitis, etc.....that need f/u.      Thanks!      VL

## 2012-11-04 DIAGNOSIS — Z8669 Personal history of other diseases of the nervous system and sense organs: Secondary | ICD-10-CM | POA: Insufficient documentation

## 2012-11-04 DIAGNOSIS — Z8739 Personal history of other diseases of the musculoskeletal system and connective tissue: Secondary | ICD-10-CM | POA: Insufficient documentation

## 2012-11-04 DIAGNOSIS — Z8619 Personal history of other infectious and parasitic diseases: Secondary | ICD-10-CM | POA: Insufficient documentation

## 2012-11-04 DIAGNOSIS — K219 Gastro-esophageal reflux disease without esophagitis: Secondary | ICD-10-CM | POA: Insufficient documentation

## 2012-11-04 DIAGNOSIS — Z8742 Personal history of other diseases of the female genital tract: Secondary | ICD-10-CM | POA: Insufficient documentation

## 2012-11-04 DIAGNOSIS — Z8751 Personal history of pre-term labor: Secondary | ICD-10-CM | POA: Insufficient documentation

## 2012-11-04 DIAGNOSIS — Z87440 Personal history of urinary (tract) infections: Secondary | ICD-10-CM | POA: Insufficient documentation

## 2012-11-04 DIAGNOSIS — Z87448 Personal history of other diseases of urinary system: Secondary | ICD-10-CM | POA: Insufficient documentation

## 2012-11-04 DIAGNOSIS — R110 Nausea: Secondary | ICD-10-CM | POA: Insufficient documentation

## 2012-11-04 DIAGNOSIS — Z3202 Encounter for pregnancy test, result negative: Secondary | ICD-10-CM | POA: Insufficient documentation

## 2012-11-04 DIAGNOSIS — R1084 Generalized abdominal pain: Secondary | ICD-10-CM | POA: Insufficient documentation

## 2012-11-04 DIAGNOSIS — Z8659 Personal history of other mental and behavioral disorders: Secondary | ICD-10-CM | POA: Insufficient documentation

## 2012-11-04 DIAGNOSIS — G8929 Other chronic pain: Secondary | ICD-10-CM | POA: Insufficient documentation

## 2012-11-04 DIAGNOSIS — R42 Dizziness and giddiness: Secondary | ICD-10-CM | POA: Insufficient documentation

## 2012-11-04 DIAGNOSIS — Z79899 Other long term (current) drug therapy: Secondary | ICD-10-CM | POA: Insufficient documentation

## 2012-11-04 DIAGNOSIS — Z8719 Personal history of other diseases of the digestive system: Secondary | ICD-10-CM | POA: Insufficient documentation

## 2012-11-04 LAB — CBC WITH DIFFERENTIAL/PLATELET
Eosinophils Relative: 1 % (ref 0–5)
HCT: 32.5 % — ABNORMAL LOW (ref 36.0–46.0)
Lymphocytes Relative: 57 % — ABNORMAL HIGH (ref 12–46)
Lymphs Abs: 2.1 10*3/uL (ref 0.7–4.0)
MCH: 28.9 pg (ref 26.0–34.0)
MCV: 84 fL (ref 78.0–100.0)
Monocytes Absolute: 0.2 10*3/uL (ref 0.1–1.0)
Monocytes Relative: 6 % (ref 3–12)
RBC: 3.87 MIL/uL (ref 3.87–5.11)
WBC: 3.6 10*3/uL — ABNORMAL LOW (ref 4.0–10.5)

## 2012-11-04 LAB — POCT I-STAT, CHEM 8
Calcium, Ion: 1.17 mmol/L (ref 1.12–1.23)
Chloride: 102 mEq/L (ref 96–112)
Glucose, Bld: 95 mg/dL (ref 70–99)
HCT: 35 % — ABNORMAL LOW (ref 36.0–46.0)

## 2012-11-04 LAB — URINALYSIS, ROUTINE W REFLEX MICROSCOPIC
Glucose, UA: NEGATIVE mg/dL
Ketones, ur: NEGATIVE mg/dL
Leukocytes, UA: NEGATIVE
Protein, ur: NEGATIVE mg/dL

## 2012-11-04 NOTE — ED Notes (Signed)
Just came off LMP

## 2012-11-04 NOTE — ED Notes (Signed)
Pt states she felt sick today but some of her symptoms have been intermittent x 3 weeks. Dizziness, abdominal pain cramping, N/V/D, back pain, HA (migraine, sharp pain).

## 2012-11-05 ENCOUNTER — Emergency Department (HOSPITAL_COMMUNITY)
Admission: EM | Admit: 2012-11-05 | Discharge: 2012-11-05 | Disposition: A | Discharge status: Home / Self Care | Payer: Self-pay | Attending: Emergency Medicine | Admitting: Emergency Medicine

## 2012-11-05 ENCOUNTER — Encounter (HOSPITAL_COMMUNITY): Payer: Self-pay | Admitting: Emergency Medicine

## 2012-11-05 LAB — LIPASE, BLOOD: Lipase: 34 U/L (ref 11–59)

## 2012-11-05 LAB — COMPREHENSIVE METABOLIC PANEL
ALT: 8 U/L (ref 0–35)
BUN: 13 mg/dL (ref 6–23)
CO2: 28 mEq/L (ref 19–32)
Calcium: 9.6 mg/dL (ref 8.4–10.5)
Creatinine, Ser: 0.81 mg/dL (ref 0.50–1.10)
GFR calc Af Amer: >90 mL/min (ref >90)
GFR calc non Af Amer: >90 mL/min (ref >90)
Glucose, Bld: 95 mg/dL (ref 70–99)

## 2012-11-05 LAB — AMYLASE: Amylase: 99 U/L (ref 0–105)

## 2012-11-05 MED ORDER — HYOSCYAMINE 0.15 MG PO TABS: 0.1500 mg | ORAL_TABLET | ORAL | Status: DC | PRN

## 2012-11-05 NOTE — ED Notes (Addendum)
Pt stated she started experiencing abdominal pain about 3 weeks ago.  Today is at it's worse.  She describes it as a weird sensation like she's going to pass out.  She is also experiencing nausea and dizziness.

## 2012-11-05 NOTE — ED Notes (Signed)
Went over discharge instruction with patient.  Discussed follow up care (call and set up and appt with Dr. Isabel Caprice in AM and set up an appt to see him ASAP) and prescription (take pain medication as instructed) instruction with patient.

## 2012-11-05 NOTE — ED Provider Notes (Signed)
History     CSN: [id number]  Arrival date & time 11/04/12  2314   First MD Initiated Contact with Patient 11/05/12 0140      Chief Complaint  Patient presents with  . Abdominal Pain    (Consider location/radiation/quality/duration/timing/severity/associated sxs/prior treatment) HPI Comments: Patient with chronic abdominal pain , interstitial cystitis, IBS, has had more frequent episodes of abdominal cramping that lasts for 1-2 hours than spontaneously resolves and is followed by a loose urgent BM She has not seen her PCP, OB/GYN in the past 3 weeks At this time is relatively comfortable  Patient is a 32 y.o. female presenting with abdominal pain. The history is provided by the patient.  Abdominal Pain Pain location:  Generalized Pain quality: cramping   Pain radiates to:  RUQ Pain severity:  Mild Onset quality:  Unable to specify Duration:  3 weeks Progression:  Waxing and waning Chronicity:  Chronic Relieved by:  Nothing Worsened by:  Vomiting and bowel movements Associated symptoms: no chills, no diarrhea, no dysuria, no fever, no nausea, no vaginal bleeding, no vaginal discharge and no vomiting     Past Medical History  Diagnosis Date  . Neuromuscular disorder     fibromyalagia  . Back pain   . Gastritis   . Interstitial cystitis   . Endometrial cyst of ovary   . Anxiety     pt states she experiences panic attacks  . GERD (gastroesophageal reflux disease)   . Migraine   . Hernia, hiatal   . Fibroid   . Panic attack   . Panic attacks 01/09/2012  . Fibromyalgia   . Urinary tract infection   . Fibromyalgia   . PID (pelvic inflammatory disease)   . Trichomonas   . Depression     seeing a therapist  . Preterm labor     preterm contractions, delivered full term    Past Surgical History  Procedure Laterality Date  . Laparoscopy  06/18/2011    Procedure: LAPAROSCOPY DIAGNOSTIC;  Surgeon: Hollie Salk C. Marice Potter, MD;  Location: [facility];  Service: Gynecology;  Laterality:  Bilateral;  with bilateral chromopertubation with excision of endometrial biopsy  . Laparoscopic endometriosis fulguration  06/2011  . Cesarean section  04/06/2012    Procedure: CESAREAN SECTION;  Surgeon: Purcell Nails, MD;  Location: [facility];  Service: Gynecology;  Laterality: N/A;  Primary cesarean section of baby boy  at 80     Family History  Problem Relation Age of Onset  . Fibroids Mother   . Hypertension Mother   . Hypertension Father   . Diabetes Father   . Kidney disease Father     kidney stones  . Gout Father   . Anesthesia problems Neg Hx   . Other Neg Hx     History  Substance Use Topics  . Smoking status: Never Smoker   . Smokeless tobacco: Never Used  . Alcohol Use: No    OB History   Grav Para Term Preterm Abortions TAB SAB Ect Mult Living   1 1 1  0 0 0 0 0 0 1      Review of Systems  Constitutional: Negative for fever and chills.  Gastrointestinal: Positive for abdominal pain. Negative for nausea, vomiting, diarrhea and abdominal distention.  Genitourinary: Negative for dysuria, urgency, frequency, flank pain, vaginal bleeding, vaginal discharge and pelvic pain.  Musculoskeletal: Negative for back pain.  Skin: Negative for rash.  Neurological: Negative for weakness.  All other systems reviewed and are negative.  Allergies  Sertraline  Home Medications   Current Outpatient Rx  Name  Route  Sig  Dispense  Refill  . aspirin-acetaminophen-caffeine (EXCEDRIN MIGRAINE) 250-250-65 MG per tablet   Oral   Take 2 tablets by mouth every 6 (six) hours as needed. headaches         . HYDROcodone-acetaminophen (NORCO/VICODIN) 5-325 MG per tablet   Oral   Take 1 tablet by mouth every 6 (six) hours as needed for pain.   30 tablet   0   . EXPIRED: promethazine (PHENERGAN) 12.5 MG tablet   Oral   Take 25 mg by mouth every 6 (six) hours as needed. For nausea         . promethazine (PHENERGAN) 12.5 MG tablet   Oral   Take 2 tablets (25 mg total)  by mouth every 6 (six) hours as needed for nausea.   30 tablet   0   . EXPIRED: promethazine (PHENERGAN) 25 MG tablet   Oral   Take 1 tablet (25 mg total) by mouth every 6 (six) hours as needed for nausea.   60 tablet   0   . ranitidine (ZANTAC) 150 MG capsule   Oral   Take 150 mg by mouth daily as needed. heartburn           BP 132/78  Pulse 79  Temp(Src) 98.1 F (36.7 C) (Oral)  Resp 12  SpO2 100%  Physical Exam  Constitutional: She appears well-developed and well-nourished.  HENT:  Head: Normocephalic.  Eyes: Pupils are equal, round, and reactive to light.  Neck: Normal range of motion.  Cardiovascular: Regular rhythm.   Pulmonary/Chest: Effort normal and breath sounds normal.  Abdominal: Soft. Bowel sounds are normal. She exhibits no distension. There is no hepatosplenomegaly. There is tenderness in the right upper quadrant. There is no rigidity, no guarding and negative Murphy's sign.  Mild RUQ pain without Murphy's signs     ED Course  Procedures (including critical care time)  Labs Reviewed  CBC WITH DIFFERENTIAL - Abnormal; Notable for the following:    WBC 3.6 (*)    Hemoglobin 11.2 (*)    HCT 32.5 (*)    Neutrophils Relative 36 (*)    Neutro Abs 1.3 (*)    Lymphocytes Relative 57 (*)    All other components within normal limits  URINALYSIS, ROUTINE W REFLEX MICROSCOPIC - Abnormal; Notable for the following:    APPearance CLOUDY (*)    All other components within normal limits  POCT I-STAT, CHEM 8 - Abnormal; Notable for the following:    Hemoglobin 11.9 (*)    HCT 35.0 (*)    All other components within normal limits  COMPREHENSIVE METABOLIC PANEL  AMYLASE  LIPASE, BLOOD  POCT PREGNANCY, URINE   No results found.   No diagnosis found.    MDM  Discussed at length lab results and need for consistant follow up   Will Rx anti spasmatic and GI and Urology referrals         Arman Filter, NP 11/05/12 [phone]

## 2012-11-05 NOTE — ED Provider Notes (Signed)
Medical screening examination/treatment/procedure(s) were performed by non-physician practitioner and as supervising physician I was immediately available for consultation/collaboration.  Sunnie Nielsen, MD 11/05/12 [phone]

## 2012-11-05 NOTE — ED Notes (Signed)
Dondra Spry, FNP at bedside.

## 2013-02-07 IMAGING — CT CT HEAD W/O CM
1 of 2 series · 16 of 30 positions shown, 20 images · non-contrast
Comparison: Head CT 09/18/2009

CLINICAL DATA: Chest pain, dizziness

CT HEAD WITHOUT CONTRAST
TECHNIQUE: Contiguous axial images were obtained from the base of
the skull through the vertex without contrast.

[Series 2: head routine 4.8 h37s · axial · 0.45mm/px · z∈[-130,-4]mm · 16 of 30 slices shown, 20 images]
[im 2/30  brain]
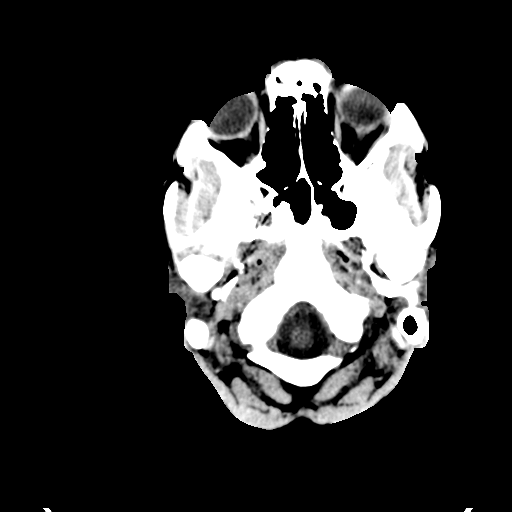
[im 2/30  bone]
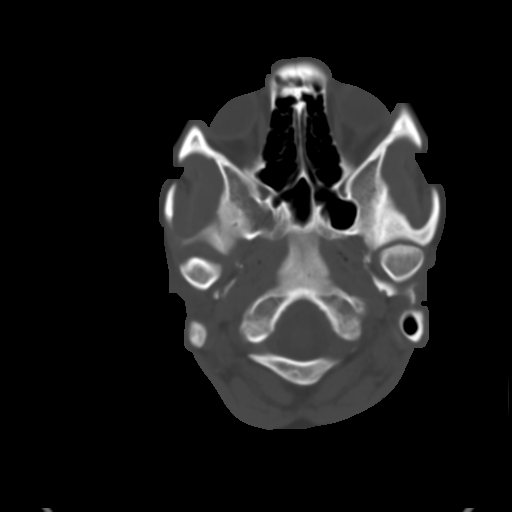
[im 4/30  brain]
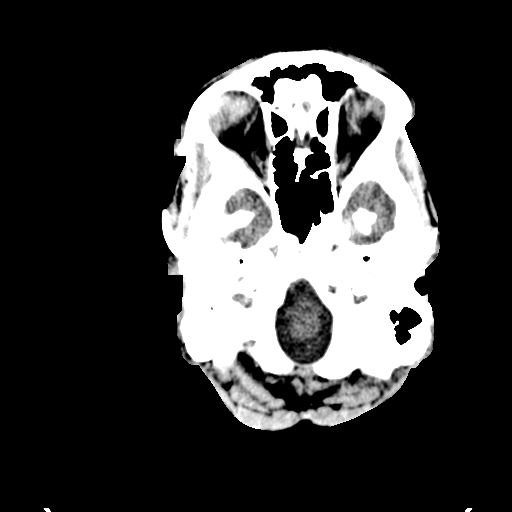
[im 5/30  brain]
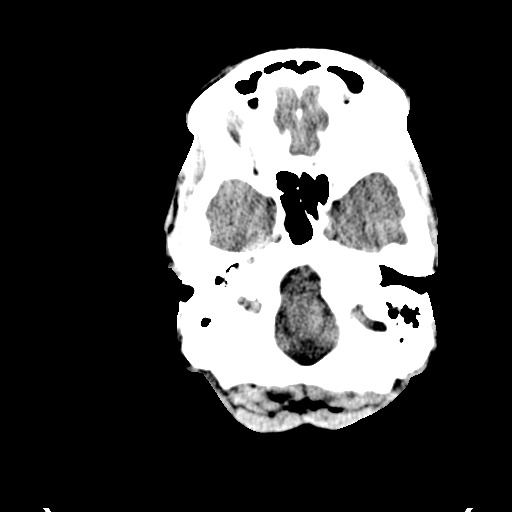
[im 8/30  brain]
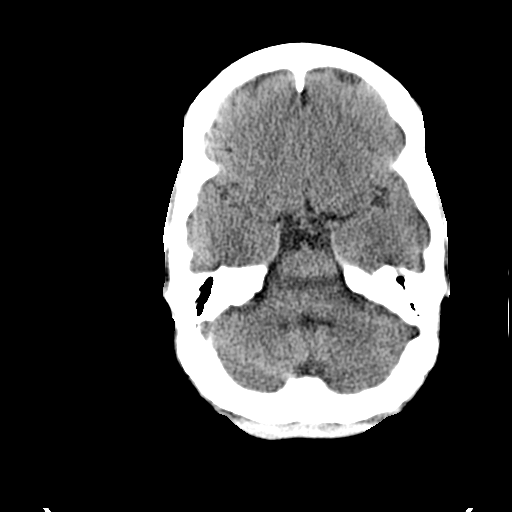
[im 9/30  brain]
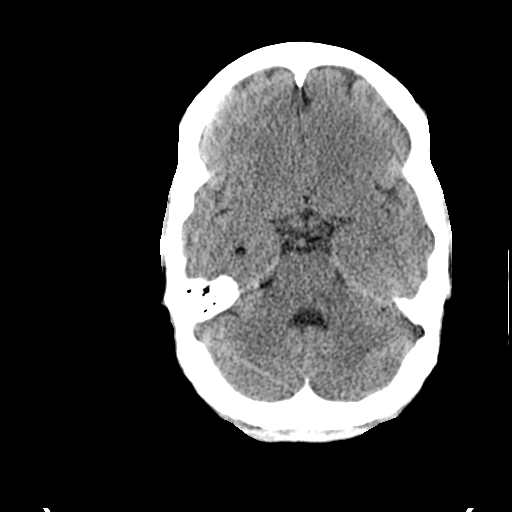
[im 9/30  bone]
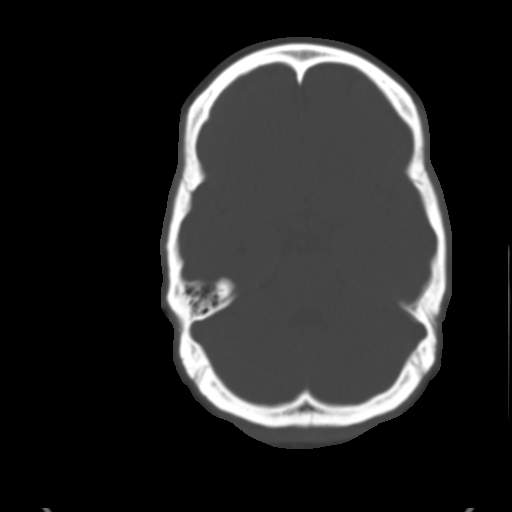
[im 10/30  brain]
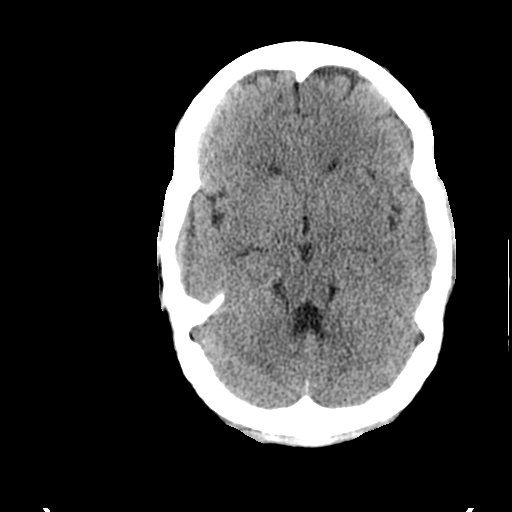
[im 13/30  brain]
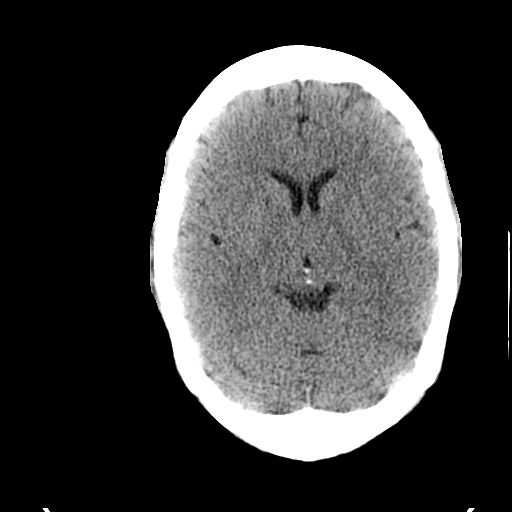
[im 14/30  brain]
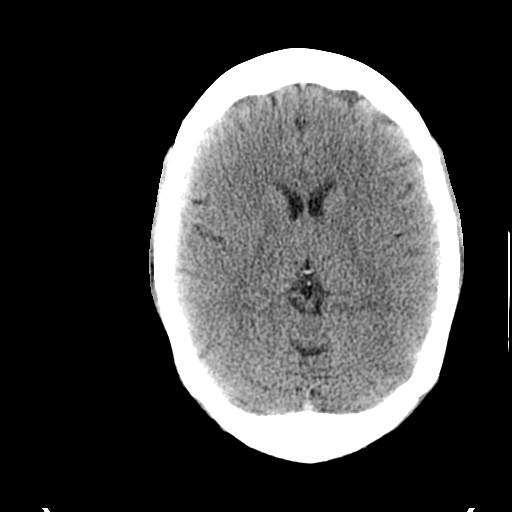
[im 16/30  brain]
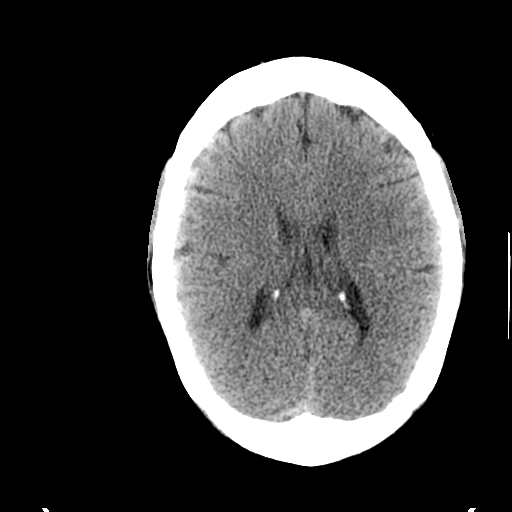
[im 16/30  bone]
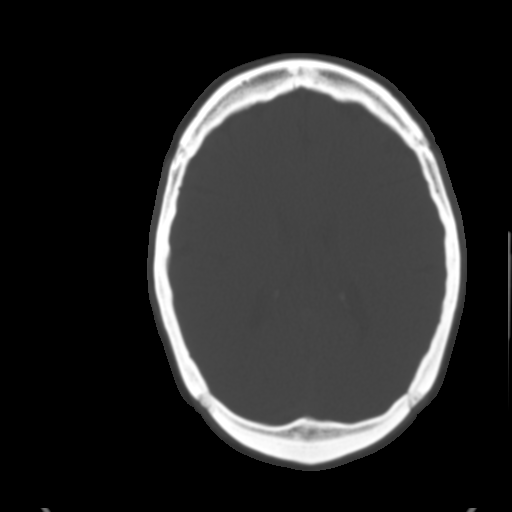
[im 17/30  brain]
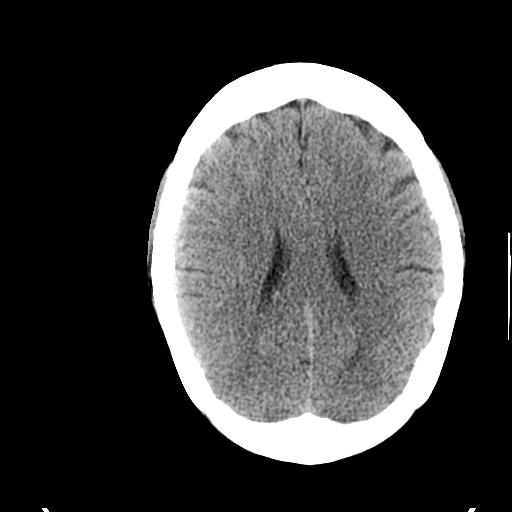
[im 20/30  brain]
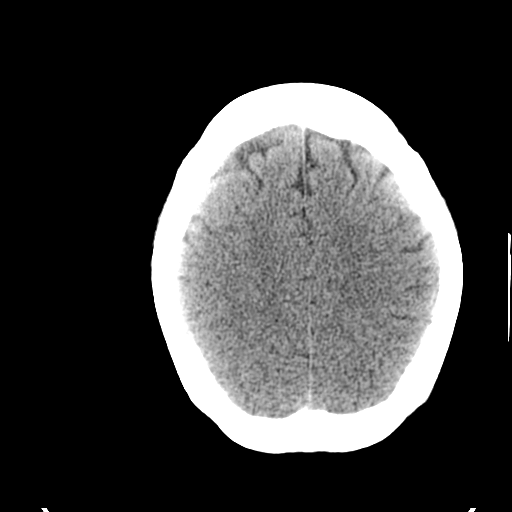
[im 21/30  brain]
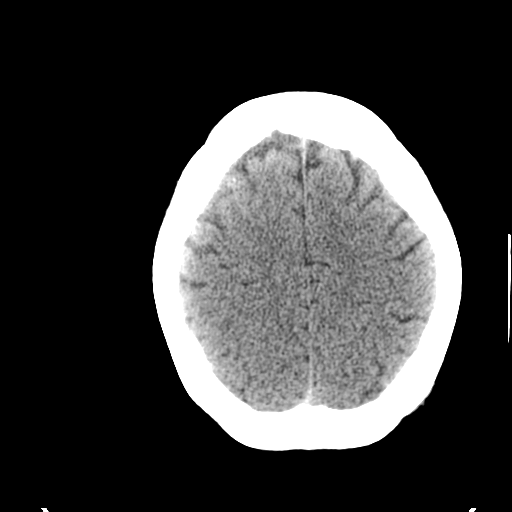
[im 22/30  brain]
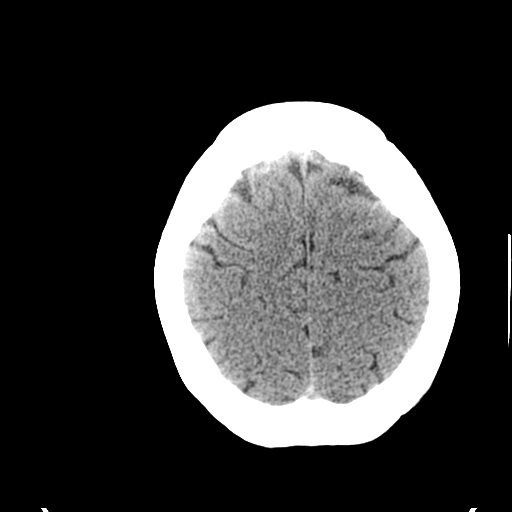
[im 22/30  bone]
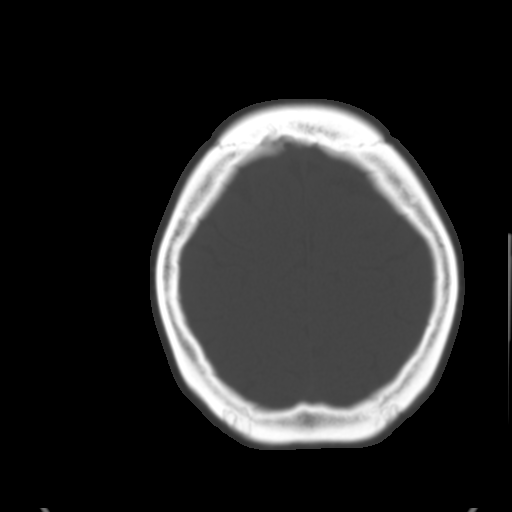
[im 25/30  brain]
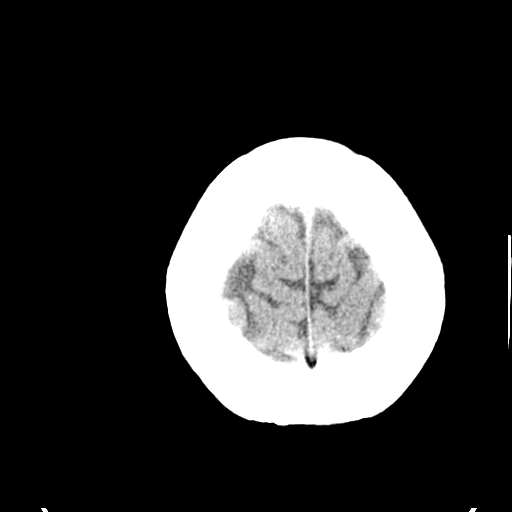
[im 26/30  brain]
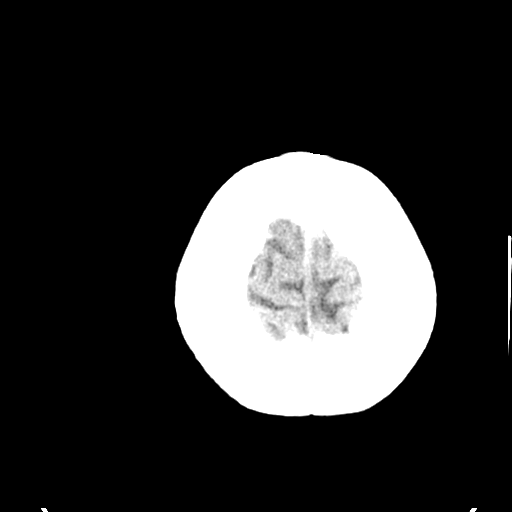
[im 28/30  brain]
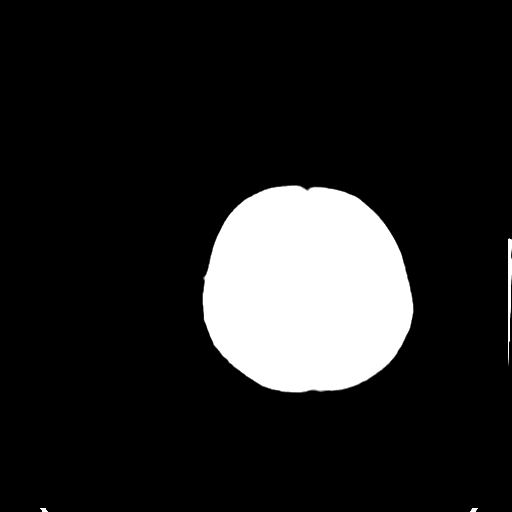

[16 of 30 positions shown; findings below may reference images not displayed]

FINDINGS: No acute intracranial hemorrhage.  No focal mass lesion.
No CT evidence of acute infarction.  No midline shift or mass
effect.  No hydrocephalus.  The basilar cisterns are patent.

Paranasal sinuses and mastoid air cells are clear.  Orbits are
normal.
IMPRESSION: Normal head CT

## 2013-02-18 ENCOUNTER — Encounter (HOSPITAL_COMMUNITY): Payer: Self-pay | Admitting: Family Medicine

## 2013-02-18 ENCOUNTER — Emergency Department (HOSPITAL_COMMUNITY): Payer: Self-pay

## 2013-02-18 ENCOUNTER — Emergency Department (HOSPITAL_COMMUNITY)
Admission: EM | Admit: 2013-02-18 | Discharge: 2013-02-18 | Disposition: A | Discharge status: Home / Self Care | Payer: Self-pay | Attending: Emergency Medicine | Admitting: Emergency Medicine

## 2013-02-18 DIAGNOSIS — Z8669 Personal history of other diseases of the nervous system and sense organs: Secondary | ICD-10-CM | POA: Insufficient documentation

## 2013-02-18 DIAGNOSIS — R059 Cough, unspecified: Secondary | ICD-10-CM | POA: Insufficient documentation

## 2013-02-18 DIAGNOSIS — F3289 Other specified depressive episodes: Secondary | ICD-10-CM | POA: Insufficient documentation

## 2013-02-18 DIAGNOSIS — Z8751 Personal history of pre-term labor: Secondary | ICD-10-CM | POA: Insufficient documentation

## 2013-02-18 DIAGNOSIS — R05 Cough: Secondary | ICD-10-CM | POA: Insufficient documentation

## 2013-02-18 DIAGNOSIS — K219 Gastro-esophageal reflux disease without esophagitis: Secondary | ICD-10-CM | POA: Insufficient documentation

## 2013-02-18 DIAGNOSIS — R5381 Other malaise: Secondary | ICD-10-CM | POA: Insufficient documentation

## 2013-02-18 DIAGNOSIS — Z8739 Personal history of other diseases of the musculoskeletal system and connective tissue: Secondary | ICD-10-CM | POA: Insufficient documentation

## 2013-02-18 DIAGNOSIS — Z8659 Personal history of other mental and behavioral disorders: Secondary | ICD-10-CM | POA: Insufficient documentation

## 2013-02-18 DIAGNOSIS — Z3202 Encounter for pregnancy test, result negative: Secondary | ICD-10-CM | POA: Insufficient documentation

## 2013-02-18 DIAGNOSIS — Z8719 Personal history of other diseases of the digestive system: Secondary | ICD-10-CM | POA: Insufficient documentation

## 2013-02-18 DIAGNOSIS — Z87448 Personal history of other diseases of urinary system: Secondary | ICD-10-CM | POA: Insufficient documentation

## 2013-02-18 DIAGNOSIS — H9209 Otalgia, unspecified ear: Secondary | ICD-10-CM | POA: Insufficient documentation

## 2013-02-18 DIAGNOSIS — Z8742 Personal history of other diseases of the female genital tract: Secondary | ICD-10-CM | POA: Insufficient documentation

## 2013-02-18 DIAGNOSIS — F329 Major depressive disorder, single episode, unspecified: Secondary | ICD-10-CM | POA: Insufficient documentation

## 2013-02-18 DIAGNOSIS — IMO0001 Reserved for inherently not codable concepts without codable children: Secondary | ICD-10-CM | POA: Insufficient documentation

## 2013-02-18 DIAGNOSIS — J019 Acute sinusitis, unspecified: Secondary | ICD-10-CM

## 2013-02-18 DIAGNOSIS — Z8619 Personal history of other infectious and parasitic diseases: Secondary | ICD-10-CM | POA: Insufficient documentation

## 2013-02-18 DIAGNOSIS — Z87440 Personal history of urinary (tract) infections: Secondary | ICD-10-CM | POA: Insufficient documentation

## 2013-02-18 DIAGNOSIS — R51 Headache: Secondary | ICD-10-CM | POA: Insufficient documentation

## 2013-02-18 DIAGNOSIS — J3489 Other specified disorders of nose and nasal sinuses: Secondary | ICD-10-CM | POA: Insufficient documentation

## 2013-02-18 DIAGNOSIS — G43909 Migraine, unspecified, not intractable, without status migrainosus: Secondary | ICD-10-CM | POA: Insufficient documentation

## 2013-02-18 MED ORDER — HYDROCODONE-HOMATROPINE 5-1.5 MG/5ML PO SYRP: 5.0000 mL | ORAL_SOLUTION | Freq: Four times a day (QID) | ORAL | Status: DC | PRN

## 2013-02-18 MED ORDER — FLUTICASONE PROPIONATE 50 MCG/ACT NA SUSP: 2.0000 | Freq: Every day | NASAL | Status: DC | PRN

## 2013-02-18 NOTE — ED Provider Notes (Signed)
History     CSN: [id number]  Arrival date & time 02/18/13  1129   First MD Initiated Contact with Patient 02/18/13 1244      Chief Complaint  Patient presents with  . Sore Throat  . Cough  . Nasal Congestion    (Consider location/radiation/quality/duration/timing/severity/associated sxs/prior treatment) HPI Comments: Patient is a 32 year old female who presents for nasal congestion, rhinorrhea, ear pressure bilaterally, and cough x2 days. Patient states that symptoms have gradually worsened since onset without aggravating factors. Patient cannot recall whether she took Benadryl or Claritin, but states that she received no relief of symptoms. Patient denies fevers, visual disturbances, ear discharge, difficulty swallowing or shortness of breath, chills, and diaphoresis.  Patient is a 32 y.o. female presenting with cough. The history is provided by the patient. No language interpreter was used.  Cough Associated symptoms: ear pain (Pressure bilaterally), headaches (Sinus headache), myalgias and rhinorrhea   Associated symptoms: no chest pain, no chills, no diaphoresis, no fever and no rash     Past Medical History  Diagnosis Date  . Neuromuscular disorder     fibromyalagia  . Back pain   . Gastritis   . Interstitial cystitis   . Endometrial cyst of ovary   . Anxiety     pt states she experiences panic attacks  . GERD (gastroesophageal reflux disease)   . Migraine   . Hernia, hiatal   . Fibroid   . Panic attack   . Panic attacks 01/09/2012  . Fibromyalgia   . Urinary tract infection   . Fibromyalgia   . PID (pelvic inflammatory disease)   . Trichomonas   . Depression     seeing a therapist  . Preterm labor     preterm contractions, delivered full term    Past Surgical History  Procedure Laterality Date  . Laparoscopy  06/18/2011    Procedure: LAPAROSCOPY DIAGNOSTIC;  Surgeon: Hollie Salk C. Marice Potter, MD;  Location: [facility];  Service: Gynecology;  Laterality: Bilateral;  with  bilateral chromopertubation with excision of endometrial biopsy  . Laparoscopic endometriosis fulguration  06/2011  . Cesarean section  04/06/2012    Procedure: CESAREAN SECTION;  Surgeon: Purcell Nails, MD;  Location: [facility];  Service: Gynecology;  Laterality: N/A;  Primary cesarean section of baby boy  at 19     Family History  Problem Relation Age of Onset  . Fibroids Mother   . Hypertension Mother   . Hypertension Father   . Diabetes Father   . Kidney disease Father     kidney stones  . Gout Father   . Anesthesia problems Neg Hx   . Other Neg Hx     History  Substance Use Topics  . Smoking status: Never Smoker   . Smokeless tobacco: Never Used  . Alcohol Use: No    OB History   Grav Para Term Preterm Abortions TAB SAB Ect Mult Living   1 1 1  0 0 0 0 0 0 1      Review of Systems  Constitutional: Positive for fatigue. Negative for fever, chills and diaphoresis.  HENT: Positive for ear pain (Pressure bilaterally), congestion, rhinorrhea and sinus pressure. Negative for drooling, trouble swallowing and tinnitus.   Eyes: Negative for redness and visual disturbance.  Respiratory: Positive for cough.   Cardiovascular: Negative for chest pain.  Musculoskeletal: Positive for myalgias.  Skin: Negative for color change, pallor and rash.  Neurological: Positive for headaches (Sinus headache). Negative for syncope, weakness and numbness.  All  other systems reviewed and are negative.    Allergies  Sertraline  Home Medications   Current Outpatient Rx  Name  Route  Sig  Dispense  Refill  . calcium carbonate (TUMS - DOSED IN MG ELEMENTAL CALCIUM) 500 MG chewable tablet   Oral   Chew 2 tablets by mouth daily as needed for heartburn.         . cyclobenzaprine (FLEXERIL) 10 MG tablet   Oral   Take 10 mg by mouth 3 (three) times daily as needed for muscle spasms.         . diphenhydrAMINE (SOMINEX) 25 MG tablet   Oral   Take 25 mg by mouth at bedtime as needed  for allergies or sleep.         . ranitidine (ZANTAC) 150 MG capsule   Oral   Take 150 mg by mouth daily as needed. heartburn         . simethicone (MYLICON) 125 MG chewable tablet   Oral   Chew 125 mg by mouth every 6 (six) hours as needed for flatulence.         . fluticasone (FLONASE) 50 MCG/ACT nasal spray   Nasal   Place 2 sprays into the nose daily as needed for rhinitis.   16 g   2   . HYDROcodone-homatropine (HYCODAN) 5-1.5 MG/5ML syrup   Oral   Take 5 mLs by mouth every 6 (six) hours as needed for cough.   120 mL   0     BP 130/77  Pulse 110  Temp(Src) 98.6 F (37 C)  Resp 18  SpO2 100%  LMP 01/21/2013  Physical Exam  Constitutional: She is oriented to person, place, and time. She appears well-developed and well-nourished. No distress.  HENT:  Head: Normocephalic and atraumatic. No trismus in the jaw.  Right Ear: Tympanic membrane, external ear and ear canal normal. No tenderness. No mastoid tenderness. Tympanic membrane is not perforated, not erythematous, not retracted and not bulging.  Left Ear: Tympanic membrane, external ear and ear canal normal. No tenderness. No mastoid tenderness. Tympanic membrane is not perforated, not erythematous, not retracted and not bulging.  Nose: Right sinus exhibits maxillary sinus tenderness and frontal sinus tenderness. Left sinus exhibits maxillary sinus tenderness and frontal sinus tenderness.  Mouth/Throat: Uvula is midline and mucous membranes are normal. No edematous. Posterior oropharyngeal erythema present. No oropharyngeal exudate, posterior oropharyngeal edema or tonsillar abscesses.  Eyes: Conjunctivae and EOM are normal. Pupils are equal, round, and reactive to light. No scleral icterus.  Neck: Normal range of motion. Neck supple.  No nuchal rigidity or meningeal signs  Cardiovascular: Normal rate, regular rhythm and normal heart sounds.   Patient not tachycardic as noted in triage  Pulmonary/Chest: Effort  normal and breath sounds normal. No stridor. No respiratory distress. She has no wheezes. She has no rales.  Musculoskeletal: Normal range of motion.  Lymphadenopathy:    She has no cervical adenopathy.  Neurological: She is alert and oriented to person, place, and time.  Skin: Skin is warm and dry. No rash noted. She is not diaphoretic. No erythema. No pallor.  Psychiatric: She has a normal mood and affect. Her behavior is normal.    ED Course  Procedures (including critical care time)  Labs Reviewed  RAPID STREP SCREEN  CULTURE, GROUP A STREP  POCT PREGNANCY, URINE   Dg Chest 2 View  02/18/2013   *RADIOLOGY REPORT*  Clinical Data: Chest pressure and cough.  CHEST - 2  VIEW  Comparison: PA and lateral chest 03/11/2010 c t chest 09/18/2009.  Findings: Lungs are clear.  Heart size is normal.  No pneumothorax or pleural fluid.  IMPRESSION: Negative chest.   Original Report Authenticated By: Holley Dexter, M.D.    1. Sinusitis, acute     MDM  Uncomplicated sinusitis, likely viral - patient well and nontoxic appearing on my examination and afebrile in triage. Airway patent and uvula midline without evidence of peritonsillar abscess. Heart RRR and lungs CTAB. Patient without tachypnea, dyspnea, or hypoxia and chest x-ray without evidence of pneumonia, pneumothorax, pleural effusion, or other cardiopulmonary abnormality. Rapid strep screen negative. Patient appropriate for discharge and symptoms to be managed with Mucinex D, ibuprofen, Flonase as needed, and Hycodan as needed for sore throat and cough. Indications for ED return discussed with the patient who verbalizes comfort and understanding with this discharge plan with no unaddressed concerns.        Antony Madura, PA-C 02/23/13 1839

## 2013-02-18 NOTE — ED Notes (Signed)
Per pt sts sore throat, cough, congestion and achy all over.

## 2013-02-18 NOTE — ED Notes (Signed)
Pt c/o of runny nose, sore throat, congestion, mild abdominal pain. Throat with bumpy lesions. Rapid strep obtained per protocol.

## 2013-02-20 LAB — CULTURE, GROUP A STREP

## 2013-02-24 NOTE — ED Provider Notes (Signed)
Medical screening examination/treatment/procedure(s) were performed by non-physician practitioner and as supervising physician I was immediately available for consultation/collaboration.   Carleene Cooper III, MD 02/24/13 [phone]

## 2013-03-25 ENCOUNTER — Emergency Department (INDEPENDENT_AMBULATORY_CARE_PROVIDER_SITE_OTHER)
Admission: EM | Admit: 2013-03-25 | Discharge: 2013-03-25 | Disposition: A | Discharge status: Home / Self Care | Payer: Self-pay | Source: Home / Self Care | Attending: Family Medicine | Admitting: Family Medicine

## 2013-03-25 DIAGNOSIS — J329 Chronic sinusitis, unspecified: Secondary | ICD-10-CM

## 2013-03-25 LAB — POCT RAPID STREP A: Streptococcus, Group A Screen (Direct): NEGATIVE

## 2013-03-25 MED ORDER — FLUTICASONE PROPIONATE 50 MCG/ACT NA SUSP: 2.0000 | Freq: Every day | NASAL | Status: DC

## 2013-03-25 MED ORDER — CETIRIZINE-PSEUDOEPHEDRINE ER 5-120 MG PO TB12: 1.0000 | ORAL_TABLET | Freq: Two times a day (BID) | ORAL | Status: DC | PRN

## 2013-03-25 MED ORDER — GUAIFENESIN-CODEINE 100-10 MG/5ML PO SOLN: 5.0000 mL | Freq: Three times a day (TID) | ORAL | Status: DC | PRN

## 2013-03-25 MED ORDER — PREDNISONE 20 MG PO TABS: ORAL_TABLET | ORAL | Status: DC

## 2013-03-25 MED ORDER — AMOXICILLIN 500 MG PO CAPS: 500.0000 mg | ORAL_CAPSULE | Freq: Three times a day (TID) | ORAL | Status: DC

## 2013-03-25 NOTE — ED Provider Notes (Signed)
History    CSN: [id number] Arrival date & time 03/25/13  2536  First MD Initiated Contact with Patient 03/25/13 1019     Chief Complaint  Patient presents with  . Sore Throat  . Cough   (Consider location/radiation/quality/duration/timing/severity/associated sxs/prior Treatment) HPI Comments: 32 year old female nondiabetic. Here complaining of nasal congestion, sinus pressure, headaches bilateral ear pain and sore throat intermittently for over a month. Patient stated that she has a history of seasonal allergies. States her symptoms had been worsening and she had fever for 2 days last week. Complains of thick yellow nasal discharge. Reports cough intermittently productive of white/yellow sputum. She is a nonsmoker. Has had issues wih chronic cough in the past and has been treated for acid reflux. Denies current reflux symptoms. Denies chest pain with inspiration. Patient reports nausea and sporadic vomiting associated to her symptoms. Appetite is decreased. Denies abdominal pain or diarrhea. No rashes.   Past Medical History  Diagnosis Date  . Neuromuscular disorder     fibromyalagia  . Back pain   . Gastritis   . Interstitial cystitis   . Endometrial cyst of ovary   . Anxiety     pt states she experiences panic attacks  . GERD (gastroesophageal reflux disease)   . Migraine   . Hernia, hiatal   . Fibroid   . Panic attack   . Panic attacks 01/09/2012  . Fibromyalgia   . Urinary tract infection   . Fibromyalgia   . PID (pelvic inflammatory disease)   . Trichomonas   . Depression     seeing a therapist  . Preterm labor     preterm contractions, delivered full term   Past Surgical History  Procedure Laterality Date  . Laparoscopy  06/18/2011    Procedure: LAPAROSCOPY DIAGNOSTIC;  Surgeon: Hollie Salk C. Marice Potter, MD;  Location: [facility];  Service: Gynecology;  Laterality: Bilateral;  with bilateral chromopertubation with excision of endometrial biopsy  . Laparoscopic endometriosis  fulguration  06/2011  . Cesarean section  04/06/2012    Procedure: CESAREAN SECTION;  Surgeon: Purcell Nails, MD;  Location: [facility];  Service: Gynecology;  Laterality: N/A;  Primary cesarean section of baby boy  at 39    Family History  Problem Relation Age of Onset  . Fibroids Mother   . Hypertension Mother   . Hypertension Father   . Diabetes Father   . Kidney disease Father     kidney stones  . Gout Father   . Anesthesia problems Neg Hx   . Other Neg Hx    History  Substance Use Topics  . Smoking status: Never Smoker   . Smokeless tobacco: Never Used  . Alcohol Use: No   OB History   Grav Para Term Preterm Abortions TAB SAB Ect Mult Living   1 1 1  0 0 0 0 0 0 1     Review of Systems  Constitutional: Positive for fever and appetite change. Negative for chills, diaphoresis and fatigue.  HENT: Positive for ear pain, congestion, sore throat, rhinorrhea, sneezing, postnasal drip and sinus pressure. Negative for facial swelling, trouble swallowing, neck pain, voice change and ear discharge.   Eyes: Negative for discharge and redness.  Respiratory: Positive for cough. Negative for chest tightness, shortness of breath and wheezing.   Cardiovascular: Negative for chest pain.  Gastrointestinal: Positive for nausea and vomiting. Negative for abdominal pain and diarrhea.  Musculoskeletal: Negative for myalgias and arthralgias.  Skin: Negative for rash.  Allergic/Immunologic: Positive for environmental allergies.  Neurological: Positive for headaches. Negative for dizziness.    Allergies  Sertraline  Home Medications   Current Outpatient Rx  Name  Route  Sig  Dispense  Refill  . amoxicillin (AMOXIL) 500 MG capsule   Oral   Take 1 capsule (500 mg total) by mouth 3 (three) times daily.   21 capsule   0   . calcium carbonate (TUMS - DOSED IN MG ELEMENTAL CALCIUM) 500 MG chewable tablet   Oral   Chew 2 tablets by mouth daily as needed for heartburn.         .  cetirizine-pseudoephedrine (ZYRTEC-D) 5-120 MG per tablet   Oral   Take 1 tablet by mouth 2 (two) times daily as needed for allergies or rhinitis.   30 tablet   0   . fluticasone (FLONASE) 50 MCG/ACT nasal spray   Nasal   Place 2 sprays into the nose daily.   16 g   0   . guaiFENesin-codeine 100-10 MG/5ML syrup   Oral   Take 5 mLs by mouth 3 (three) times daily as needed for cough.   120 mL   0   . predniSONE (DELTASONE) 20 MG tablet      2 tabs po daily for 5 days   10 tablet   0   . ranitidine (ZANTAC) 150 MG capsule   Oral   Take 150 mg by mouth daily as needed. heartburn         . simethicone (MYLICON) 125 MG chewable tablet   Oral   Chew 125 mg by mouth every 6 (six) hours as needed for flatulence.          BP 123/81  Pulse 88  Temp(Src) 98.7 F (37.1 C) (Oral)  Resp 16  SpO2 100% Physical Exam  Nursing note and vitals reviewed. Constitutional: She is oriented to person, place, and time. She appears well-developed and well-nourished. No distress.  HENT:  Head: Normocephalic and atraumatic.  Mouth/Throat: No oropharyngeal exudate.  Nasal Congestion with erythema and significant swelling of nasal turbinates. pharyngeal erythema no exudates. No uvula deviation. No trismus. TM's with clear fluid behind, no dullness, no swelling or bulging bilaterally.  Eyes: Conjunctivae and EOM are normal. Pupils are equal, round, and reactive to light. Right eye exhibits no discharge. Left eye exhibits no discharge.  Neck: Neck supple. No JVD present. No thyromegaly present.  Cardiovascular: Normal heart sounds.   Pulmonary/Chest: Breath sounds normal. No respiratory distress. She has no wheezes. She has no rales.  Lymphadenopathy:    She has no cervical adenopathy.  Neurological: She is alert and oriented to person, place, and time.  Skin: No rash noted. She is not diaphoretic.    ED Course  Procedures (including critical care time) Labs Reviewed  POCT RAPID STREP  A ([facility] ONLY)   No results found. 1. Rhinosinusitis     MDM  Negative rapid strep test.  Impress chronic allergic rhino sinusitis with acute exacerbation.  Treated with prednisone, fluticasone nasal, amoxicillin, guaifenesin/codeine.  Supportive care and red flags should prompt return to medical attention discussed with patient and provided in writing.  ENT referral as needed.   Sharin Grave, MD 03/25/13 1051

## 2013-03-25 NOTE — ED Notes (Addendum)
Complaining of sore throat, coughing and neck pain for a week now.  Patient states the symptoms was going away but has came back.  Patient has spitted up some yellow bloody mucous.  Patient states she has a very bad headache.

## 2013-03-27 LAB — CULTURE, GROUP A STREP

## 2013-06-30 ENCOUNTER — Ambulatory Visit (HOSPITAL_COMMUNITY): Payer: Self-pay

## 2013-07-27 ENCOUNTER — Other Ambulatory Visit (HOSPITAL_COMMUNITY): Payer: Self-pay | Admitting: *Deleted

## 2013-07-27 DIAGNOSIS — N6320 Unspecified lump in the left breast, unspecified quadrant: Secondary | ICD-10-CM

## 2013-07-28 ENCOUNTER — Ambulatory Visit (HOSPITAL_COMMUNITY)
Admission: RE | Admit: 2013-07-28 | Discharge: 2013-07-28 | Disposition: A | Discharge status: Home / Self Care | Payer: Self-pay | Source: Ambulatory Visit | Attending: Obstetrics and Gynecology | Admitting: Obstetrics and Gynecology

## 2013-07-28 ENCOUNTER — Encounter (HOSPITAL_COMMUNITY): Payer: Self-pay

## 2013-07-28 ENCOUNTER — Encounter (INDEPENDENT_AMBULATORY_CARE_PROVIDER_SITE_OTHER): Payer: Self-pay

## 2013-07-28 VITALS — BP 118/64 | Temp 98.7°F | Ht 66.5 in | Wt 141.4 lb

## 2013-07-28 DIAGNOSIS — Z01419 Encounter for gynecological examination (general) (routine) without abnormal findings: Secondary | ICD-10-CM

## 2013-07-28 DIAGNOSIS — N6325 Unspecified lump in the left breast, overlapping quadrants: Secondary | ICD-10-CM | POA: Insufficient documentation

## 2013-07-28 NOTE — Progress Notes (Signed)
Complaints of left breast lump x 1 month with some tenderness in bilateral breast x last 3 days.  Pap Smear:    Pap smear completed today. Patients last Pap smear was over 3 years ago per patient. Per patient has no history of an abnormal Pap smear. No Pap smear results in EPIC.  Physical exam: Breasts Breasts symmetrical. No skin abnormalities bilateral breasts. No nipple retraction bilateral breasts. No nipple discharge bilateral breasts. No lymphadenopathy. No lumps palpated right breast. Palpated a lump within the left breast at 3 o'clock 4 cm from the nipple. Patient complained of left outer breast tenderness on exam where lump was palpated. Referred patient to the [facility] for diagnostic mammogram and possible left breast ultrasound. Appointment scheduled for Wednesday, August 12, 2013 at 1400.      Pelvic/Bimanual   Ext Genitalia No lesions, no swelling and no discharge observed on external genitalia.         Vagina Vagina pink and normal texture. No lesions or discharge observed in vagina.          Cervix Cervix is present. Cervix pink and of normal texture. Brownish colored vaginal bleeding observed at cervical os. Per patient has noticed vaginal spotting lately and stated she might be pregnant for is not using birth control.    Uterus Uterus is present and palpable. Uterus in normal position and slightly enlarged.        Adnexae Bilateral ovaries present and palpable. No tenderness on palpation.          Rectovaginal No rectal exam completed today since patient had no rectal complaints. No skin abnormalities observed on exam.

## 2013-07-28 NOTE — Patient Instructions (Signed)
Taught Meredith Howard how to perform BSE and gave educational materials to take home. Let her know [facility] will cover Pap smears every 3 years unless has a history of abnormal Pap smears. Referred patient to the [facility] for diagnostic mammogram and possible left breast ultrasound. Appointment scheduled for Wednesday, August 12, 2013 at 1400. Informed patient that since she does not use birth control that she will need to take a urine pregnancy test prior to her appointment. Let her know if pregnant they will not be able to do the mammogram. Patient aware of appointment and will be there. Let patient know will follow up with her within the next couple weeks with results of Pap smear by phone. Meredith Howard verbalized understanding.  Bodie Abernethy, Kathaleen Maser, RN 4:08 PM

## 2013-08-06 ENCOUNTER — Telehealth (HOSPITAL_COMMUNITY): Payer: Self-pay | Admitting: *Deleted

## 2013-08-06 ENCOUNTER — Inpatient Hospital Stay (HOSPITAL_COMMUNITY)
Admission: AD | Admit: 2013-08-06 | Discharge: 2013-08-06 | Disposition: A | Discharge status: Home / Self Care | Payer: Self-pay | Source: Ambulatory Visit | Attending: Obstetrics and Gynecology | Admitting: Obstetrics and Gynecology

## 2013-08-06 ENCOUNTER — Encounter (HOSPITAL_COMMUNITY): Payer: Self-pay | Admitting: *Deleted

## 2013-08-06 DIAGNOSIS — D649 Anemia, unspecified: Secondary | ICD-10-CM | POA: Insufficient documentation

## 2013-08-06 DIAGNOSIS — N949 Unspecified condition associated with female genital organs and menstrual cycle: Secondary | ICD-10-CM | POA: Insufficient documentation

## 2013-08-06 DIAGNOSIS — N921 Excessive and frequent menstruation with irregular cycle: Secondary | ICD-10-CM | POA: Insufficient documentation

## 2013-08-06 DIAGNOSIS — G8929 Other chronic pain: Secondary | ICD-10-CM | POA: Insufficient documentation

## 2013-08-06 LAB — WET PREP, GENITAL
Trich, Wet Prep: NONE SEEN
Yeast Wet Prep HPF POC: NONE SEEN

## 2013-08-06 LAB — URINALYSIS, ROUTINE W REFLEX MICROSCOPIC
Bilirubin Urine: NEGATIVE
Ketones, ur: NEGATIVE mg/dL
Nitrite: NEGATIVE
pH: 5.5 (ref 5.0–8.0)

## 2013-08-06 LAB — URINE MICROSCOPIC-ADD ON

## 2013-08-06 LAB — CBC
Platelets: 278 10*3/uL (ref 150–400)
RDW: 15.7 % — ABNORMAL HIGH (ref 11.5–15.5)
WBC: 3.1 10*3/uL — ABNORMAL LOW (ref 4.0–10.5)

## 2013-08-06 MED ORDER — KETOROLAC TROMETHAMINE 30 MG/ML IJ SOLN
30.0000 mg | Freq: Once | INTRAMUSCULAR | Status: AC
  Administered 2013-08-06: 30 mg via INTRAVENOUS
  Filled 2013-08-06: qty 1

## 2013-08-06 NOTE — MAU Note (Signed)
Patient states she had a normal period on 11-7. States one week after the period has been spotting every day with abdominal cramping off and on. States she has been feeling nausea, fatigue and general tiredness for about 1 1/2 weeks.

## 2013-08-06 NOTE — MAU Provider Note (Signed)
History     CSN: [id number]  Arrival date and time: 08/06/13 1617   First Provider Initiated Contact with Patient 08/06/13 1713      Chief Complaint  Patient presents with  . Possible Pregnancy  . Nausea  . Fatigue  . Abdominal Pain   HPI This is a 32 y.o. female who presents with c/o spotting two weeks before period. States she thinks she is pregnant. Does not feel well with nausea and fatigue. Did not do test at home. Has a history of PID and fibroids. States periods are usually heavy with clots. Just had a normal pap at [facility]. Had a baby 2 yrs ago. Has had some chronic pain issues. Previously a patient of [facility], lost insurance.   RN note: Patient states she had a normal period on 11-7. States one week after the period has been spotting every day with abdominal cramping off and on. States she has been feeling nausea, fatigue and general tiredness for about 1 1/2 weeks.       OB History   Grav Para Term Preterm Abortions TAB SAB Ect Mult Living   1 1 1  0 0 0 0 0 0 1      Past Medical History  Diagnosis Date  . Neuromuscular disorder     fibromyalagia  . Back pain   . Gastritis   . Interstitial cystitis   . Endometrial cyst of ovary   . Anxiety     pt states she experiences panic attacks  . GERD (gastroesophageal reflux disease)   . Migraine   . Hernia, hiatal   . Fibroid   . Panic attack   . Panic attacks 01/09/2012  . Fibromyalgia   . Urinary tract infection   . Fibromyalgia   . PID (pelvic inflammatory disease)   . Trichomonas   . Depression     seeing a therapist  . Preterm labor     preterm contractions, delivered full term    Past Surgical History  Procedure Laterality Date  . Laparoscopy  06/18/2011    Procedure: LAPAROSCOPY DIAGNOSTIC;  Surgeon: Hollie Salk C. Marice Potter, MD;  Location: [facility];  Service: Gynecology;  Laterality: Bilateral;  with bilateral chromopertubation with excision of endometrial biopsy  . Laparoscopic endometriosis fulguration  06/2011  .  Cesarean section  04/06/2012    Procedure: CESAREAN SECTION;  Surgeon: Purcell Nails, MD;  Location: [facility];  Service: Gynecology;  Laterality: N/A;  Primary cesarean section of baby boy  at 47     Family History  Problem Relation Age of Onset  . Fibroids Mother   . Hypertension Mother   . Hypertension Father   . Diabetes Father   . Kidney disease Father     kidney stones  . Gout Father   . Cancer Father     prostate  . Anesthesia problems Neg Hx   . Other Neg Hx     History  Substance Use Topics  . Smoking status: Never Smoker   . Smokeless tobacco: Never Used  . Alcohol Use: No    Allergies:  Allergies  Allergen Reactions  . Percocet [Oxycodone-Acetaminophen] Itching  . Sertraline Other (See Comments)    Dizzy, headache, felt light-headed    Prescriptions prior to admission  Medication Sig Dispense Refill  . aspirin-acetaminophen-caffeine (EXCEDRIN MIGRAINE) 250-250-65 MG per tablet Take 2 tablets by mouth daily as needed (For pain.).      Marland Kitchen diphenhydrAMINE (BENADRYL) 25 MG tablet Take 25-50 mg by mouth at bedtime  as needed for allergies.      . diphenhydramine-acetaminophen (TYLENOL PM) 25-500 MG TABS Take 2 tablets by mouth at bedtime as needed (For pain and sleep.).      Marland Kitchen fluticasone (FLONASE) 50 MCG/ACT nasal spray Place 2 sprays into the nose daily.  16 g  0  . ibuprofen (ADVIL,MOTRIN) 200 MG tablet Take 600 mg by mouth every 6 (six) hours as needed for cramping.      . simethicone (MYLICON) 125 MG chewable tablet Chew 125 mg by mouth every 6 (six) hours as needed for flatulence.        Review of Systems  Constitutional: Positive for malaise/fatigue. Negative for fever and chills.  Gastrointestinal: Positive for nausea and abdominal pain. Negative for vomiting, diarrhea and constipation.  Genitourinary: Negative for dysuria.  Neurological: Positive for weakness. Negative for headaches.   Physical Exam   Blood pressure 131/89, pulse 111, resp. rate 16,  height 5\' 7"  (1.702 m), weight 142 lb 9.6 oz (64.683 kg), last menstrual period 07/10/2013, SpO2 100.00%.  Physical Exam  Constitutional: She is oriented to person, place, and time. She appears well-developed and well-nourished. No distress.  Cardiovascular: Normal rate.   Respiratory: Effort normal.  GI: Soft. She exhibits no distension and no mass. There is tenderness (mild tenderness pelvis). There is no rebound and no guarding.  Genitourinary: Vagina normal and uterus normal. No vaginal discharge found.  Musculoskeletal: Normal range of motion.  Neurological: She is alert and oriented to person, place, and time.  Skin: Skin is warm and dry.  Psychiatric: She has a normal mood and affect.    MAU Course  Procedures  MDM Results for orders placed during the hospital encounter of 08/06/13 (from the past 24 hour(s))  URINALYSIS, ROUTINE W REFLEX MICROSCOPIC     Status: Abnormal   Collection Time    08/06/13  4:45 PM      Result Value Range   Color, Urine YELLOW  YELLOW   APPearance CLEAR  CLEAR   Specific Gravity, Urine 1.010  1.005 - 1.030   pH 5.5  5.0 - 8.0   Glucose, UA NEGATIVE  NEGATIVE mg/dL   Hgb urine dipstick LARGE (*) NEGATIVE   Bilirubin Urine NEGATIVE  NEGATIVE   Ketones, ur NEGATIVE  NEGATIVE mg/dL   Protein, ur NEGATIVE  NEGATIVE mg/dL   Urobilinogen, UA 0.2  0.0 - 1.0 mg/dL   Nitrite NEGATIVE  NEGATIVE   Leukocytes, UA NEGATIVE  NEGATIVE  URINE MICROSCOPIC-ADD ON     Status: Abnormal   Collection Time    08/06/13  4:45 PM      Result Value Range   Squamous Epithelial / LPF FEW (*) RARE   WBC, UA 0-2  <3 WBC/hpf   RBC / HPF 3-6  <3 RBC/hpf   Bacteria, UA FEW (*) RARE   Urine-Other MUCOUS PRESENT    POCT PREGNANCY, URINE     Status: None   Collection Time    08/06/13  4:55 PM      Result Value Range   Preg Test, Ur NEGATIVE  NEGATIVE  WET PREP, GENITAL     Status: Abnormal   Collection Time    08/06/13  5:30 PM      Result Value Range   Yeast Wet Prep  HPF POC NONE SEEN  NONE SEEN   Trich, Wet Prep NONE SEEN  NONE SEEN   Clue Cells Wet Prep HPF POC NONE SEEN  NONE SEEN   WBC, Wet Prep HPF  POC FEW (*) NONE SEEN  CBC     Status: Abnormal   Collection Time    08/06/13  5:40 PM      Result Value Range   WBC 3.1 (*) 4.0 - 10.5 K/uL   RBC 3.89  3.87 - 5.11 MIL/uL   Hemoglobin 8.9 (*) 12.0 - 15.0 g/dL   HCT 24.4 (*) 01.0 - 27.2 %   MCV 73.3 (*) 78.0 - 100.0 fL   MCH 22.9 (*) 26.0 - 34.0 pg   MCHC 31.2  30.0 - 36.0 g/dL   RDW 53.6 (*) 64.4 - 03.4 %   Platelets 278  150 - 400 K/uL  HCG, QUANTITATIVE, PREGNANCY     Status: None   Collection Time    08/06/13  5:40 PM      Result Value Range   hCG, Beta Chain, Quant, S <1  <5 mIU/mL     Assessment and Plan  A:  Chronic pelvic pain       Spotting between periods       Anemia       Hx endometiosis, stage 2      Probable viral syndrome  P  Discussed findings including neg preg test       Wants followup with Dr Marice Potter      Wants to get pregnant, declines OCPs      Supportive care      Recommend start iron twice daily  [facility] 08/06/2013, 5:36 PM

## 2013-08-06 NOTE — Telephone Encounter (Signed)
Telephoned patient at home # and discussed negative pap smear results. Next pap smear due in 3 years. Patient voiced understanding.  

## 2013-08-07 NOTE — MAU Provider Note (Signed)
Attestation of Attending Supervision of Advanced Practitioner (CNM/NP): Evaluation and management procedures were performed by the Advanced Practitioner under my supervision and collaboration.  I have reviewed the Advanced Practitioner's note and chart, and I agree with the management and plan.  Taletha Twiford 08/07/2013 8:11 AM   

## 2013-08-12 ENCOUNTER — Ambulatory Visit
Admission: RE | Admit: 2013-08-12 | Discharge: 2013-08-12 | Disposition: A | Discharge status: Home / Self Care | Payer: No Typology Code available for payment source | Source: Ambulatory Visit | Attending: Obstetrics and Gynecology | Admitting: Obstetrics and Gynecology

## 2013-08-12 DIAGNOSIS — N6320 Unspecified lump in the left breast, unspecified quadrant: Secondary | ICD-10-CM

## 2013-09-09 ENCOUNTER — Ambulatory Visit (INDEPENDENT_AMBULATORY_CARE_PROVIDER_SITE_OTHER): Payer: No Typology Code available for payment source | Admitting: Obstetrics & Gynecology

## 2013-09-09 ENCOUNTER — Encounter: Payer: Self-pay | Admitting: Obstetrics & Gynecology

## 2013-09-09 VITALS — Ht 63.0 in | Wt 139.2 lb

## 2013-09-09 DIAGNOSIS — N809 Endometriosis, unspecified: Secondary | ICD-10-CM

## 2013-09-09 NOTE — Progress Notes (Signed)
   Subjective:    Patient ID: Meredith Howard, female    DOB: Aug 24, 1981, 33 y.o.   MRN: [id number]  HPI  33 yo M AA P53 (60 month old son) who has biopsy proven stage 2 endometriosis, found on laparoscopy 2012. She would like another pregnancy but her husband says No right now. Her endometriosis pain has returned and she is having heavy periods with clots. She was seen in the MAU last month. Her hemoglobin was 8.9 at that time. She has significant dyspareunia.   Review of Systems     Objective:   Physical Exam  NSSRV, tender, no adnexal masses      Assessment & Plan:  Menorrhagia with anemia, known endometriosis. I have suggested OCPs but she has tried several types and has not liked any of them. She was also offered Mirena and she would like to try it. Rec iron daily

## 2013-09-10 LAB — TSH: TSH: 0.427 u[IU]/mL (ref 0.350–4.500)

## 2013-09-17 ENCOUNTER — Encounter: Payer: Self-pay | Admitting: *Deleted

## 2013-09-28 ENCOUNTER — Encounter (HOSPITAL_COMMUNITY): Payer: Self-pay | Admitting: Emergency Medicine

## 2013-09-28 ENCOUNTER — Emergency Department (HOSPITAL_COMMUNITY): Payer: No Typology Code available for payment source

## 2013-09-28 DIAGNOSIS — F3289 Other specified depressive episodes: Secondary | ICD-10-CM | POA: Insufficient documentation

## 2013-09-28 DIAGNOSIS — R51 Headache: Secondary | ICD-10-CM | POA: Insufficient documentation

## 2013-09-28 DIAGNOSIS — Z8619 Personal history of other infectious and parasitic diseases: Secondary | ICD-10-CM | POA: Insufficient documentation

## 2013-09-28 DIAGNOSIS — IMO0002 Reserved for concepts with insufficient information to code with codable children: Secondary | ICD-10-CM | POA: Insufficient documentation

## 2013-09-28 DIAGNOSIS — Z8751 Personal history of pre-term labor: Secondary | ICD-10-CM | POA: Insufficient documentation

## 2013-09-28 DIAGNOSIS — F410 Panic disorder [episodic paroxysmal anxiety] without agoraphobia: Secondary | ICD-10-CM | POA: Insufficient documentation

## 2013-09-28 DIAGNOSIS — J069 Acute upper respiratory infection, unspecified: Secondary | ICD-10-CM | POA: Insufficient documentation

## 2013-09-28 DIAGNOSIS — Z87440 Personal history of urinary (tract) infections: Secondary | ICD-10-CM | POA: Insufficient documentation

## 2013-09-28 DIAGNOSIS — Z8679 Personal history of other diseases of the circulatory system: Secondary | ICD-10-CM | POA: Insufficient documentation

## 2013-09-28 DIAGNOSIS — K219 Gastro-esophageal reflux disease without esophagitis: Secondary | ICD-10-CM | POA: Insufficient documentation

## 2013-09-28 DIAGNOSIS — Z79899 Other long term (current) drug therapy: Secondary | ICD-10-CM | POA: Insufficient documentation

## 2013-09-28 DIAGNOSIS — Z8742 Personal history of other diseases of the female genital tract: Secondary | ICD-10-CM | POA: Insufficient documentation

## 2013-09-28 DIAGNOSIS — Z8659 Personal history of other mental and behavioral disorders: Secondary | ICD-10-CM | POA: Insufficient documentation

## 2013-09-28 DIAGNOSIS — Z8719 Personal history of other diseases of the digestive system: Secondary | ICD-10-CM | POA: Insufficient documentation

## 2013-09-28 DIAGNOSIS — F329 Major depressive disorder, single episode, unspecified: Secondary | ICD-10-CM | POA: Insufficient documentation

## 2013-09-28 LAB — POCT I-STAT TROPONIN I: Troponin i, poc: 0 ng/mL (ref 0.00–0.08)

## 2013-09-28 LAB — BASIC METABOLIC PANEL
BUN: 10 mg/dL (ref 6–23)
CO2: 24 mEq/L (ref 19–32)
CREATININE: 0.71 mg/dL (ref 0.50–1.10)
Calcium: 9.5 mg/dL (ref 8.4–10.5)
Chloride: 98 mEq/L (ref 96–112)
Glucose, Bld: 98 mg/dL (ref 70–99)
Potassium: 4.2 mEq/L (ref 3.7–5.3)
Sodium: 138 mEq/L (ref 137–147)

## 2013-09-28 LAB — CBC
HEMATOCRIT: 30.4 % — AB (ref 36.0–46.0)
Hemoglobin: 9.5 g/dL — ABNORMAL LOW (ref 12.0–15.0)
MCH: 22.6 pg — ABNORMAL LOW (ref 26.0–34.0)
MCHC: 31.3 g/dL (ref 30.0–36.0)
MCV: 72.2 fL — AB (ref 78.0–100.0)
PLATELETS: 380 10*3/uL (ref 150–400)
RBC: 4.21 MIL/uL (ref 3.87–5.11)
RDW: 16.8 % — AB (ref 11.5–15.5)
WBC: 4.5 10*3/uL (ref 4.0–10.5)

## 2013-09-28 NOTE — ED Notes (Signed)
Patient with chest pain and shortness of breath with dizziness.  Patient states that it started last night.

## 2013-09-29 ENCOUNTER — Emergency Department (HOSPITAL_COMMUNITY)
Admission: EM | Admit: 2013-09-29 | Discharge: 2013-09-29 | Disposition: A | Discharge status: Home / Self Care | Payer: No Typology Code available for payment source | Attending: Emergency Medicine | Admitting: Emergency Medicine

## 2013-09-29 DIAGNOSIS — R51 Headache: Secondary | ICD-10-CM

## 2013-09-29 DIAGNOSIS — J069 Acute upper respiratory infection, unspecified: Secondary | ICD-10-CM

## 2013-09-29 DIAGNOSIS — R519 Headache, unspecified: Secondary | ICD-10-CM

## 2013-09-29 MED ORDER — OSELTAMIVIR PHOSPHATE 75 MG PO CAPS: 75.0000 mg | ORAL_CAPSULE | Freq: Two times a day (BID) | ORAL | Status: DC

## 2013-09-29 MED ORDER — DIPHENHYDRAMINE HCL 50 MG/ML IJ SOLN
25.0000 mg | Freq: Once | INTRAMUSCULAR | Status: AC
  Administered 2013-09-29: 25 mg via INTRAVENOUS
  Filled 2013-09-29: qty 1

## 2013-09-29 MED ORDER — OSELTAMIVIR PHOSPHATE 75 MG PO CAPS
75.0000 mg | ORAL_CAPSULE | Freq: Once | ORAL | Status: AC
  Administered 2013-09-29: 75 mg via ORAL
  Filled 2013-09-29: qty 1

## 2013-09-29 MED ORDER — GUAIFENESIN 100 MG/5ML PO LIQD: 100.0000 mg | ORAL | Status: DC | PRN

## 2013-09-29 MED ORDER — SODIUM CHLORIDE 0.9 % IV BOLUS (SEPSIS)
1000.0000 mL | Freq: Once | INTRAVENOUS | Status: AC
  Administered 2013-09-29: 1000 mL via INTRAVENOUS

## 2013-09-29 MED ORDER — METOCLOPRAMIDE HCL 5 MG/ML IJ SOLN
10.0000 mg | Freq: Once | INTRAMUSCULAR | Status: AC
  Administered 2013-09-29: 10 mg via INTRAVENOUS
  Filled 2013-09-29: qty 2

## 2013-09-29 MED ORDER — IBUPROFEN 800 MG PO TABS: 800.0000 mg | ORAL_TABLET | Freq: Three times a day (TID) | ORAL | Status: DC

## 2013-09-29 MED ORDER — DEXAMETHASONE SODIUM PHOSPHATE 4 MG/ML IJ SOLN
10.0000 mg | Freq: Once | INTRAMUSCULAR | Status: AC
  Administered 2013-09-29: 10 mg via INTRAVENOUS
  Filled 2013-09-29: qty 3

## 2013-09-29 NOTE — Discharge Instructions (Signed)
Rest and drink plenty of fluids. Take Motrin and Robitussin as needed. Start Tamiflu. Return here for any high fevers, difficulty breathing or any worsening condition. Otherwise followup in the clinic.

## 2013-09-29 NOTE — ED Provider Notes (Signed)
CSN: [id number]     Arrival date & time 09/28/13  1943 History   First MD Initiated Contact with Patient 09/29/13 0106     Chief Complaint  Patient presents with  . Chest Pain  . Migraine   (Consider location/radiation/quality/duration/timing/severity/associated sxs/prior Treatment) HPI History provided by patient. History of migraine headaches, presenting today with migraine and cough, congestion, sore throat, chest tightness.  She has felt like she's had a low-grade fever. No chills or body aches. No sick contacts at home. She was feeling short of breath earlier but denies any now. She's not a smoker. No wheezing. No rashes or recent travel. She did not get a flu shot this season.  Past Medical History  Diagnosis Date  . Neuromuscular disorder     fibromyalagia  . Back pain   . Gastritis   . Interstitial cystitis   . Endometrial cyst of ovary   . Anxiety     pt states she experiences panic attacks  . GERD (gastroesophageal reflux disease)   . Migraine   . Hernia, hiatal   . Fibroid   . Panic attack   . Panic attacks 01/09/2012  . Fibromyalgia   . Urinary tract infection   . Fibromyalgia   . PID (pelvic inflammatory disease)   . Trichomonas   . Depression     seeing a therapist  . Preterm labor     preterm contractions, delivered full term   Past Surgical History  Procedure Laterality Date  . Laparoscopy  06/18/2011    Procedure: LAPAROSCOPY DIAGNOSTIC;  Surgeon: Juliene Pina C. Hulan Fray, MD;  Location: [facility];  Service: Gynecology;  Laterality: Bilateral;  with bilateral chromopertubation with excision of endometrial biopsy  . Laparoscopic endometriosis fulguration  06/2011  . Cesarean section  04/06/2012    Procedure: CESAREAN SECTION;  Surgeon: Delice Lesch, MD;  Location: [facility];  Service: Gynecology;  Laterality: N/A;  Primary cesarean section of baby boy  at 47    Family History  Problem Relation Age of Onset  . Fibroids Mother   . Hypertension Mother   . Hypertension  Father   . Diabetes Father   . Kidney disease Father     kidney stones  . Gout Father   . Cancer Father     prostate  . Anesthesia problems Neg Hx   . Other Neg Hx    History  Substance Use Topics  . Smoking status: Never Smoker   . Smokeless tobacco: Never Used  . Alcohol Use: No   OB History   Grav Para Term Preterm Abortions TAB SAB Ect Mult Living   1 1 1  0 0 0 0 0 0 1     Review of Systems  Constitutional: Negative for diaphoresis and fatigue.  HENT: Negative for trouble swallowing and voice change.   Respiratory: Positive for cough.   Cardiovascular: Negative for leg swelling.  Gastrointestinal: Negative for vomiting and abdominal pain.  Genitourinary: Negative for dysuria.  Musculoskeletal: Negative for neck stiffness.  Skin: Negative for rash.  Neurological: Negative for headaches.  All other systems reviewed and are negative.    Allergies  Percocet and Sertraline  Home Medications   Current Outpatient Rx  Name  Route  Sig  Dispense  Refill  . aspirin-acetaminophen-caffeine (EXCEDRIN MIGRAINE) 250-250-65 MG per tablet   Oral   Take 2 tablets by mouth daily as needed (For pain.).         Marland Kitchen diphenhydrAMINE (BENADRYL) 25 MG tablet   Oral  Take 25-50 mg by mouth at bedtime as needed for allergies.         . diphenhydramine-acetaminophen (TYLENOL PM) 25-500 MG TABS   Oral   Take 2 tablets by mouth at bedtime as needed (For pain and sleep.).         Marland Kitchen fluticasone (FLONASE) 50 MCG/ACT nasal spray   Nasal   Place 2 sprays into the nose daily.   16 g   0   . ibuprofen (ADVIL,MOTRIN) 200 MG tablet   Oral   Take 600 mg by mouth every 6 (six) hours as needed for cramping.         Marland Kitchen oxyCODONE-acetaminophen (PERCOCET) 10-325 MG per tablet   Oral   Take 1 tablet by mouth every 4 (four) hours as needed for pain.         . simethicone (MYLICON) 086 MG chewable tablet   Oral   Chew 125 mg by mouth every 6 (six) hours as needed for flatulence.           BP 102/68  Pulse 78  Temp(Src) 97.9 F (36.6 C) (Oral)  Resp 20  SpO2 99%  LMP 09/04/2013 Physical Exam  Constitutional: She is oriented to person, place, and time. She appears well-developed and well-nourished.  HENT:  Head: Normocephalic and atraumatic.  Mouth/Throat: Oropharynx is clear and moist. No oropharyngeal exudate.  Eyes: EOM are normal. Pupils are equal, round, and reactive to light.  Neck: Neck supple.  Cardiovascular: Normal rate, regular rhythm and intact distal pulses.   Pulmonary/Chest: Effort normal and breath sounds normal. No respiratory distress. She exhibits no tenderness.  Abdominal: Soft. Bowel sounds are normal. She exhibits no distension. There is no tenderness.  Musculoskeletal: Normal range of motion. She exhibits no edema and no tenderness.  Neurological: She is alert and oriented to person, place, and time. No cranial nerve deficit. Coordination normal.  Skin: Skin is warm and dry. No rash noted.    ED Course  Procedures (including critical care time) Labs Review Labs Reviewed  CBC - Abnormal; Notable for the following:    Hemoglobin 9.5 (*)    HCT 30.4 (*)    MCV 72.2 (*)    MCH 22.6 (*)    RDW 16.8 (*)    All other components within normal limits  BASIC METABOLIC PANEL  POCT I-STAT TROPONIN I   Imaging Review Dg Chest 2 View  09/28/2013   CLINICAL DATA:  Chest pain.  EXAM: CHEST - 2 VIEW  COMPARISON:  DG CHEST 2 VIEW dated 02/18/2013  FINDINGS: The heart size and mediastinal contours are within normal limits. There is no evidence of pulmonary edema, consolidation, pneumothorax, nodule or pleural fluid. The visualized skeletal structures are unremarkable.  IMPRESSION: No active disease.   Electronically Signed   By: Aletta Edouard M.D.   On: 09/28/2013 21:09    EKG Interpretation    Date/Time:  Monday September 28 2013 20:00:39 EST Ventricular Rate:  104 PR Interval:  132 QRS Duration: 82 QT Interval:  334 QTC Calculation: 439 R  Axis:   84 Text Interpretation:  Sinus tachycardia Non-specific ST-t changes No significant change since last tracing Confirmed by [place] [phone]) on 09/29/2013 1:23:20 AM            IV fluids and headache cocktail provided. Tamiflu provided for flulike symptoms.  Plan discharge home with prescription for Motrin and Tamiflu and followup outpatient. Patient agrees to all discharge and followup instructions, in addition to  return precautions.  MDM  Diagnosis: Headache, URI  Flulike symptoms for 24 hours, also complaining of migraine without neuro deficits. Treated with IV fluids and migraine cocktail. Headache improving. Vital signs and nursing notes reviewed and considered   Teressa Lower, MD 09/29/13 [phone]

## 2013-10-28 ENCOUNTER — Ambulatory Visit (INDEPENDENT_AMBULATORY_CARE_PROVIDER_SITE_OTHER): Payer: No Typology Code available for payment source | Admitting: Obstetrics & Gynecology

## 2013-10-28 ENCOUNTER — Encounter: Payer: Self-pay | Admitting: Obstetrics & Gynecology

## 2013-10-28 VITALS — BP 102/74 | HR 97 | Temp 97.5°F | Ht 67.0 in | Wt 141.4 lb

## 2013-10-28 DIAGNOSIS — N939 Abnormal uterine and vaginal bleeding, unspecified: Secondary | ICD-10-CM

## 2013-10-28 DIAGNOSIS — Z30430 Encounter for insertion of intrauterine contraceptive device: Secondary | ICD-10-CM

## 2013-10-28 DIAGNOSIS — N926 Irregular menstruation, unspecified: Secondary | ICD-10-CM

## 2013-10-28 LAB — POCT PREGNANCY, URINE: Preg Test, Ur: NEGATIVE

## 2013-10-28 MED ORDER — LEVONORGESTREL 20 MCG/24HR IU IUD
INTRAUTERINE_SYSTEM | Freq: Once | INTRAUTERINE | Status: AC
  Administered 2013-10-28: 1 via INTRAUTERINE

## 2013-10-28 NOTE — Progress Notes (Signed)
   Subjective:    Patient ID: Meredith Howard, female    DOB: 06/13/1981, 33 y.o.   MRN: [id number]  HPI  33 yo P1 with endometriosis and menorrhagia and anemia is here today for a Mirena insertion. All questions were answered. She declines OCPs.  Review of Systems     Objective:   Physical Exam  UPT negative, consent signed, Time out procedure done. Cervix prepped with betadine and grasped with a single tooth tenaculum. Mirena was easily placed and the strings were cut to 3-4 cm. Uterus sounded to 8 cm. She tolerated the procedure well.       Assessment & Plan:   Menorrhagia/anemia/endometriosis- Mirena RTC 1 month for string check

## 2013-10-28 NOTE — Patient Instructions (Signed)
Levonorgestrel intrauterine device (IUD) What is this medicine? LEVONORGESTREL IUD (LEE voe nor jes trel) is a contraceptive (birth control) device. The device is placed inside the uterus by a healthcare professional. It is used to prevent pregnancy and can also be used to treat heavy bleeding that occurs during your period. Depending on the device, it can be used for 3 to 5 years. This medicine may be used for other purposes; ask your health care provider or pharmacist if you have questions. COMMON BRAND NAME(S): Mirena, Skyla What should I tell my health care provider before I take this medicine? They need to know if you have any of these conditions: -abnormal Pap smear -cancer of the breast, uterus, or cervix -diabetes -endometritis -genital or pelvic infection now or in the past -have more than one sexual partner or your partner has more than one partner -heart disease -history of an ectopic or tubal pregnancy -immune system problems -IUD in place -liver disease or tumor -problems with blood clots or take blood-thinners -use intravenous drugs -uterus of unusual shape -vaginal bleeding that has not been explained -an unusual or allergic reaction to levonorgestrel, other hormones, silicone, or polyethylene, medicines, foods, dyes, or preservatives -pregnant or trying to get pregnant -breast-feeding How should I use this medicine? This device is placed inside the uterus by a health care professional. Talk to your pediatrician regarding the use of this medicine in children. Special care may be needed. Overdosage: If you think you have taken too much of this medicine contact a poison control center or emergency room at once. NOTE: This medicine is only for you. Do not share this medicine with others. What if I miss a dose? This does not apply. What may interact with this medicine? Do not take this medicine with any of the following  medications: -amprenavir -bosentan -fosamprenavir This medicine may also interact with the following medications: -aprepitant -barbiturate medicines for inducing sleep or treating seizures -bexarotene -griseofulvin -medicines to treat seizures like carbamazepine, ethotoin, felbamate, oxcarbazepine, phenytoin, topiramate -modafinil -pioglitazone -rifabutin -rifampin -rifapentine -some medicines to treat HIV infection like atazanavir, indinavir, lopinavir, nelfinavir, tipranavir, ritonavir -St. John's wort -warfarin This list may not describe all possible interactions. Give your health care provider a list of all the medicines, herbs, non-prescription drugs, or dietary supplements you use. Also tell them if you smoke, drink alcohol, or use illegal drugs. Some items may interact with your medicine. What should I watch for while using this medicine? Visit your doctor or health care professional for regular check ups. See your doctor if you or your partner has sexual contact with others, becomes HIV positive, or gets a sexual transmitted disease. This product does not protect you against HIV infection (AIDS) or other sexually transmitted diseases. You can check the placement of the IUD yourself by reaching up to the top of your vagina with clean fingers to feel the threads. Do not pull on the threads. It is a good habit to check placement after each menstrual period. Call your doctor right away if you feel more of the IUD than just the threads or if you cannot feel the threads at all. The IUD may come out by itself. You may become pregnant if the device comes out. If you notice that the IUD has come out use a backup birth control method like condoms and call your health care provider. Using tampons will not change the position of the IUD and are okay to use during your period. What side effects may I   notice from receiving this medicine? Side effects that you should report to your doctor or  health care professional as soon as possible: -allergic reactions like skin rash, itching or hives, swelling of the face, lips, or tongue -fever, flu-like symptoms -genital sores -high blood pressure -no menstrual period for 6 weeks during use -pain, swelling, warmth in the leg -pelvic pain or tenderness -severe or sudden headache -signs of pregnancy -stomach cramping -sudden shortness of breath -trouble with balance, talking, or walking -unusual vaginal bleeding, discharge -yellowing of the eyes or skin Side effects that usually do not require medical attention (report to your doctor or health care professional if they continue or are bothersome): -acne -breast pain -change in sex drive or performance -changes in weight -cramping, dizziness, or faintness while the device is being inserted -headache -irregular menstrual bleeding within first 3 to 6 months of use -nausea This list may not describe all possible side effects. Call your doctor for medical advice about side effects. You may report side effects to FDA at 1-800-FDA-1088. Where should I keep my medicine? This does not apply. NOTE: This sheet is a summary. It may not cover all possible information. If you have questions about this medicine, talk to your doctor, pharmacist, or health care provider.  2014, [employer]/Gold Standard. (2011-09-20 13:54:04)  

## 2013-11-23 ENCOUNTER — Encounter: Payer: Self-pay | Admitting: *Deleted

## 2013-11-30 ENCOUNTER — Telehealth: Payer: Self-pay

## 2013-11-30 NOTE — Telephone Encounter (Signed)
Pt stated that she had the IUD inserted just recently and that she has been having heavy bleeding and clot since, is it normal?

## 2013-12-01 NOTE — Telephone Encounter (Signed)
Called pt. Who stated she was bleeding after having the mirena placed, it stopped for 5-6 days and then started back up again. When asked about amount of bleeding pt. states when she bleeds, she bleeds "a cup into the toilet" during urination but does not actually have much blood soak her pad if she wears a pad, however then states that she sometimes goes through a tampon an hour when she wears a tampon. Pt. Has follow up appointment the 8th; informed pt. To keep this appointment. Informed her I am not concerned with amount of bleeding at this time; however, if she soaks 1 pad an hour or more, feels dizzy/SOB or lightheaded then she should come to the MAU for examination. Informed pt. She should also continue to take her iron tablet daily. Pt. Verbalized understanding and had no further questions or concerns.

## 2013-12-08 ENCOUNTER — Inpatient Hospital Stay (HOSPITAL_COMMUNITY)
Admission: AD | Admit: 2013-12-08 | Discharge: 2013-12-08 | Disposition: A | Discharge status: Home / Self Care | Payer: No Typology Code available for payment source | Source: Ambulatory Visit | Attending: Family Medicine | Admitting: Family Medicine

## 2013-12-08 ENCOUNTER — Inpatient Hospital Stay (HOSPITAL_COMMUNITY): Payer: No Typology Code available for payment source

## 2013-12-08 ENCOUNTER — Encounter (HOSPITAL_COMMUNITY): Payer: Self-pay | Admitting: *Deleted

## 2013-12-08 DIAGNOSIS — R110 Nausea: Secondary | ICD-10-CM | POA: Insufficient documentation

## 2013-12-08 DIAGNOSIS — R109 Unspecified abdominal pain: Secondary | ICD-10-CM

## 2013-12-08 DIAGNOSIS — R1032 Left lower quadrant pain: Secondary | ICD-10-CM | POA: Insufficient documentation

## 2013-12-08 DIAGNOSIS — Z975 Presence of (intrauterine) contraceptive device: Secondary | ICD-10-CM | POA: Insufficient documentation

## 2013-12-08 DIAGNOSIS — IMO0001 Reserved for inherently not codable concepts without codable children: Secondary | ICD-10-CM | POA: Insufficient documentation

## 2013-12-08 DIAGNOSIS — F3289 Other specified depressive episodes: Secondary | ICD-10-CM | POA: Insufficient documentation

## 2013-12-08 DIAGNOSIS — M549 Dorsalgia, unspecified: Secondary | ICD-10-CM | POA: Insufficient documentation

## 2013-12-08 DIAGNOSIS — F410 Panic disorder [episodic paroxysmal anxiety] without agoraphobia: Secondary | ICD-10-CM | POA: Insufficient documentation

## 2013-12-08 DIAGNOSIS — F329 Major depressive disorder, single episode, unspecified: Secondary | ICD-10-CM | POA: Insufficient documentation

## 2013-12-08 DIAGNOSIS — K219 Gastro-esophageal reflux disease without esophagitis: Secondary | ICD-10-CM | POA: Insufficient documentation

## 2013-12-08 DIAGNOSIS — F411 Generalized anxiety disorder: Secondary | ICD-10-CM | POA: Insufficient documentation

## 2013-12-08 LAB — COMPREHENSIVE METABOLIC PANEL
ALT: 9 U/L (ref 0–35)
AST: 15 U/L (ref 0–37)
Albumin: 4 g/dL (ref 3.5–5.2)
Alkaline Phosphatase: 56 U/L (ref 39–117)
BILIRUBIN TOTAL: 0.3 mg/dL (ref 0.3–1.2)
BUN: 9 mg/dL (ref 6–23)
CHLORIDE: 100 meq/L (ref 96–112)
CO2: 29 mEq/L (ref 19–32)
Calcium: 9.7 mg/dL (ref 8.4–10.5)
Creatinine, Ser: 0.72 mg/dL (ref 0.50–1.10)
GFR calc Af Amer: >90 mL/min (ref >90)
GFR calc non Af Amer: >90 mL/min (ref >90)
Glucose, Bld: 86 mg/dL (ref 70–99)
Potassium: 4.6 mEq/L (ref 3.7–5.3)
Sodium: 139 mEq/L (ref 137–147)
TOTAL PROTEIN: 7.6 g/dL (ref 6.0–8.3)

## 2013-12-08 LAB — URINALYSIS, ROUTINE W REFLEX MICROSCOPIC
Bilirubin Urine: NEGATIVE
GLUCOSE, UA: NEGATIVE mg/dL
Ketones, ur: NEGATIVE mg/dL
LEUKOCYTES UA: NEGATIVE
Nitrite: NEGATIVE
PROTEIN: NEGATIVE mg/dL
Specific Gravity, Urine: 1.015 (ref 1.005–1.030)
Urobilinogen, UA: 0.2 mg/dL (ref 0.0–1.0)
pH: 8 (ref 5.0–8.0)

## 2013-12-08 LAB — CBC
HEMATOCRIT: 29.3 % — AB (ref 36.0–46.0)
Hemoglobin: 8.8 g/dL — ABNORMAL LOW (ref 12.0–15.0)
MCH: 22 pg — AB (ref 26.0–34.0)
MCHC: 30 g/dL (ref 30.0–36.0)
MCV: 73.3 fL — ABNORMAL LOW (ref 78.0–100.0)
Platelets: 343 10*3/uL (ref 150–400)
RBC: 4 MIL/uL (ref 3.87–5.11)
RDW: 18.6 % — AB (ref 11.5–15.5)
WBC: 3.2 10*3/uL — ABNORMAL LOW (ref 4.0–10.5)

## 2013-12-08 LAB — URINE MICROSCOPIC-ADD ON

## 2013-12-08 MED ORDER — CYCLOBENZAPRINE HCL 10 MG PO TABS: 10.0000 mg | ORAL_TABLET | Freq: Two times a day (BID) | ORAL | Status: DC | PRN

## 2013-12-08 MED ORDER — ONDANSETRON 8 MG PO TBDP
8.0000 mg | ORAL_TABLET | Freq: Once | ORAL | Status: AC
  Administered 2013-12-08: 8 mg via ORAL
  Filled 2013-12-08: qty 1

## 2013-12-08 MED ORDER — IOHEXOL 300 MG/ML  SOLN
100.0000 mL | Freq: Once | INTRAMUSCULAR | Status: AC | PRN
  Administered 2013-12-08: 100 mL via INTRAVENOUS

## 2013-12-08 MED ORDER — KETOROLAC TROMETHAMINE 60 MG/2ML IM SOLN
60.0000 mg | Freq: Once | INTRAMUSCULAR | Status: AC
  Administered 2013-12-08: 60 mg via INTRAMUSCULAR
  Filled 2013-12-08: qty 2

## 2013-12-08 MED ORDER — IOHEXOL 300 MG/ML  SOLN
50.0000 mL | INTRAMUSCULAR | Status: AC
  Administered 2013-12-08: 50 mL via INTRAVENOUS

## 2013-12-08 NOTE — MAU Provider Note (Signed)
History     CSN: [id number]  Arrival date and time: 12/08/13 1219   First Provider Initiated Contact with Patient 12/08/13 1319      Chief Complaint  Patient presents with  . Abdominal Pain   HPI  Pt is not pregnant and presents with left lower quadrant pain. Pt radiates down left leg.  Pt has never experienced this type of pain before.  Pt took Tramadol This afternoon without relief of pain.  Pt presents with tears.  Pt states it hurts to lay on that side, otherwise, movement does not influence pain. Pt has never had this type of pain before.  Pt had Mirena IUD inserted by Dr. Hulan Fray about 6 weeks ago.  Pt has had bleeding But had onset of sudden left lower quadrant pain that she has never had  Before.  Pt has IBS and is on hycosamine.   Pt is to have upper GI and colonoscopy in 2 weeks. RN note: Mindi Slicker, RN Registered Nurse Signed  MAU Note Service date: 12/08/2013 12:25 PM   Patient presents to MAU with c/o of sharp left lower abdominal pain that started yesterday and has intensified. Patient taken from lobby to room via wheelchair.       Past Medical History  Diagnosis Date  . Neuromuscular disorder     fibromyalagia  . Back pain   . Gastritis   . Interstitial cystitis   . Endometrial cyst of ovary   . Anxiety     pt states she experiences panic attacks  . GERD (gastroesophageal reflux disease)   . Migraine   . Hernia, hiatal   . Fibroid   . Panic attack   . Panic attacks 01/09/2012  . Fibromyalgia   . Urinary tract infection   . Fibromyalgia   . PID (pelvic inflammatory disease)   . Trichomonas   . Depression     seeing a therapist  . Preterm labor     preterm contractions, delivered full term    Past Surgical History  Procedure Laterality Date  . Laparoscopy  06/18/2011    Procedure: LAPAROSCOPY DIAGNOSTIC;  Surgeon: Juliene Pina C. Hulan Fray, MD;  Location: [facility];  Service: Gynecology;  Laterality: Bilateral;  with bilateral chromopertubation with excision  of endometrial biopsy  . Laparoscopic endometriosis fulguration  06/2011  . Cesarean section  04/06/2012    Procedure: CESAREAN SECTION;  Surgeon: Delice Lesch, MD;  Location: [facility];  Service: Gynecology;  Laterality: N/A;  Primary cesarean section of baby boy  at 57     Family History  Problem Relation Age of Onset  . Fibroids Mother   . Hypertension Mother   . Hypertension Father   . Diabetes Father   . Kidney disease Father     kidney stones  . Gout Father   . Cancer Father     prostate  . Anesthesia problems Neg Hx   . Other Neg Hx     History  Substance Use Topics  . Smoking status: Never Smoker   . Smokeless tobacco: Never Used  . Alcohol Use: No    Allergies:  Allergies  Allergen Reactions  . Benadryl [Diphenhydramine] Other (See Comments)    Injection form: Restless, agitation  . Percocet [Oxycodone-Acetaminophen] Itching  . Sertraline Other (See Comments)    Dizzy, headache, felt light-headed    Prescriptions prior to admission  Medication Sig Dispense Refill  . amitriptyline (ELAVIL) 50 MG tablet Take 50 mg by mouth at bedtime.      Marland Kitchen  ferrous fumarate (HEMOCYTE - 106 MG FE) 325 (106 FE) MG TABS tablet Take 1 tablet by mouth daily.      . fluticasone (FLONASE) 50 MCG/ACT nasal spray Place 1 spray into both nostrils daily as needed for allergies or rhinitis.      . hyoscyamine (LEVSIN SL) 0.125 MG SL tablet Place 0.125 mg under the tongue every 4 (four) hours as needed for cramping.      Marland Kitchen levonorgestrel (MIRENA) 20 MCG/24HR IUD 1 each by Intrauterine route once.      Marland Kitchen omeprazole (PRILOSEC) 20 MG capsule Take 20 mg by mouth daily.      . simethicone (MYLICON) 371 MG chewable tablet Chew 125 mg by mouth every 6 (six) hours as needed for flatulence.      . traMADol (ULTRAM) 50 MG tablet Take 50 mg by mouth daily as needed for moderate pain.        Review of Systems  Constitutional: Negative for fever and chills.  Gastrointestinal: Positive for nausea  and abdominal pain. Negative for vomiting and diarrhea.  Genitourinary: Negative for dysuria.  Musculoskeletal: Positive for back pain.   Physical Exam   Blood pressure 132/84, pulse 112, temperature 98.2 F (36.8 C), temperature source Oral, resp. rate 18, height 5\' 7"  (1.702 m), weight 63.957 kg (141 lb), SpO2 100.00%, not currently breastfeeding.  Physical Exam  Nursing note and vitals reviewed. Constitutional: She is oriented to person, place, and time. She appears well-developed and well-nourished.  Uncomfortable appearing  HENT:  Head: Normocephalic.  Eyes: Pupils are equal, round, and reactive to light.  Neck: Normal range of motion. Neck supple.  Respiratory: Effort normal.  GI: Soft. She exhibits no distension. There is tenderness. There is no rebound and no guarding.  Musculoskeletal: Normal range of motion.  Neurological: She is alert and oriented to person, place, and time.  Skin: Skin is warm and dry.  Psychiatric: She has a normal mood and affect.    MAU Course  Procedures Toradol 60mg  IM given with improvement in pain Results for orders placed during the hospital encounter of 12/08/13 (from the past 24 hour(s))  COMPREHENSIVE METABOLIC PANEL     Status: None   Collection Time    12/08/13  1:30 PM      Result Value Ref Range   Sodium 139  137 - 147 mEq/L   Potassium 4.6  3.7 - 5.3 mEq/L   Chloride 100  96 - 112 mEq/L   CO2 29  19 - 32 mEq/L   Glucose, Bld 86  70 - 99 mg/dL   BUN 9  6 - 23 mg/dL   Creatinine, Ser 0.72  0.50 - 1.10 mg/dL   Calcium 9.7  8.4 - 10.5 mg/dL   Total Protein 7.6  6.0 - 8.3 g/dL   Albumin 4.0  3.5 - 5.2 g/dL   AST 15  0 - 37 U/L   ALT 9  0 - 35 U/L   Alkaline Phosphatase 56  39 - 117 U/L   Total Bilirubin 0.3  0.3 - 1.2 mg/dL   GFR calc non Af Amer >90  >90 mL/min   GFR calc Af Amer >90  >90 mL/min  URINALYSIS, ROUTINE W REFLEX MICROSCOPIC     Status: Abnormal   Collection Time    12/08/13  1:30 PM      Result Value Ref Range    Color, Urine YELLOW  YELLOW   APPearance CLEAR  CLEAR   Specific Gravity, Urine 1.015  1.005 - 1.030   pH 8.0  5.0 - 8.0   Glucose, UA NEGATIVE  NEGATIVE mg/dL   Hgb urine dipstick LARGE (*) NEGATIVE   Bilirubin Urine NEGATIVE  NEGATIVE   Ketones, ur NEGATIVE  NEGATIVE mg/dL   Protein, ur NEGATIVE  NEGATIVE mg/dL   Urobilinogen, UA 0.2  0.0 - 1.0 mg/dL   Nitrite NEGATIVE  NEGATIVE   Leukocytes, UA NEGATIVE  NEGATIVE  URINE MICROSCOPIC-ADD ON     Status: Abnormal   Collection Time    12/08/13  1:30 PM      Result Value Ref Range   Squamous Epithelial / LPF FEW (*) RARE   WBC, UA 0-2  <3 WBC/hpf   RBC / HPF 0-2  <3 RBC/hpf   Bacteria, UA FEW (*) RARE  CBC     Status: Abnormal   Collection Time    12/08/13  1:31 PM      Result Value Ref Range   WBC 3.2 (*) 4.0 - 10.5 K/uL   RBC 4.00  3.87 - 5.11 MIL/uL   Hemoglobin 8.8 (*) 12.0 - 15.0 g/dL   HCT 29.3 (*) 36.0 - 46.0 %   MCV 73.3 (*) 78.0 - 100.0 fL   MCH 22.0 (*) 26.0 - 34.0 pg   MCHC 30.0  30.0 - 36.0 g/dL   RDW 18.6 (*) 11.5 - 15.5 %   Platelets 343  150 - 400 K/uL   US Transvaginal Non-ob  12/08/2013   CLINICAL DATA:  Pelvic pain.  Recent placement of IUD.  EXAM: TRANSABDOMINAL AND TRANSVAGINAL ULTRASOUND OF PELVIS  TECHNIQUE: Both transabdominal and transvaginal ultrasound examinations of the pelvis were performed. Transabdominal technique was performed for global imaging of the pelvis including uterus, ovaries, adnexal regions, and pelvic cul-de-sac. It was necessary to proceed with endovaginal exam following the transabdominal exam to visualize the uterus, endometrium, ovaries and adnexa .  COMPARISON:  None  FINDINGS: Uterus  Measurements: 9.0 x 4.8 x 5.7 cm. Small anterior intramural fibroid measures 11 mm.  Endometrium  Thickness: 4 mm in thickness. IUD is noted within the endometrial canal.  Right ovary  Measurements: 2.5 x 2.0 x 2.1 cm. Normal appearance/no adnexal mass.  Left ovary  Measurements: 4.0 x 1.7 x 1.8 cm.  Normal appearance/no adnexal mass.  Other findings  No free fluid.  IMPRESSION: IUD noted within the endometrial canal.  No acute findings.   Electronically Signed   By: Rolm Baptise M.D.   On: 12/08/2013 14:28   US Pelvis Complete  12/08/2013   CLINICAL DATA:  Pelvic pain.  Recent placement of IUD.  EXAM: TRANSABDOMINAL AND TRANSVAGINAL ULTRASOUND OF PELVIS  TECHNIQUE: Both transabdominal and transvaginal ultrasound examinations of the pelvis were performed. Transabdominal technique was performed for global imaging of the pelvis including uterus, ovaries, adnexal regions, and pelvic cul-de-sac. It was necessary to proceed with endovaginal exam following the transabdominal exam to visualize the uterus, endometrium, ovaries and adnexa .  COMPARISON:  None  FINDINGS: Uterus  Measurements: 9.0 x 4.8 x 5.7 cm. Small anterior intramural fibroid measures 11 mm.  Endometrium  Thickness: 4 mm in thickness. IUD is noted within the endometrial canal.  Right ovary  Measurements: 2.5 x 2.0 x 2.1 cm. Normal appearance/no adnexal mass.  Left ovary  Measurements: 4.0 x 1.7 x 1.8 cm. Normal appearance/no adnexal mass.  Other findings  No free fluid.  IMPRESSION: IUD noted within the endometrial canal.  No acute findings.   Electronically Signed   By: Lennette Bihari  [place] M.D.   On: 12/08/2013 14:28   Ct Abdomen Pelvis W Contrast  12/08/2013   CLINICAL DATA:  Left lower quadrant pain radiating into left leg for 24 hours. History of endometriosis.  EXAM: CT ABDOMEN AND PELVIS WITH CONTRAST  TECHNIQUE: Multidetector CT imaging of the abdomen and pelvis was performed using the standard protocol following bolus administration of intravenous contrast.  CONTRAST:  100 mL OMNIPAQUE IOHEXOL 300 MG/ML  SOLN.  COMPARISON:  [facility]  FINDINGS: The lung bases are clear no pleural or pericardial effusion.  The gallbladder, liver, kidneys, adrenal glands, spleen, pancreas and biliary tree are unremarkable. IUD is in place in the uterus. The uterus  is otherwise unremarkable. Adnexa and urinary bladder appear normal. Small amount of free pelvic fluid is compatible with physiologic change. Umbilical hernia is again seen and measures 2.7 cm transverse by 4.1 cm craniocaudal. The hernia contains loops of small bowel without obstruction or other complicating feature. There is no lymphadenopathy. The stomach and small and large bowel appear normal. The appendix appears normal. No focal bony abnormality is identified.  IMPRESSION: No acute finding or finding to explain the patient's symptoms.  Umbilical hernia contains small bowel without obstruction or other complicating feature.   Electronically Signed   By: Inge Rise M.D.   On: 12/08/2013 17:09  CLINICAL DATA: Left lower quadrant pain radiating into left leg for  24 hours. History of endometriosis.  EXAM:  CT ABDOMEN AND PELVIS WITH CONTRAST  TECHNIQUE:  Multidetector CT imaging of the abdomen and pelvis was performed  using the standard protocol following bolus administration of  intravenous contrast.  CONTRAST: 100 mL OMNIPAQUE IOHEXOL 300 MG/ML SOLN.  COMPARISON: [facility]  FINDINGS:  The lung bases are clear no pleural or pericardial effusion.  The gallbladder, liver, kidneys, adrenal glands, spleen, pancreas  and biliary tree are unremarkable. IUD is in place in the uterus.  The uterus is otherwise unremarkable. Adnexa and urinary bladder  appear normal. Small amount of free pelvic fluid is compatible with  physiologic change. Umbilical hernia is again seen and measures 2.7  cm transverse by 4.1 cm craniocaudal. The hernia contains loops of  small bowel without obstruction or other complicating feature. There  is no lymphadenopathy. The stomach and small and large bowel appear  normal. The appendix appears normal. No focal bony abnormality is  identified.  IMPRESSION:  No acute finding or finding to explain the patient's symptoms.  Umbilical hernia contains small bowel  without obstruction or other  complicating feature.  Electronically Signed  By:  Discussed with Dr. Nehemiah Settle Assessment and Plan  Left lower quadrant pain IUD in place Normal pelvic ultrasound and CT of abdomen and pelvis Flexeril 10mg  TID for pain F/u with primary Care provider for further evaluation  Muhanad Torosyan 12/08/2013, 1:20 PM

## 2013-12-08 NOTE — MAU Note (Signed)
Patient presents to MAU with c/o of sharp left lower abdominal pain that started yesterday and has intensified. Patient taken from lobby to room via wheelchair.

## 2013-12-09 ENCOUNTER — Ambulatory Visit: Payer: No Typology Code available for payment source | Admitting: Obstetrics & Gynecology

## 2013-12-09 NOTE — MAU Provider Note (Signed)
Attestation of Attending Supervision of Advanced Practitioner (PA/CNM/NP): Evaluation and management procedures were performed by the Advanced Practitioner under my supervision and collaboration.  I have reviewed the Advanced Practitioner's note and chart, and I agree with the management and plan.  Jacob Stinson, DO Attending Physician Faculty Practice, [facility]  

## 2013-12-12 ENCOUNTER — Emergency Department (HOSPITAL_COMMUNITY): Payer: No Typology Code available for payment source

## 2013-12-12 ENCOUNTER — Encounter (HOSPITAL_COMMUNITY): Payer: Self-pay | Admitting: Emergency Medicine

## 2013-12-12 ENCOUNTER — Emergency Department (HOSPITAL_COMMUNITY)
Admission: EM | Admit: 2013-12-12 | Discharge: 2013-12-12 | Disposition: A | Discharge status: Home / Self Care | Payer: No Typology Code available for payment source | Attending: Emergency Medicine | Admitting: Emergency Medicine

## 2013-12-12 DIAGNOSIS — T839XXA Unspecified complication of genitourinary prosthetic device, implant and graft, initial encounter: Secondary | ICD-10-CM

## 2013-12-12 DIAGNOSIS — F329 Major depressive disorder, single episode, unspecified: Secondary | ICD-10-CM | POA: Insufficient documentation

## 2013-12-12 DIAGNOSIS — Z87440 Personal history of urinary (tract) infections: Secondary | ICD-10-CM | POA: Insufficient documentation

## 2013-12-12 DIAGNOSIS — G43909 Migraine, unspecified, not intractable, without status migrainosus: Secondary | ICD-10-CM | POA: Insufficient documentation

## 2013-12-12 DIAGNOSIS — K219 Gastro-esophageal reflux disease without esophagitis: Secondary | ICD-10-CM | POA: Insufficient documentation

## 2013-12-12 DIAGNOSIS — Z8619 Personal history of other infectious and parasitic diseases: Secondary | ICD-10-CM | POA: Insufficient documentation

## 2013-12-12 DIAGNOSIS — Z79899 Other long term (current) drug therapy: Secondary | ICD-10-CM | POA: Insufficient documentation

## 2013-12-12 DIAGNOSIS — F3289 Other specified depressive episodes: Secondary | ICD-10-CM | POA: Insufficient documentation

## 2013-12-12 DIAGNOSIS — F411 Generalized anxiety disorder: Secondary | ICD-10-CM | POA: Insufficient documentation

## 2013-12-12 DIAGNOSIS — Y831 Surgical operation with implant of artificial internal device as the cause of abnormal reaction of the patient, or of later complication, without mention of misadventure at the time of the procedure: Secondary | ICD-10-CM | POA: Insufficient documentation

## 2013-12-12 DIAGNOSIS — T8389XA Other specified complication of genitourinary prosthetic devices, implants and grafts, initial encounter: Secondary | ICD-10-CM | POA: Insufficient documentation

## 2013-12-12 DIAGNOSIS — N949 Unspecified condition associated with female genital organs and menstrual cycle: Secondary | ICD-10-CM | POA: Insufficient documentation

## 2013-12-12 DIAGNOSIS — R110 Nausea: Secondary | ICD-10-CM | POA: Insufficient documentation

## 2013-12-12 DIAGNOSIS — R300 Dysuria: Secondary | ICD-10-CM | POA: Insufficient documentation

## 2013-12-12 DIAGNOSIS — R102 Pelvic and perineal pain: Secondary | ICD-10-CM

## 2013-12-12 DIAGNOSIS — R42 Dizziness and giddiness: Secondary | ICD-10-CM | POA: Insufficient documentation

## 2013-12-12 DIAGNOSIS — N898 Other specified noninflammatory disorders of vagina: Secondary | ICD-10-CM | POA: Insufficient documentation

## 2013-12-12 DIAGNOSIS — K921 Melena: Secondary | ICD-10-CM | POA: Insufficient documentation

## 2013-12-12 DIAGNOSIS — Z3202 Encounter for pregnancy test, result negative: Secondary | ICD-10-CM | POA: Insufficient documentation

## 2013-12-12 LAB — COMPREHENSIVE METABOLIC PANEL
ALT: 10 U/L (ref 0–35)
AST: 17 U/L (ref 0–37)
Albumin: 4 g/dL (ref 3.5–5.2)
Alkaline Phosphatase: 56 U/L (ref 39–117)
BUN: 8 mg/dL (ref 6–23)
CALCIUM: 9.5 mg/dL (ref 8.4–10.5)
CO2: 24 meq/L (ref 19–32)
CREATININE: 0.7 mg/dL (ref 0.50–1.10)
Chloride: 97 mEq/L (ref 96–112)
GFR calc Af Amer: >90 mL/min (ref >90)
Glucose, Bld: 92 mg/dL (ref 70–99)
Potassium: 3.8 mEq/L (ref 3.7–5.3)
Sodium: 137 mEq/L (ref 137–147)
Total Bilirubin: <0.2 mg/dL — ABNORMAL LOW (ref 0.3–1.2)
Total Protein: 8 g/dL (ref 6.0–8.3)

## 2013-12-12 LAB — CBC WITH DIFFERENTIAL/PLATELET
Basophils Absolute: 0 10*3/uL (ref 0.0–0.1)
Basophils Relative: 0 % (ref 0–1)
Eosinophils Absolute: 0 10*3/uL (ref 0.0–0.7)
Eosinophils Relative: 1 % (ref 0–5)
HCT: 30.2 % — ABNORMAL LOW (ref 36.0–46.0)
Hemoglobin: 9.4 g/dL — ABNORMAL LOW (ref 12.0–15.0)
LYMPHS ABS: 1.5 10*3/uL (ref 0.7–4.0)
LYMPHS PCT: 38 % (ref 12–46)
MCH: 22.7 pg — AB (ref 26.0–34.0)
MCHC: 31.1 g/dL (ref 30.0–36.0)
MCV: 72.8 fL — ABNORMAL LOW (ref 78.0–100.0)
MONOS PCT: 8 % (ref 3–12)
Monocytes Absolute: 0.3 10*3/uL (ref 0.1–1.0)
NEUTROS PCT: 53 % (ref 43–77)
Neutro Abs: 2.2 10*3/uL (ref 1.7–7.7)
Platelets: 362 10*3/uL (ref 150–400)
RBC: 4.15 MIL/uL (ref 3.87–5.11)
RDW: 18.1 % — ABNORMAL HIGH (ref 11.5–15.5)
WBC: 4.1 10*3/uL (ref 4.0–10.5)

## 2013-12-12 LAB — URINALYSIS, ROUTINE W REFLEX MICROSCOPIC
Glucose, UA: NEGATIVE mg/dL
HGB URINE DIPSTICK: NEGATIVE
Ketones, ur: NEGATIVE mg/dL
LEUKOCYTES UA: NEGATIVE
Nitrite: NEGATIVE
PH: 5.5 (ref 5.0–8.0)
Protein, ur: NEGATIVE mg/dL
SPECIFIC GRAVITY, URINE: 1.025 (ref 1.005–1.030)
Urobilinogen, UA: 0.2 mg/dL (ref 0.0–1.0)

## 2013-12-12 LAB — PREGNANCY, URINE: Preg Test, Ur: NEGATIVE

## 2013-12-12 LAB — LIPASE, BLOOD: Lipase: 34 U/L (ref 11–59)

## 2013-12-12 LAB — POC OCCULT BLOOD, ED: Fecal Occult Bld: NEGATIVE

## 2013-12-12 LAB — WET PREP, GENITAL
CLUE CELLS WET PREP: NONE SEEN
Trich, Wet Prep: NONE SEEN
YEAST WET PREP: NONE SEEN

## 2013-12-12 MED ORDER — LIDOCAINE HCL (PF) 1 % IJ SOLN
INTRAMUSCULAR | Status: AC
  Administered 2013-12-12: 1 mL
  Filled 2013-12-12: qty 5

## 2013-12-12 MED ORDER — AZITHROMYCIN 250 MG PO TABS
1000.0000 mg | ORAL_TABLET | Freq: Once | ORAL | Status: AC
  Administered 2013-12-12: 1000 mg via ORAL
  Filled 2013-12-12: qty 4

## 2013-12-12 MED ORDER — MORPHINE SULFATE 4 MG/ML IJ SOLN
4.0000 mg | Freq: Once | INTRAMUSCULAR | Status: AC
  Administered 2013-12-12: 4 mg via INTRAVENOUS
  Filled 2013-12-12: qty 1

## 2013-12-12 MED ORDER — SODIUM CHLORIDE 0.9 % IV BOLUS (SEPSIS)
1000.0000 mL | Freq: Once | INTRAVENOUS | Status: AC
  Administered 2013-12-12: 1000 mL via INTRAVENOUS

## 2013-12-12 MED ORDER — KETOROLAC TROMETHAMINE 15 MG/ML IJ SOLN
15.0000 mg | Freq: Once | INTRAMUSCULAR | Status: AC
  Administered 2013-12-12: 15 mg via INTRAVENOUS
  Filled 2013-12-12: qty 1

## 2013-12-12 MED ORDER — NAPROXEN 375 MG PO TABS: 375.0000 mg | ORAL_TABLET | Freq: Two times a day (BID) | ORAL | Status: DC

## 2013-12-12 MED ORDER — ONDANSETRON HCL 4 MG/2ML IJ SOLN
4.0000 mg | Freq: Once | INTRAMUSCULAR | Status: AC
  Administered 2013-12-12: 4 mg via INTRAVENOUS
  Filled 2013-12-12: qty 2

## 2013-12-12 MED ORDER — CEFTRIAXONE SODIUM 250 MG IJ SOLR
250.0000 mg | Freq: Once | INTRAMUSCULAR | Status: AC
  Administered 2013-12-12: 250 mg via INTRAMUSCULAR
  Filled 2013-12-12: qty 250

## 2013-12-12 NOTE — ED Notes (Signed)
Darl Householder, MD and Shan Levans, [place] at bedside.

## 2013-12-12 NOTE — ED Notes (Signed)
Pt in Ultrasound

## 2013-12-12 NOTE — Discharge Instructions (Signed)
Please call your doctor for a followup appointment within 24-48 hours. When you talk to your doctor please let them know that you were seen in the emergency department and have them acquire all of your records so that they can discuss the findings with you and formulate a treatment plan to fully care for your new and ongoing problems. Please call and set-up an appointment to be re-assessed by Dr. Hulan Fray, OBGYN within 48 hours Please rest and stay hydrated Please take medications as prescribed Please avoid any physical or strenuous activity IUD has been removed, but there can still be vaginal bleeding and cramping - please monitor closely Please continue to monitor symptoms and if symptoms are to worsen or change (fever greater than 101, chills, sweats, chest pain, shortness of breath, difficulty breathing, suprapubic pain, pelvic pain, take discharge, vaginal pain, vaginal burning, pain with urination, thick clots, blood in stools, laboratory stools, dizziness, weakness, fainting) please report back to the ED immediately  Intrauterine Device Information An intrauterine device (IUD) is inserted into your uterus to prevent pregnancy. There are two types of IUDs available:   Copper IUD This type of IUD is wrapped in copper wire and is placed inside the uterus. Copper makes the uterus and fallopian tubes produce a fluid that kills sperm. The copper IUD can stay in place for 10 years.  Hormone IUD This type of IUD contains the hormone progestin (synthetic progesterone). The hormone thickens the cervical mucus and prevents sperm from entering the uterus. It also thins the uterine lining to prevent implantation of a fertilized egg. The hormone can weaken or kill the sperm that get into the uterus. One type of hormone IUD can stay in place for 5 years, and another type can stay in place for 3 years. Your health care provider will make sure you are a good candidate for a contraceptive IUD. Discuss with your  health care provider the possible side effects.  ADVANTAGES OF AN INTRAUTERINE DEVICE  IUDs are highly effective, reversible, long acting, and low maintenance.   There are no estrogen-related side effects.   An IUD can be used when breastfeeding.   IUDs are not associated with weight gain.   The copper IUD works immediately after insertion.   The hormone IUD works right away if inserted within 7 days of your period starting. You will need to use a backup method of birth control for 7 days if the hormone IUD is inserted at any other time in your cycle.  The copper IUD does not interfere with your female hormones.   The hormone IUD can make heavy menstrual periods lighter and decrease cramping.   The hormone IUD can be used for 3 or 5 years.   The copper IUD can be used for 10 years. DISADVANTAGES OF AN INTRAUTERINE DEVICE  The hormone IUD can be associated with irregular bleeding patterns.   The copper IUD can make your menstrual flow heavier and more painful.   You may experience cramping and vaginal bleeding after insertion.  Document Released: 07/24/2004 Document Revised: 04/22/2013 Document Reviewed: 02/08/2013 [facility] Patient Information 2014 [place].   Emergency Department Resource Guide 1) Find a Doctor and Pay Out of Pocket Although you won't have to find out who is covered by your insurance plan, it is a good idea to ask around and get recommendations. You will then need to call the office and see if the doctor you have chosen will accept you as a new patient and what types  of options they offer for patients who are self-pay. Some doctors offer discounts or will set up payment plans for their patients who do not have insurance, but you will need to ask so you aren't surprised when you get to your appointment.  2) Contact Your Local Health Department Not all health departments have doctors that can see patients for sick visits, but many do, so it is  worth a call to see if yours does. If you don't know where your local health department is, you can check in your phone book. The CDC also has a tool to help you locate your state's health department, and many state websites also have listings of all of their local health departments.  3) Find a [facility] If your illness is not likely to be very severe or complicated, you may want to try a walk in clinic. These are popping up all over the country in pharmacies, drugstores, and shopping centers. They're usually staffed by nurse practitioners or physician assistants that have been trained to treat common illnesses and complaints. They're usually fairly quick and inexpensive. However, if you have serious medical issues or chronic medical problems, these are probably not your best option.  No Primary Care Doctor: - Call Health Connect at  [phone] - they can help you locate a primary care doctor that  accepts your insurance, provides certain services, etc. - Physician Referral Service- [phone]  Chronic Pain Problems: Organization         Address  Phone   Notes  [facility]  [phone] Patients need to be referred by their primary care doctor.   Medication Assistance: Organization         Address  Phone   Notes  [facility] Medication [facility] [address] [place], [place] [address] [phone] --Must be a resident of [facility] -- Must have NO insurance coverage whatsoever (no Medicaid/ Medicare, etc.) -- The pt. MUST have a primary care doctor that directs their care regularly and follows them in the community   [facility]  [phone]   [employer]  [phone]    Agencies that provide inexpensive medical care: Organization         Address  Phone   Notes  [place]  [phone]   [facility]    [phone]   [facility],  [place] [address] [phone]   [place] [address] [place] [phone]   Planned Parenthood    [phone]   [facility]    [phone]   [place] and [address], [place] Phone:  [phone], Fax:  [phone] Hours of Operation:  9 am - 6 pm, M-F.  Also accepts Medicaid/Medicare and self-pay.  [facility] for [place], [address], [place] Phone: [phone], Fax: [phone]. Hours of Operation:  8:30 am - 5:30 pm, M-F.  Also accepts Medicaid and self-pay.  [facility] [place] [address] [place] Phone: [phone]   [place], [place], [place] [phone], Ext. [phone] Mondays & Thursdays: 7-9 AM.  First 15 patients are seen on a first come, first serve basis.    [place] Providers:  Organization         Address  Phone   Notes  [facility] [address], [place] [phone] Also accepts self-pay patients.  [facility] [address], [place]  [phone]   [place], [address], [place] [phone]   [facility] [address] [place] [phone]   Lucianne Lei [address], [place]   [phone] Only accepts [place] patients after they have their name applied to their card.   Self-Pay (no insurance) in [facility]:  Organization         Address  Phone   Notes  Sickle Cell Patients, [facility] [place] [phone]   [facility] [place] [phone]   [facility] [place]  [place], [place], [place] [phone]   [facility]/Dr. Osei-Bonsu  [address], [place] or [address]  [address], [place] [phone] Phone number for both [place] and [place] locations is the same.  [facility]gent Medical and [facility] [address], [place] [phone]   [facility] [address], [place] or [address] [phone]   [facility] [address] [place] [phone], phone; [phone], fax Sees patients 1st and 3rd Saturday of every month.  Must not qualify for public or private insurance (i.e. Medicaid, Medicare, [place] Health Choice, Veterans' Benefits)  Household income should be no more than 200% of the poverty level The clinic cannot treat you if you are pregnant or think you are pregnant  Sexually transmitted diseases are not treated at the clinic.    Dental Care: Organization         Address  Phone  Notes  [facility] Department of [facility] [place] [phone] Accepts children up to age 68 who are enrolled in [place] or [place]; pregnant women with a Medicaid card; and children who have applied for Medicaid or [place] Health Choice, but were declined, whose parents can pay a reduced fee at time of service.  [facility] Department of [facility]  [address], [place] [phone] Accepts children up to age 28 who are enrolled in [place] or [place]; pregnant women with a Medicaid card; and children who have applied for Medicaid or [place] Health Choice, but were declined, whose parents can pay a reduced fee at time of service.  [place] [facility]  [place] [phone] Patients are seen by appointment only. Walk-ins are not accepted. [place] will see patients 46 years of age and older. Monday - Tuesday (8am-5pm) Most Wednesdays (8:30-5pm) $30 per visit, cash only  [facility] Adult Dental Access PROGRAM  [address], [facility] [phone]  Patients are seen by appointment only. Walk-ins are not accepted. [place] will see patients 35 years of age and older. One Wednesday Evening (Monthly: Volunteer Based).  $30 per visit, cash only  [place]  [phone] for adults; Children under age 13, call Graduate Pediatric Dentistry at [phone]. Children aged 12-14, please call [phone] to request a pediatric application.  Dental services are provided in all areas of dental care including fillings, crowns and bridges, complete and partial  dentures, implants, gum treatment, root canals, and extractions. Preventive care is also provided. Treatment is provided to both adults and children. °Patients are selected via a lottery and there is often a waiting list. °  °[facility] [address], °[place] ° ([phone] [email or web address] °  °[facility] [address], [place], [place] [phone], Ext. [phone] Second and Fourth Thursday of each month, opens at 6:30 AM; Clinic ends at 9 AM.  Patients are seen on a first-come first-served basis, and a limited number are seen during each clinic.  ° °[facility] ° [address], [place], [place] [phone]   Eligibility Requirements °You must have lived in [place], [place], or [place] counties for at least the last three months. °  You cannot be eligible for state or federal sponsored healthcare insurance, including Veterans Administration, Medicaid, or Medicare. °  You generally cannot be eligible for healthcare insurance through your employer.  °  How to apply: °Eligibility screenings are held every Tuesday and Wednesday afternoon from 1:00 pm until 4:00 pm. You do not need an appointment for the interview!  °[facility] [address], [place], [place] [phone]   °[facility]  [phone]   °[facility]  [phone]   °[facility]   336-570-6415   ° °Behavioral Health Resources in the Community: °Intensive Outpatient Programs °Organization         Address  Phone  Notes  °[facility] [address], [place], [place] [phone]   °[facility] [address], [place], [place] [phone]   °[facility] [address], [place], [place] ° [phone]   °[facility] [address],  °[place], [place] 1-800-853-5163 or 336-641-4981   °Substance Abuse Resources °Organization         Address  Phone  Notes  °[facility]  336-882-2125   °[facility]  [phone]   °[facility]  [phone]   °[facility]  336-845-3988   °Residential & Outpatient Substance Abuse Program  1-800-659-3381   °Psychological Services °Organization         Address  Phone  Notes  °[facility]  336- [phone]   °[facility]  336- [phone]   °[facility] [address], [place] 1-800-853-5163 or 336-641-4981   ° °Mobile Crisis Teams °Organization         Address  Phone  Notes  °[facility], Mobile Crisis Care Unit  1-877-626-1772   °Assertive °[facility] Services ° [address] [place], [place] 336-834-9664   °Sharon DeEsch [address] °[place] 336-554-5454   ° °Self-Help/Support Groups °Organization         Address  Phone             Notes  °[facility] [place] - variety of support groups  336- 373-1402 Call for more information  °Narcotics Anonymous (NA), [facility] [address], °[place]  2 meetings at this location  ° °Residential Treatment Programs °Organization         Address  Phone  Notes  °[facility] [address],    °[place]  1-866-801-8205   °[facility] ° [address], [place],  [phone]   °[facility] [address], [place] [phone] Admissions: 8am-3pm M-F   °  [facility] [address]    [place], [place], [place] [phone]   [facility] [address] [place], [place], [place]   [facility] [address],  [place], [place]   [facility] - Intensive Outpatient [address], [place], [place]   [facility].,  [place], [place] 1-(315)[phone] or [phone]   [facility] ([facility]) [address] [place], [place] Accepts Medicaid  [place] [address]  [place] 1-5060368560 Substance Abuse/Addiction Treatment   [facility] Organization         Address  Phone  Notes  [facility]  [phone]   Domenic Schwab, PhD [address] Arlis Porta [employer], [place]   [phone] or [phone]   Isabella [place] [place], [place] [phone]   [facility] [address], [place], [place] [phone] Insurance/Medicaid/sponsorship through [facility] and Families [address], [address] [place]                                    [place], [place] [phone] [place] [address], [place] [phone]    Dr. Adele Schilder  [phone]   [facility] Dept. 1) [address] [place] 2) [place] 3)  [place] [address], [place] [phone]  [phone]   [place] [phone] or [phone] (After Hours)

## 2013-12-12 NOTE — ED Notes (Signed)
Pt is aware of the urine sample needed, unable to use the bathroom at this time

## 2013-12-12 NOTE — ED Notes (Signed)
Pt is screaming and crying that her abd hurts, states "they pain got worse today, i have IBS i dont know if that is making me hurt." she reports nausea also

## 2013-12-12 NOTE — ED Notes (Signed)
Patient called out stating that she is in pain. [place], PA is aware.

## 2013-12-12 NOTE — ED Provider Notes (Signed)
CSN: [id number]     Arrival date & time 12/12/13  1700 History   First MD Initiated Contact with Patient 12/12/13 1704     Chief Complaint  Patient presents with  . Abdominal Pain     (Consider location/radiation/quality/duration/timing/severity/associated sxs/prior Treatment) The history is provided by the patient. No language interpreter was used.  Meredith Howard is a 33 year old female with past medical history of endometriosis, neuromuscular disorder, back pain, gastritis, interstitial cystitis, panic attacks, PIP, trichomonas, depression presenting to the ED with suprapubic discomfort that occurred this morning. Patient reports that the pain as a cramping sensation with intermittent burning localized to suprapubic region and lower abdomen without radiation. Stated that the pain is constant and intense. Reported feelings of nausea without episodes of emesis. Stated that she's been noticed vaginal bleeding-reported at first was dark red clots that now turned to regular blood. Reported that she had her Mirena placed approximately 6 weeks ago for endometriosis-reported that this is the first time she's ever had an IUD placed. Stated that she experienced melena this morning-stated that she does take an iron pill. Patient reports she has been taking tramadol for pain with minimal relief. Stated that she has been feeling dizzy. Patient was seen and assessed in [facility] on 12/08/2013 regarding left lower quadrant pain where ultrasound and CT abdomen and pelvis with contrast was performed with negative findings. Patient was given Toradol and discharged home. Denied vaginal discharge, fevers, chills, chest pain, shortness of breath, difficulty breathing, vomiting, diarrhea, hematochezia, syncope. PCP none  Past Medical History  Diagnosis Date  . Neuromuscular disorder     fibromyalagia  . Back pain   . Gastritis   . Interstitial cystitis   . Endometrial cyst of ovary   . Anxiety     pt states she  experiences panic attacks  . GERD (gastroesophageal reflux disease)   . Migraine   . Hernia, hiatal   . Fibroid   . Panic attack   . Panic attacks 01/09/2012  . Fibromyalgia   . Urinary tract infection   . Fibromyalgia   . PID (pelvic inflammatory disease)   . Trichomonas   . Depression     seeing a therapist  . Preterm labor     preterm contractions, delivered full term   Past Surgical History  Procedure Laterality Date  . Laparoscopy  06/18/2011    Procedure: LAPAROSCOPY DIAGNOSTIC;  Surgeon: Juliene Pina C. Hulan Fray, MD;  Location: [facility];  Service: Gynecology;  Laterality: Bilateral;  with bilateral chromopertubation with excision of endometrial biopsy  . Laparoscopic endometriosis fulguration  06/2011  . Cesarean section  04/06/2012    Procedure: CESAREAN SECTION;  Surgeon: Delice Lesch, MD;  Location: [facility];  Service: Gynecology;  Laterality: N/A;  Primary cesarean section of baby boy  at 52    Family History  Problem Relation Age of Onset  . Fibroids Mother   . Hypertension Mother   . Hypertension Father   . Diabetes Father   . Kidney disease Father     kidney stones  . Gout Father   . Cancer Father     prostate  . Anesthesia problems Neg Hx   . Other Neg Hx    History  Substance Use Topics  . Smoking status: Never Smoker   . Smokeless tobacco: Never Used  . Alcohol Use: No   OB History   Grav Para Term Preterm Abortions TAB SAB Ect Mult Living   1 1 1  0 0 0 0  0 0 1     Review of Systems  Constitutional: Negative for fever and chills.  Respiratory: Negative for chest tightness and shortness of breath.   Gastrointestinal: Positive for nausea, abdominal pain and blood in stool. Negative for vomiting, diarrhea, constipation and anal bleeding.  Genitourinary: Positive for dysuria, vaginal bleeding, vaginal pain and pelvic pain. Negative for vaginal discharge.  Musculoskeletal: Negative for back pain and neck pain.  Neurological: Positive for dizziness. Negative for  weakness.  All other systems reviewed and are negative.     Allergies  Benadryl; Percocet; and Sertraline  Home Medications   Current Outpatient Rx  Name  Route  Sig  Dispense  Refill  . amitriptyline (ELAVIL) 50 MG tablet   Oral   Take 50 mg by mouth at bedtime.         . cyclobenzaprine (FLEXERIL) 10 MG tablet   Oral   Take 1 tablet (10 mg total) by mouth 2 (two) times daily as needed for muscle spasms.   20 tablet   0   . ferrous fumarate (HEMOCYTE - 106 MG FE) 325 (106 FE) MG TABS tablet   Oral   Take 1 tablet by mouth daily.         . fluticasone (FLONASE) 50 MCG/ACT nasal spray   Each Nare   Place 1 spray into both nostrils daily as needed for allergies or rhinitis.         . hyoscyamine (LEVSIN SL) 0.125 MG SL tablet   Sublingual   Place 0.125 mg under the tongue every 4 (four) hours as needed for cramping.         Marland Kitchen omeprazole (PRILOSEC) 20 MG capsule   Oral   Take 20 mg by mouth daily.         . simethicone (MYLICON) 0000000 MG chewable tablet   Oral   Chew 125 mg by mouth every 6 (six) hours as needed for flatulence.         . traMADol (ULTRAM) 50 MG tablet   Oral   Take 50 mg by mouth daily as needed for moderate pain.         Marland Kitchen levonorgestrel (MIRENA) 20 MCG/24HR IUD   Intrauterine   1 each by Intrauterine route once.          BP 114/75  Pulse 106  Temp(Src) 98.2 F (36.8 C) (Oral)  Resp 18  Ht 5\' 7"  (1.702 m)  Wt 146 lb (66.225 kg)  BMI 22.86 kg/m2  SpO2 100% Physical Exam  Nursing note and vitals reviewed. Constitutional: She is oriented to person, place, and time. She appears well-developed and well-nourished.  Patient found doubled over in pain laying on her bed rocking back and forth  HENT:  Head: Normocephalic and atraumatic.  Mouth/Throat: Oropharynx is clear and moist. No oropharyngeal exudate.  Eyes: Conjunctivae and EOM are normal. Pupils are equal, round, and reactive to light. Right eye exhibits no discharge. Left  eye exhibits no discharge.  Neck: Normal range of motion. Neck supple. No tracheal deviation present.  Cardiovascular: Normal rate, regular rhythm and normal heart sounds.  Exam reveals no friction rub.   No murmur heard. Cap refill < 3 seconds  Pulmonary/Chest: Effort normal and breath sounds normal. No respiratory distress. She has no wheezes. She has no rales.  Abdominal: Soft. There is tenderness. There is guarding.  Negative abdominal distention Exquisite tenderness upon palpation to the suprapubic region  Negative Murphy's sign Negative McBurney's point    Genitourinary:  Guaiac negative stool.  Rectal exam: Negative swelling, erythema, inflammation, lesions, sores noted. External hemorrhoids identify what negative thrombosis or active bleeding. Strong sphincter tone. Negative masses palpated. Negative fissures. Negative blood on glove. Brown stools noted to glove. Exam chaperoned with tech  Pelvic exam: negative swelling, erythema, inflammation, lesions, sores noted to the external genitalia. Negative active bleeding noted. Negative blood in the vaginal vault. Thick white blood tinged discharge noted. Bimanual performed by Dr. Darl Householder - negative CMT and bilateral adnexal tenderness. IUD removed by Dr. Darl Householder.   Musculoskeletal: Normal range of motion.  Full ROM to upper and lower extremities without difficulty noted, negative ataxia noted.  Lymphadenopathy:    She has no cervical adenopathy.  Neurological: She is alert and oriented to person, place, and time. No cranial nerve deficit. She exhibits normal muscle tone. Coordination normal.  Skin: Skin is warm and dry. No rash noted. She is not diaphoretic. No erythema.  Psychiatric: She has a normal mood and affect. Her behavior is normal. Thought content normal.    ED Course  Procedures (including critical care time)  Discussed case in great detail with attending physician. As per attending physician, recommended IUD to be removed.   7:45  PM IUD removed by Dr. Darl Householder - patient tolerated procedure well.   Results for orders placed during the hospital encounter of 12/12/13  WET PREP, GENITAL      Result Value Ref Range   Yeast Wet Prep HPF POC NONE SEEN  NONE SEEN   Trich, Wet Prep NONE SEEN  NONE SEEN   Clue Cells Wet Prep HPF POC NONE SEEN  NONE SEEN   WBC, Wet Prep HPF POC FEW (*) NONE SEEN  COMPREHENSIVE METABOLIC PANEL      Result Value Ref Range   Sodium 137  137 - 147 mEq/L   Potassium 3.8  3.7 - 5.3 mEq/L   Chloride 97  96 - 112 mEq/L   CO2 24  19 - 32 mEq/L   Glucose, Bld 92  70 - 99 mg/dL   BUN 8  6 - 23 mg/dL   Creatinine, Ser 0.70  0.50 - 1.10 mg/dL   Calcium 9.5  8.4 - 10.5 mg/dL   Total Protein 8.0  6.0 - 8.3 g/dL   Albumin 4.0  3.5 - 5.2 g/dL   AST 17  0 - 37 U/L   ALT 10  0 - 35 U/L   Alkaline Phosphatase 56  39 - 117 U/L   Total Bilirubin <0.2 (*) 0.3 - 1.2 mg/dL   GFR calc non Af Amer >90  >90 mL/min   GFR calc Af Amer >90  >90 mL/min  CBC WITH DIFFERENTIAL      Result Value Ref Range   WBC 4.1  4.0 - 10.5 K/uL   RBC 4.15  3.87 - 5.11 MIL/uL   Hemoglobin 9.4 (*) 12.0 - 15.0 g/dL   HCT 30.2 (*) 36.0 - 46.0 %   MCV 72.8 (*) 78.0 - 100.0 fL   MCH 22.7 (*) 26.0 - 34.0 pg   MCHC 31.1  30.0 - 36.0 g/dL   RDW 18.1 (*) 11.5 - 15.5 %   Platelets 362  150 - 400 K/uL   Neutrophils Relative % 53  43 - 77 %   Neutro Abs 2.2  1.7 - 7.7 K/uL   Lymphocytes Relative 38  12 - 46 %   Lymphs Abs 1.5  0.7 - 4.0 K/uL   Monocytes Relative 8  3 - 12 %  Monocytes Absolute 0.3  0.1 - 1.0 K/uL   Eosinophils Relative 1  0 - 5 %   Eosinophils Absolute 0.0  0.0 - 0.7 K/uL   Basophils Relative 0  0 - 1 %   Basophils Absolute 0.0  0.0 - 0.1 K/uL  URINALYSIS, ROUTINE W REFLEX MICROSCOPIC      Result Value Ref Range   Color, Urine YELLOW  YELLOW   APPearance CLEAR  CLEAR   Specific Gravity, Urine 1.025  1.005 - 1.030   pH 5.5  5.0 - 8.0   Glucose, UA NEGATIVE  NEGATIVE mg/dL   Hgb urine dipstick NEGATIVE  NEGATIVE    Bilirubin Urine SMALL (*) NEGATIVE   Ketones, ur NEGATIVE  NEGATIVE mg/dL   Protein, ur NEGATIVE  NEGATIVE mg/dL   Urobilinogen, UA 0.2  0.0 - 1.0 mg/dL   Nitrite NEGATIVE  NEGATIVE   Leukocytes, UA NEGATIVE  NEGATIVE  LIPASE, BLOOD      Result Value Ref Range   Lipase 34  11 - 59 U/L  PREGNANCY, URINE      Result Value Ref Range   Preg Test, Ur NEGATIVE  NEGATIVE  POC OCCULT BLOOD, ED      Result Value Ref Range   Fecal Occult Bld NEGATIVE  NEGATIVE    Labs Review Labs Reviewed  WET PREP, GENITAL - Abnormal; Notable for the following:    WBC, Wet Prep HPF POC FEW (*)    All other components within normal limits  COMPREHENSIVE METABOLIC PANEL - Abnormal; Notable for the following:    Total Bilirubin <0.2 (*)    All other components within normal limits  CBC WITH DIFFERENTIAL - Abnormal; Notable for the following:    Hemoglobin 9.4 (*)    HCT 30.2 (*)    MCV 72.8 (*)    MCH 22.7 (*)    RDW 18.1 (*)    All other components within normal limits  URINALYSIS, ROUTINE W REFLEX MICROSCOPIC - Abnormal; Notable for the following:    Bilirubin Urine SMALL (*)    All other components within normal limits  GC/CHLAMYDIA PROBE AMP  LIPASE, BLOOD  PREGNANCY, URINE  OCCULT BLOOD X 1 CARD TO LAB, STOOL  POC URINE PREG, ED  POC OCCULT BLOOD, ED   Imaging Review US Transvaginal Non-ob  12/12/2013   CLINICAL DATA:  Pelvic pain.  Evaluate for torsion.  EXAM: TRANSABDOMINAL AND TRANSVAGINAL ULTRASOUND OF PELVIS  DOPPLER ULTRASOUND OF OVARIES  TECHNIQUE: Both transabdominal and transvaginal ultrasound examinations of the pelvis were performed. Transabdominal technique was performed for global imaging of the pelvis including uterus, ovaries, adnexal regions, and pelvic cul-de-sac.  It was necessary to proceed with endovaginal exam following the transabdominal exam to visualize the ovaries. Color and duplex Doppler ultrasound was utilized to evaluate blood flow to the ovaries.  COMPARISON:   12/08/2013  FINDINGS: Uterus  Measurements: 8.3 x 4.5 x 5.9 cm per. Small fibroid within the posterior uterine fundus measuring 8 x 8 x 7 mm  Endometrium  Thickness: 4 mm.  IUD is present.  Right ovary  Measurements: 2.3 x 1.4 x 1.9 cm. Normal appearance/no adnexal mass.  Left ovary  Measurements: 4.7 x 1.9 x 2.3 cm. Normal appearance/no adnexal mass.  Pulsed Doppler evaluation of both ovaries demonstrates normal low-resistance arterial and venous waveforms.  Other findings  A small amount of fluid is identified within the cervical canal. There is also a small amount of free fluid noted within the pelvis.  IMPRESSION: 1. No  evidence for ovarian torsion. 2. Small posterior fibroid. 3. Small amount of free fluid noted within the pelvis. This may be physiologic in a premenopausal female.   Electronically Signed   By: Kerby Moors M.D.   On: 12/12/2013 19:11   US Pelvis Complete  12/12/2013   CLINICAL DATA:  Pelvic pain.  Evaluate for torsion.  EXAM: TRANSABDOMINAL AND TRANSVAGINAL ULTRASOUND OF PELVIS  DOPPLER ULTRASOUND OF OVARIES  TECHNIQUE: Both transabdominal and transvaginal ultrasound examinations of the pelvis were performed. Transabdominal technique was performed for global imaging of the pelvis including uterus, ovaries, adnexal regions, and pelvic cul-de-sac.  It was necessary to proceed with endovaginal exam following the transabdominal exam to visualize the ovaries. Color and duplex Doppler ultrasound was utilized to evaluate blood flow to the ovaries.  COMPARISON:  12/08/2013  FINDINGS: Uterus  Measurements: 8.3 x 4.5 x 5.9 cm per. Small fibroid within the posterior uterine fundus measuring 8 x 8 x 7 mm  Endometrium  Thickness: 4 mm.  IUD is present.  Right ovary  Measurements: 2.3 x 1.4 x 1.9 cm. Normal appearance/no adnexal mass.  Left ovary  Measurements: 4.7 x 1.9 x 2.3 cm. Normal appearance/no adnexal mass.  Pulsed Doppler evaluation of both ovaries demonstrates normal low-resistance arterial and  venous waveforms.  Other findings  A small amount of fluid is identified within the cervical canal. There is also a small amount of free fluid noted within the pelvis.  IMPRESSION: 1. No evidence for ovarian torsion. 2. Small posterior fibroid. 3. Small amount of free fluid noted within the pelvis. This may be physiologic in a premenopausal female.   Electronically Signed   By: Kerby Moors M.D.   On: 12/12/2013 19:11   [place] Art/ven Flow Abd Pelv Doppler  12/12/2013   CLINICAL DATA:  Pelvic pain.  Evaluate for torsion.  EXAM: TRANSABDOMINAL AND TRANSVAGINAL ULTRASOUND OF PELVIS  DOPPLER ULTRASOUND OF OVARIES  TECHNIQUE: Both transabdominal and transvaginal ultrasound examinations of the pelvis were performed. Transabdominal technique was performed for global imaging of the pelvis including uterus, ovaries, adnexal regions, and pelvic cul-de-sac.  It was necessary to proceed with endovaginal exam following the transabdominal exam to visualize the ovaries. Color and duplex Doppler ultrasound was utilized to evaluate blood flow to the ovaries.  COMPARISON:  12/08/2013  FINDINGS: Uterus  Measurements: 8.3 x 4.5 x 5.9 cm per. Small fibroid within the posterior uterine fundus measuring 8 x 8 x 7 mm  Endometrium  Thickness: 4 mm.  IUD is present.  Right ovary  Measurements: 2.3 x 1.4 x 1.9 cm. Normal appearance/no adnexal mass.  Left ovary  Measurements: 4.7 x 1.9 x 2.3 cm. Normal appearance/no adnexal mass.  Pulsed Doppler evaluation of both ovaries demonstrates normal low-resistance arterial and venous waveforms.  Other findings  A small amount of fluid is identified within the cervical canal. There is also a small amount of free fluid noted within the pelvis.  IMPRESSION: 1. No evidence for ovarian torsion. 2. Small posterior fibroid. 3. Small amount of free fluid noted within the pelvis. This may be physiologic in a premenopausal female.   Electronically Signed   By: Kerby Moors M.D.   On: 12/12/2013 19:11      EKG Interpretation None      MDM   Final diagnoses:  Pelvic pain  IUD complication   Medications  sodium chloride 0.9 % bolus 1,000 mL (0 mLs Intravenous Stopped 12/12/13 1918)  morphine 4 MG/ML injection 4 mg (4 mg Intravenous Given  12/12/13 1749)  ondansetron (ZOFRAN) injection 4 mg (4 mg Intravenous Given 12/12/13 1749)  ketorolac (TORADOL) 15 MG/ML injection 15 mg (15 mg Intravenous Given 12/12/13 1936)  sodium chloride 0.9 % bolus 1,000 mL (1,000 mLs Intravenous New Bag/Given 12/12/13 1929)  cefTRIAXone (ROCEPHIN) injection 250 mg (250 mg Intramuscular Given 12/12/13 2132)  azithromycin (ZITHROMAX) tablet 1,000 mg (1,000 mg Oral Given 12/12/13 2124)  ketorolac (TORADOL) 15 MG/ML injection 15 mg (15 mg Intravenous Given 12/12/13 2136)  lidocaine (PF) (XYLOCAINE) 1 % injection (1 mL  Given 12/12/13 2132)   Filed Vitals:   12/12/13 2000 12/12/13 2037 12/12/13 2100 12/12/13 2157  BP: 126/80 130/71 117/74 114/75  Pulse: 105 116 105 106  Temp:      TempSrc:      Resp:  18  18  Height:      Weight:      SpO2: 100% 100% 100% 100%    Patient presenting to the ED with abdominal pain localized to suprapubic region described as a cramping sensation burning without radiation that started this morning. Associated symptoms of one episode of melenic stools, nausea. Patient reports she's been using Tylenol with minimal relief. This provider reviewed patient's chart. Patient recently had an IUD placed-Mirena approximately 6 weeks ago by Dr. Hulan Fray at [facility] - patient reported this is the first time she's ever had an IUD placed. Patient was seen and assessed at [facility] on 12/08/2013 regarding left lower quadrant pain. CT abdomen and pelvis with contrast was performed with negative findings. Ultrasound performed identified by intrauterine device with proper placement-negative findings for perforation. Patient was discharged home. Patient has an upper GI and colonoscopy scheduled in 2 weeks. Patient found laying  in bed uncomfortable, rocking back and forth and hunched over in pain. GCS 15. Heart rate and rhythm normal. Lungs clear to auscultation to upper and lower extremities bilaterally. Radial and DP pulses 2+ bilaterally. Patient unable to sit still during exam. Discomfort upon palpation to suprapubic region. Negative Murphy sign. Negative McBurney's point. Rectal exam unremarkable-external hemorrhoids noted. CBC negative elevated white blood cell count identified. Hemoglobin 9.4 - when compared to previous labs patient has history of anemia with hemoglobin running as low as 8.8. CMP negative findings-kidney and liver function well. Lipase negative elevation. Fecal occult negative. Urine negative for nitrites-negative leukocytes. Urine pregnancy negative. Ultrasounds negative for ovarian portion. Small posterior fibroid identified. Small amount of free fluid noted within the pelvis-this may be physiological in a premenopausal female. IUD is present. Wet negative for trichomonas, few clue cells identified. Patient seen and assessed by attending physician. Dr. Darl Householder removed IUD from patient - patient tolerated procedure well. Suspicion of discomfort to be from IUD, since this is the patient's first IUD - patient also have alleged history of endometriosis, can be from endometriosis. As per attending physician recommended patient to be given ceftriaxone and azithromycin. Doubt TOA. Doubt ovarian torsion. Doubt acute abdominal processes - CT abdomen pelvis with contrast from 12/08/2013 was negative. Doubt uterus perforation. Suspicion of discomfort to be due to IUD, as well as endometriosis pain cannot be ruled out. IUD removed while in ED setting - as per request from physician. Once IUD removed patient's pain improved. Patient's pain controlled, patient appeared well after procedure performed. Patient covered for STD while in ED setting secondary to discharge identified. Pain medications administered in ED setting. Pain  controlled. Patient stable, afebrile. Discharged patient. Referred patient to OB/GYN and primary care provider. Discharge patient with naproxen. Discussed with patient to rest  and stay hydrated. Discussed with patient to closely monitor symptoms and if symptoms are to worsen or change to report back to the ED - strict return instructions given.  Patient agreed to plan of care, understood, all questions answered.   Jamse Mead, PA-C 12/13/13 1526

## 2013-12-13 ENCOUNTER — Telehealth (HOSPITAL_BASED_OUTPATIENT_CLINIC_OR_DEPARTMENT_OTHER): Payer: Self-pay | Admitting: *Deleted

## 2013-12-13 NOTE — Telephone Encounter (Signed)
Pt called to find out when she could take her pain med again.

## 2013-12-13 NOTE — ED Provider Notes (Signed)
12/13/2013 3:25 PM This provider spoke with the patient - verified by DOB - patient reported that she is feeling a lot better. Patient reported that her pain has decreased and that she is no longer bleeding - reported that the spotting has decreased. Discussed with patient caution regarding Naproxen and how this medication can lead to GI bleeds, since patient has history of gastritis. Discussed with patient to try and refrain from using the medication and to stick with Tramadol and heat compressions. Discussed with patient to rest and stay hydrated. Recommended patient to call her OBGYN first thing tomorrow morning. Discussed with patient to closely monitor symptoms and if symptoms are to worsen or change to report back to the ED - strict return instructions given.  Patient agreed to plan of care, understood, all questions answered.   Jamse Mead, PA-C 12/13/13 1531

## 2013-12-13 NOTE — ED Provider Notes (Signed)
Medical screening examination/treatment/procedure(s) were performed by non-physician practitioner and as supervising physician I was immediately available for consultation/collaboration.   EKG Interpretation None        [name], MD 12/13/13 2337

## 2013-12-14 LAB — GC/CHLAMYDIA PROBE AMP
CT Probe RNA: NEGATIVE
GC Probe RNA: NEGATIVE

## 2013-12-16 ENCOUNTER — Ambulatory Visit: Payer: No Typology Code available for payment source | Admitting: Obstetrics & Gynecology

## 2013-12-17 NOTE — ED Provider Notes (Signed)
Medical screening examination/treatment/procedure(s) were conducted as a shared visit with non-physician practitioner(s) and myself.  I personally evaluated the patient during the encounter.   EKG Interpretation None      Meredith Howard is a 33 y.o. female hx of endometriosis with crampy lower abdominal pain. Had IUD placed 6 weeks, had spotting and crampy pain since then. Wants IUD taken out. IUD confirmed by [place]. I performed pelvic exam with PA and took out the IUD. Patient felt better afterwards.   IUD extraction I used specutum to visualize the cervix. I also visualized the string attached to the IUD. I was able to pull out the IUD using a forceps. The IUD appears intact with no missing pieces. No complications    [name], MD 12/17/13 [phone]

## 2014-04-18 IMAGING — US US FETAL BPP W/O NONSTRESS
1 series · 7 of 7 positions shown · non-contrast
Comparison: 02/05/2012

CLINICAL DATA: Non-reactive nonstress test.

ULTRAOUND FETAL BPP W/O NONSTRESS
TECHNIQUE: Sonographic fetal biophysical profile was performed.

[Series 1: us fetal bpp w/o nonstress · non-contrast · 7 acquisitions, 7 frames shown]
[im 1/7]
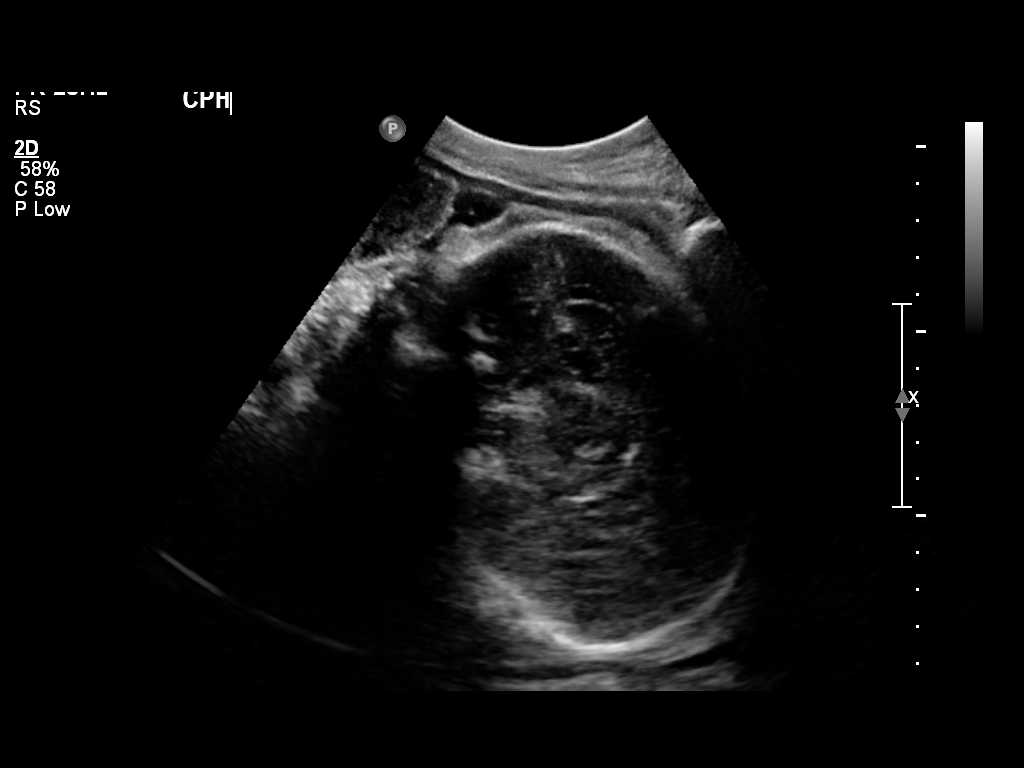
[im 2/7]
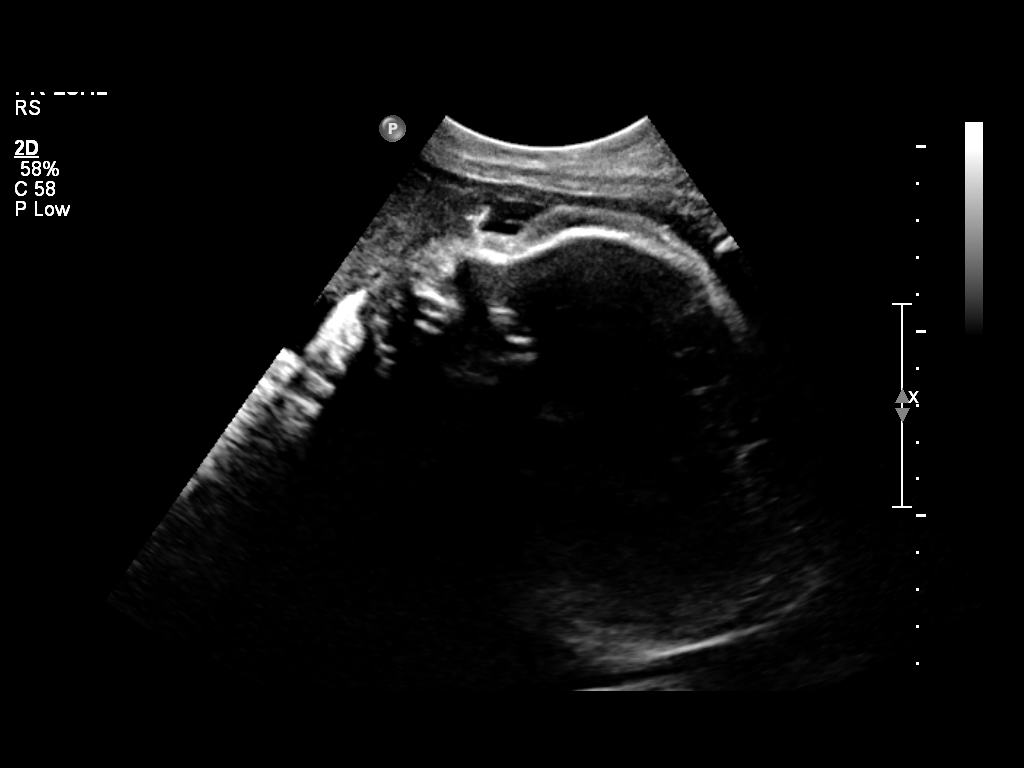
[im 3/7]
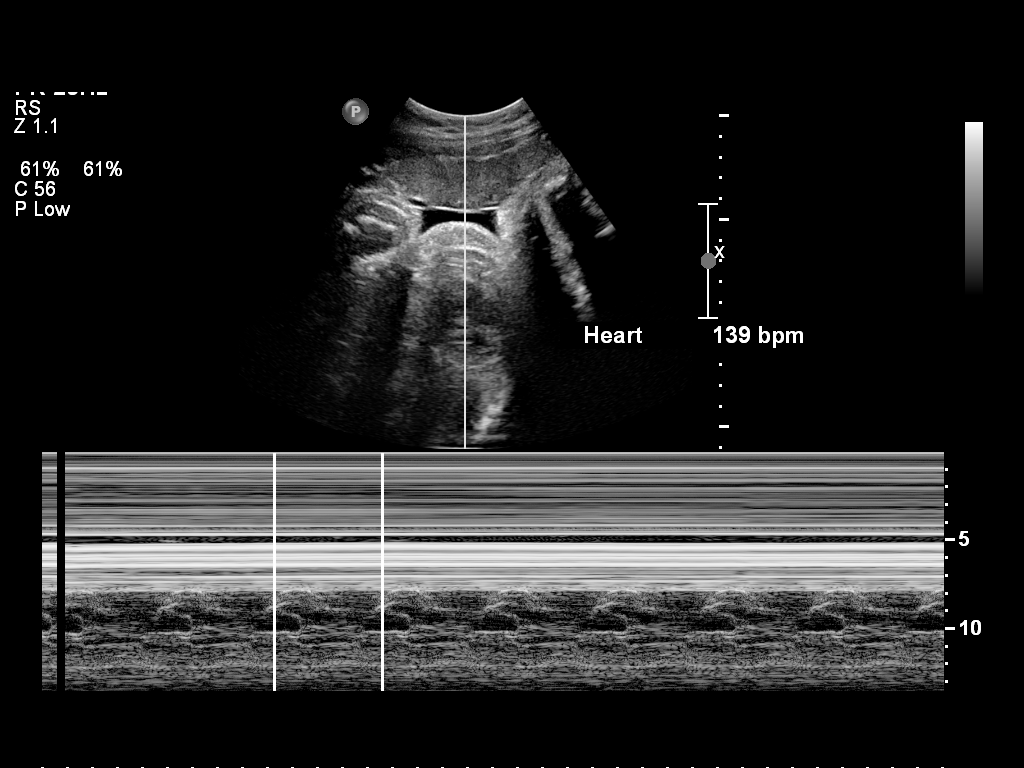
[im 4/7]
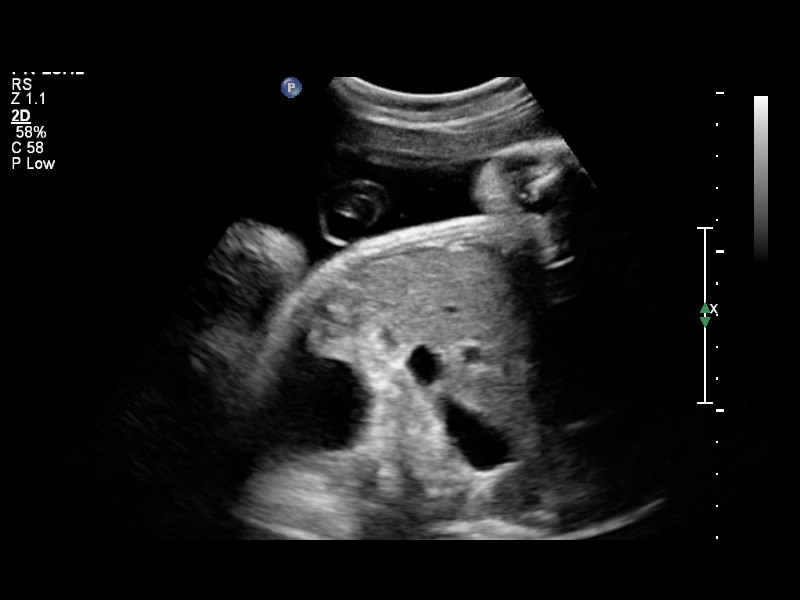
[im 5/7]
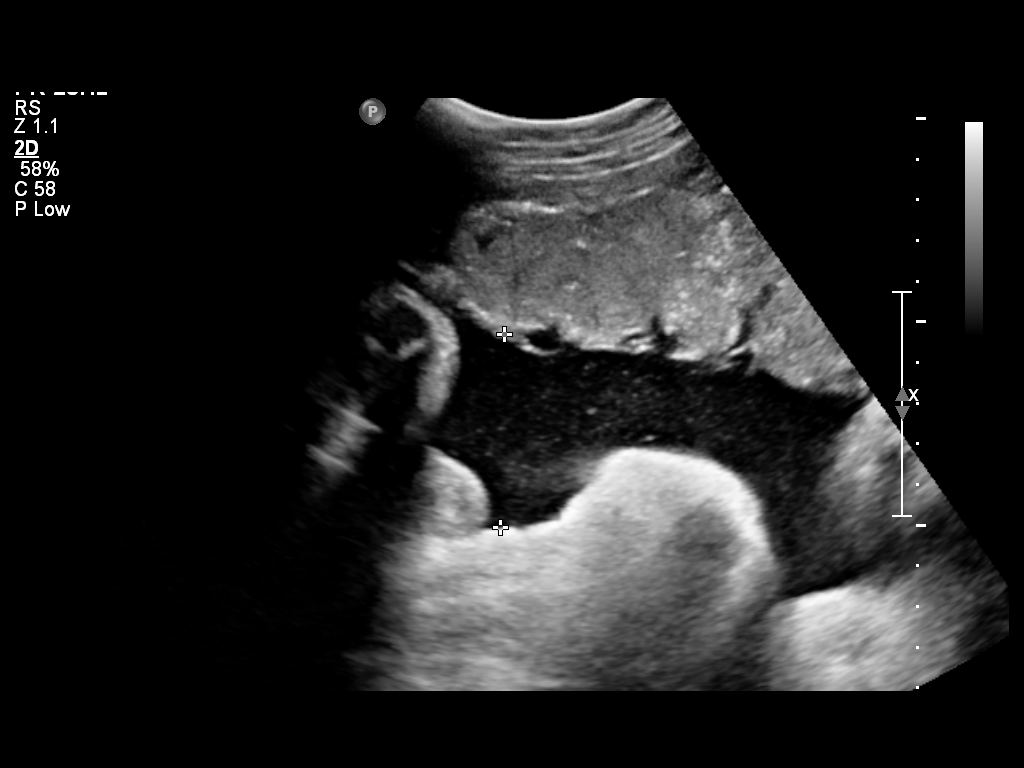
[im 6/7]
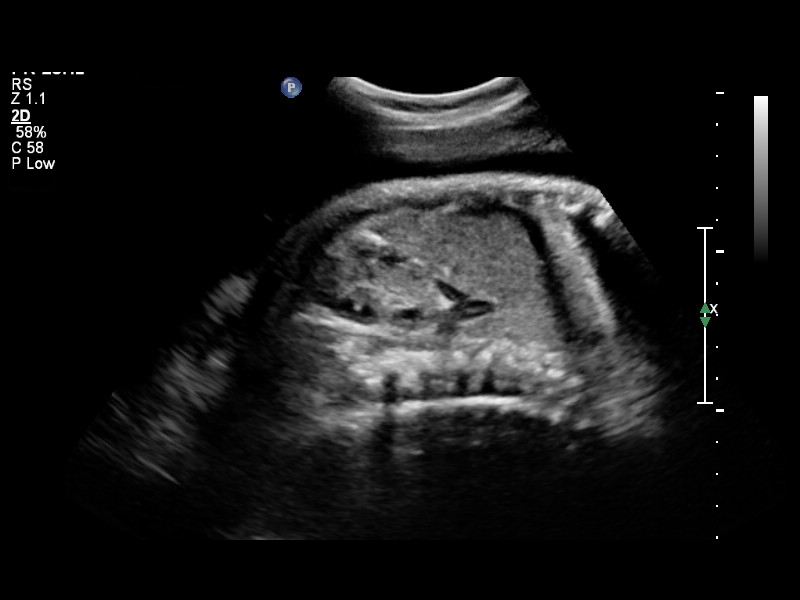
[im 7/7]
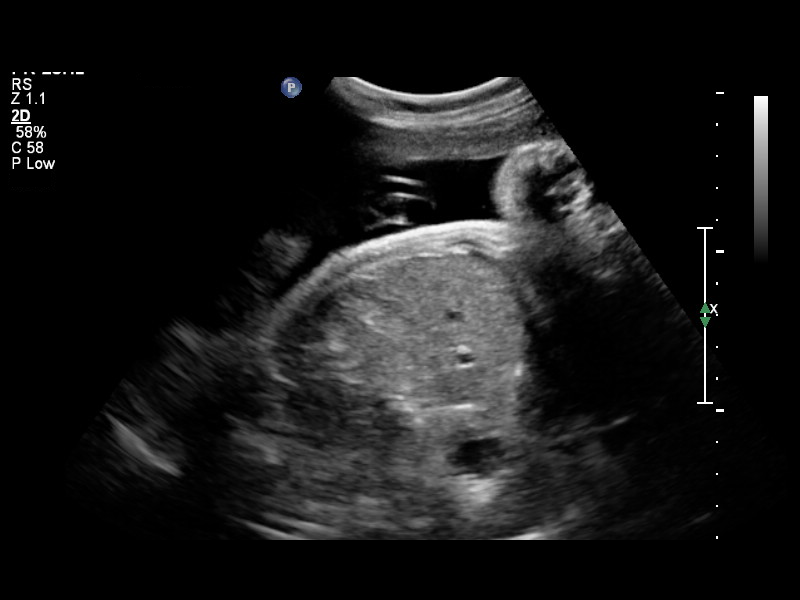

[7 of 7 positions shown; findings below may reference images not displayed]

FINDINGS: A single intrauterine pregnancy is noted with cardiac
activity at 139 beats per minute, and cephalic presentation.

No placenta previa is observed.

Amniotic fluid appears subjectively normal, with a 4.7 cm pocket
identified.

Fetal biophysical profile is as follows:

Movement:  2
Breathing:  2
Tone:  2
Amniotic fluid:  2
Total score:  [DATE]
IMPRESSION: 1.  Single living intrauterine pregnancy in cephalic presentation
with biophysical profile score of [DATE].

## 2014-04-22 ENCOUNTER — Encounter (HOSPITAL_COMMUNITY): Payer: Self-pay | Admitting: Emergency Medicine

## 2014-04-22 ENCOUNTER — Emergency Department (HOSPITAL_COMMUNITY)
Admission: EM | Admit: 2014-04-22 | Discharge: 2014-04-23 | Disposition: A | Discharge status: Home / Self Care | Payer: No Typology Code available for payment source | Attending: Emergency Medicine | Admitting: Emergency Medicine

## 2014-04-22 ENCOUNTER — Emergency Department (HOSPITAL_COMMUNITY): Payer: No Typology Code available for payment source

## 2014-04-22 DIAGNOSIS — R110 Nausea: Secondary | ICD-10-CM | POA: Insufficient documentation

## 2014-04-22 DIAGNOSIS — Z3202 Encounter for pregnancy test, result negative: Secondary | ICD-10-CM | POA: Diagnosis not present

## 2014-04-22 DIAGNOSIS — Z79899 Other long term (current) drug therapy: Secondary | ICD-10-CM | POA: Insufficient documentation

## 2014-04-22 DIAGNOSIS — R5381 Other malaise: Secondary | ICD-10-CM | POA: Diagnosis not present

## 2014-04-22 DIAGNOSIS — Z8742 Personal history of other diseases of the female genital tract: Secondary | ICD-10-CM | POA: Diagnosis not present

## 2014-04-22 DIAGNOSIS — H9319 Tinnitus, unspecified ear: Secondary | ICD-10-CM | POA: Insufficient documentation

## 2014-04-22 DIAGNOSIS — M542 Cervicalgia: Secondary | ICD-10-CM | POA: Insufficient documentation

## 2014-04-22 DIAGNOSIS — F411 Generalized anxiety disorder: Secondary | ICD-10-CM | POA: Diagnosis not present

## 2014-04-22 DIAGNOSIS — F329 Major depressive disorder, single episode, unspecified: Secondary | ICD-10-CM | POA: Diagnosis not present

## 2014-04-22 DIAGNOSIS — F3289 Other specified depressive episodes: Secondary | ICD-10-CM | POA: Insufficient documentation

## 2014-04-22 DIAGNOSIS — Z87440 Personal history of urinary (tract) infections: Secondary | ICD-10-CM | POA: Diagnosis not present

## 2014-04-22 DIAGNOSIS — K219 Gastro-esophageal reflux disease without esophagitis: Secondary | ICD-10-CM | POA: Insufficient documentation

## 2014-04-22 DIAGNOSIS — G43109 Migraine with aura, not intractable, without status migrainosus: Secondary | ICD-10-CM | POA: Diagnosis not present

## 2014-04-22 DIAGNOSIS — R209 Unspecified disturbances of skin sensation: Secondary | ICD-10-CM | POA: Insufficient documentation

## 2014-04-22 DIAGNOSIS — R51 Headache: Secondary | ICD-10-CM | POA: Diagnosis present

## 2014-04-22 DIAGNOSIS — Z8619 Personal history of other infectious and parasitic diseases: Secondary | ICD-10-CM | POA: Diagnosis not present

## 2014-04-22 DIAGNOSIS — R5383 Other fatigue: Secondary | ICD-10-CM

## 2014-04-22 LAB — CBC WITH DIFFERENTIAL/PLATELET
BASOS ABS: 0 10*3/uL (ref 0.0–0.1)
Basophils Relative: 0 % (ref 0–1)
EOS ABS: 0 10*3/uL (ref 0.0–0.7)
EOS PCT: 0 % (ref 0–5)
HEMATOCRIT: 37.8 % (ref 36.0–46.0)
Hemoglobin: 12.9 g/dL (ref 12.0–15.0)
LYMPHS PCT: 35 % (ref 12–46)
Lymphs Abs: 1.8 10*3/uL (ref 0.7–4.0)
MCH: 29.9 pg (ref 26.0–34.0)
MCHC: 34.1 g/dL (ref 30.0–36.0)
MCV: 87.5 fL (ref 78.0–100.0)
MONO ABS: 0.3 10*3/uL (ref 0.1–1.0)
Monocytes Relative: 5 % (ref 3–12)
Neutro Abs: 3 10*3/uL (ref 1.7–7.7)
Neutrophils Relative %: 60 % (ref 43–77)
Platelets: 296 10*3/uL (ref 150–400)
RBC: 4.32 MIL/uL (ref 3.87–5.11)
RDW: 13.2 % (ref 11.5–15.5)
WBC: 5.1 10*3/uL (ref 4.0–10.5)

## 2014-04-22 LAB — POC URINE PREG, ED: Preg Test, Ur: NEGATIVE

## 2014-04-22 LAB — BASIC METABOLIC PANEL
ANION GAP: 15 (ref 5–15)
BUN: 9 mg/dL (ref 6–23)
CO2: 23 meq/L (ref 19–32)
CREATININE: 0.7 mg/dL (ref 0.50–1.10)
Calcium: 9.9 mg/dL (ref 8.4–10.5)
Chloride: 100 mEq/L (ref 96–112)
GFR calc Af Amer: >90 mL/min (ref >90)
GLUCOSE: 99 mg/dL (ref 70–99)
Potassium: 4.1 mEq/L (ref 3.7–5.3)
Sodium: 138 mEq/L (ref 137–147)

## 2014-04-22 MED ORDER — LORAZEPAM 2 MG/ML IJ SOLN
1.0000 mg | Freq: Once | INTRAMUSCULAR | Status: DC
  Filled 2014-04-22: qty 1

## 2014-04-22 MED ORDER — KETOROLAC TROMETHAMINE 30 MG/ML IJ SOLN
30.0000 mg | Freq: Once | INTRAMUSCULAR | Status: AC
  Administered 2014-04-22: 30 mg via INTRAVENOUS
  Filled 2014-04-22: qty 1

## 2014-04-22 MED ORDER — HYDROMORPHONE HCL PF 1 MG/ML IJ SOLN: 1.0000 mg | Freq: Once | INTRAMUSCULAR | Status: AC | PRN

## 2014-04-22 MED ORDER — DEXAMETHASONE SODIUM PHOSPHATE 10 MG/ML IJ SOLN
10.0000 mg | Freq: Once | INTRAMUSCULAR | Status: AC
  Administered 2014-04-22: 10 mg via INTRAVENOUS
  Filled 2014-04-22: qty 1

## 2014-04-22 MED ORDER — METOCLOPRAMIDE HCL 5 MG/ML IJ SOLN
10.0000 mg | INTRAMUSCULAR | Status: AC
  Administered 2014-04-22: 10 mg via INTRAVENOUS
  Filled 2014-04-22: qty 2

## 2014-04-22 NOTE — ED Notes (Signed)
Paged pt and called pt twice no answer

## 2014-04-22 NOTE — ED Notes (Signed)
Pt reports headache and numbness to right side of face, arm and leg. Reports same s/s over weekend, was admitted, determined to have complex migraine. Pt is a x 4. Right arm is weak, speech clear, no facial droop. When pt discharged, symptoms remained.

## 2014-04-22 NOTE — ED Notes (Signed)
Pt reports feeling anxious and irritable after medications. NS fluid bolus started

## 2014-04-22 NOTE — ED Provider Notes (Signed)
CSN: [id number]     Arrival date & time 04/22/14  1759 History   First MD Initiated Contact with Patient 04/22/14 2205     Chief Complaint  Patient presents with  . Headache  . Numbness     (Consider location/radiation/quality/duration/timing/severity/associated sxs/prior Treatment) HPI Comments: Patient is a 33 year old female with a history of fibromyalgia, migraine headaches, depression, and chronic back pain. She presents to the emergency department today for further evaluation of a headache. Patient states that headache began yesterday. Symptoms were gradual in onset. Patient states the pain is located in her right temporal region as well as her occipital region. Symptoms associated with neck pain yesterday without stiffness. Patient states that this morning she noticed a numbness to the right side of her face as well as numbness in her right upper extremity and weakness of her right leg. Symptoms also include blurry vision of R eye and R sided tinnitus. She has been taking Excedrin migraine for symptoms without only temporary relief of symptoms. Numbness and weakness improve with improving headache, per patient.   Patient states that she has had similar symptoms associated with migraines, for the first time on 04/16/14. Patient was admitted to [facility] for further evaluation of symptoms. She was discharged on 04/18/14 with dx of complex migraines. Patient states she had an unremarkable CT and MRI while in the hospital. She denies seeing a neurologist prior to d/c or being given neurology follow up.  Patient is a 33 y.o. female presenting with headaches. The history is provided by the patient. No language interpreter was used.  Headache Associated symptoms: nausea, neck pain, numbness and photophobia (R eye)   Associated symptoms: no fever, no hearing loss and no neck stiffness     Past Medical History  Diagnosis Date  . Neuromuscular disorder     fibromyalagia  . Back pain   . Gastritis   .  Interstitial cystitis   . Endometrial cyst of ovary   . Anxiety     pt states she experiences panic attacks  . GERD (gastroesophageal reflux disease)   . Migraine   . Hernia, hiatal   . Fibroid   . Panic attack   . Panic attacks 01/09/2012  . Fibromyalgia   . Urinary tract infection   . Fibromyalgia   . PID (pelvic inflammatory disease)   . Trichomonas   . Depression     seeing a therapist  . Preterm labor     preterm contractions, delivered full term   Past Surgical History  Procedure Laterality Date  . Laparoscopy  06/18/2011    Procedure: LAPAROSCOPY DIAGNOSTIC;  Surgeon: Juliene Pina C. Hulan Fray, MD;  Location: [facility];  Service: Gynecology;  Laterality: Bilateral;  with bilateral chromopertubation with excision of endometrial biopsy  . Laparoscopic endometriosis fulguration  06/2011  . Cesarean section  04/06/2012    Procedure: CESAREAN SECTION;  Surgeon: Delice Lesch, MD;  Location: [place] [facility];  Service: Gynecology;  Laterality: N/A;  Primary cesarean section of baby boy  at 33    Family History  Problem Relation Age of Onset  . Fibroids Mother   . Hypertension Mother   . Hypertension Father   . Diabetes Father   . Kidney disease Father     kidney stones  . Gout Father   . Cancer Father     prostate  . Anesthesia problems Neg Hx   . Other Neg Hx    History  Substance Use Topics  . Smoking status: Never Smoker   .  Smokeless tobacco: Never Used  . Alcohol Use: No   OB History   Grav Para Term Preterm Abortions TAB SAB Ect Mult Living   1 1 1  0 0 0 0 0 0 1      Review of Systems  Constitutional: Negative for fever.  HENT: Positive for tinnitus. Negative for hearing loss.   Eyes: Positive for photophobia (R eye) and visual disturbance.  Gastrointestinal: Positive for nausea.  Musculoskeletal: Positive for neck pain. Negative for neck stiffness.  Neurological: Positive for weakness, numbness and headaches. Negative for syncope and facial asymmetry.  All other systems  reviewed and are negative.    Allergies  Benadryl; Percocet; and Sertraline  Home Medications   Prior to Admission medications   Medication Sig Start Date End Date Taking? Authorizing Provider  amitriptyline (ELAVIL) 50 MG tablet Take 50 mg by mouth at bedtime.   Yes Historical Provider, MD  ferrous fumarate (HEMOCYTE - 106 MG FE) 325 (106 FE) MG TABS tablet Take 1 tablet by mouth daily.   Yes Historical Provider, MD  fluticasone (FLONASE) 50 MCG/ACT nasal spray Place 1 spray into both nostrils daily as needed for allergies or rhinitis.   Yes Historical Provider, MD  hyoscyamine (LEVSIN SL) 0.125 MG SL tablet Place 0.125 mg under the tongue every 4 (four) hours as needed for cramping.   Yes Historical Provider, MD  levonorgestrel (MIRENA) 20 MCG/24HR IUD 1 each by Intrauterine route once.   Yes Historical Provider, MD  omeprazole (PRILOSEC) 20 MG capsule Take 20 mg by mouth daily.   Yes Historical Provider, MD  simethicone (MYLICON) 466 MG chewable tablet Chew 125 mg by mouth every 6 (six) hours as needed for flatulence.   Yes Historical Provider, MD  naproxen (NAPROSYN) 500 MG tablet Take 1 tablet (500 mg total) by mouth 2 (two) times daily. 04/23/14   Antonietta Breach, PA-C  traMADol (ULTRAM) 50 MG tablet Take 50 mg by mouth daily as needed for moderate pain.    Historical Provider, MD   BP 117/73  Pulse 99  Temp(Src) 98.1 F (36.7 C) (Oral)  Resp 16  Wt 147 lb (66.679 kg)  SpO2 100%  Physical Exam  Nursing note and vitals reviewed. Constitutional: She is oriented to person, place, and time. She appears well-developed and well-nourished. No distress.  Nontoxic/nonseptic appearing  HENT:  Head: Normocephalic and atraumatic.  Eyes: Conjunctivae and EOM are normal. Pupils are equal, round, and reactive to light. No scleral icterus.  Pupils equal round and reactive to direct and consensual light. EOMs normal without nystagmus.  Neck: Normal range of motion.  No nuchal rigidity or  meningismus  Cardiovascular: Normal rate, regular rhythm and normal heart sounds.   Patient not tachycardic as noted in triage  Pulmonary/Chest: Effort normal and breath sounds normal. No respiratory distress. She has no wheezes. She has no rales.  Musculoskeletal: Normal range of motion.  Neurological: She is alert and oriented to person, place, and time. No cranial nerve deficit. She exhibits normal muscle tone. Coordination normal.  GCS 15. Patient speaks in full oriented sentences. No facial drooping noted. Eyebrow raise symmetric. Subjective numbness to R side of face, RUE and RLE. Grips 4/5 on the right; 5/5 on the left. Strength against resistance 4/5 on the right and 5/5 on the left in upper and lower extremities. No pronator drift. Finger to nose and finger to thumb opposition intact b/l. Patient moves extremities without ataxia.  Skin: Skin is warm and dry. No rash noted. She  is not diaphoretic. No erythema. No pallor.  Psychiatric: She has a normal mood and affect. Her behavior is normal.    ED Course  Procedures (including critical care time) Labs Review Labs Reviewed  CBC WITH DIFFERENTIAL  BASIC METABOLIC PANEL  POC URINE PREG, ED    Imaging Review Ct Head Wo Contrast  04/22/2014   CLINICAL DATA:  Headache and right-sided numbness.  EXAM: CT HEAD WITHOUT CONTRAST  TECHNIQUE: Contiguous axial images were obtained from the base of the skull through the vertex without contrast.  COMPARISON:  04/16/2014  FINDINGS: Normal appearance of the intracranial structures. No evidence for acute hemorrhage, mass lesion, midline shift, hydrocephalus or large infarct. No acute bony abnormality. There is a polyp or retention cyst in the right maxillary sinus. There is mild mucosal disease in the right ethmoid air cells.  IMPRESSION: No acute intracranial abnormality.   Electronically Signed   By: Markus Daft M.D.   On: 04/22/2014 21:46     EKG Interpretation None      MDM   Final  diagnoses:  Complicated migraine    33 year old female presents to the emergency department today for further evaluation of headache. Patient endorsing a right temporal headache associated with right-sided facial numbness as well as subjective numbness in right upper extremity. The patient also stating that her right leg feels weak. Patient has no cranial nerve deficit today. She does have some weakness appreciated on the right side. No fever, nuchal rigidity, or meningismus.  Patient was recently seen and evaluated 1 week ago at [facility] for these same symptoms. Patient was admitted at which time she had a negative CT scan and MRI. Patient's workup today has been unremarkable. Symptoms treated with Toradol, Reglan, Decadron, and morphine. Patient with improvement in pain from 8/10 to 0/10. Symptoms today are atypical for stroke or TIA. Symptoms, instead, consistent with complicated migraine. Patient is stable for discharge with instruction to followup with neurology for further evaluation of her symptoms. Naproxen advised for pain control. Return precautions provided and patient agreeable to plan with no unaddressed concerns.   Filed Vitals:   04/22/14 1822 04/22/14 2256 04/23/14 0010 04/23/14 0043  BP: 125/76 116/80 127/75 117/73  Pulse: 100 100 106 99  Temp: 98.1 F (36.7 C)     TempSrc: Oral     Resp: 15 16 16 16   Weight: 147 lb (66.679 kg)     SpO2: 100% 100% 100% 100%       Antonietta Breach, PA-C 04/23/14 0645

## 2014-04-22 NOTE — ED Notes (Addendum)
Pt reports wanting to "just go home." Refused Ativan; reports headache pain is better; reports leg pain. Claiborne Billings, [place] aware

## 2014-04-23 IMAGING — US US FETAL BPP W/O NONSTRESS
1 series · 13 of 24 positions shown · non-contrast
Comparison: none

[Series 1: us fetal bpp w/o nonstress · non-contrast · 24 acquisitions, 13 frames shown]
[im 1/24]
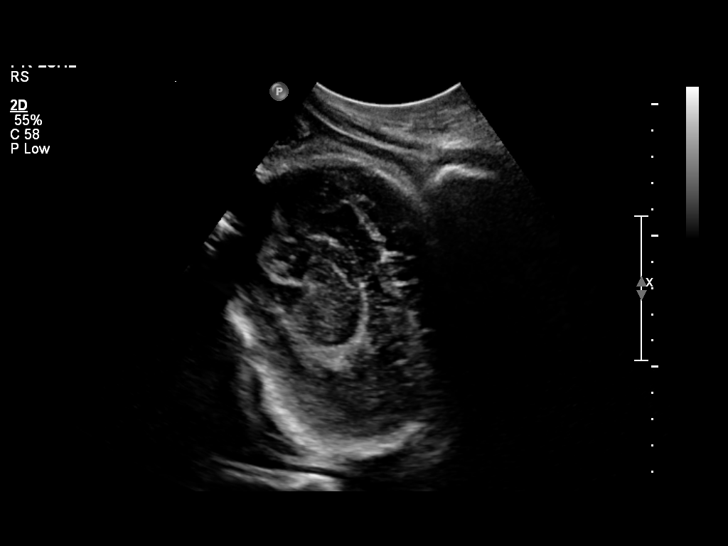
[im 3/24]
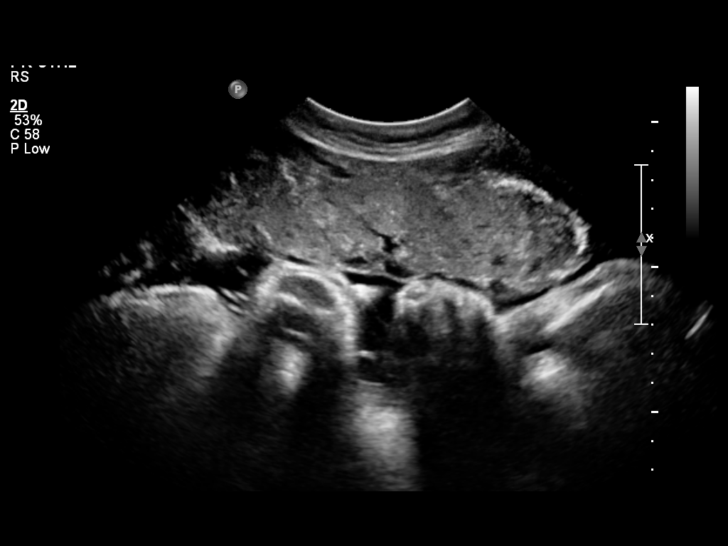
[im 5/24]
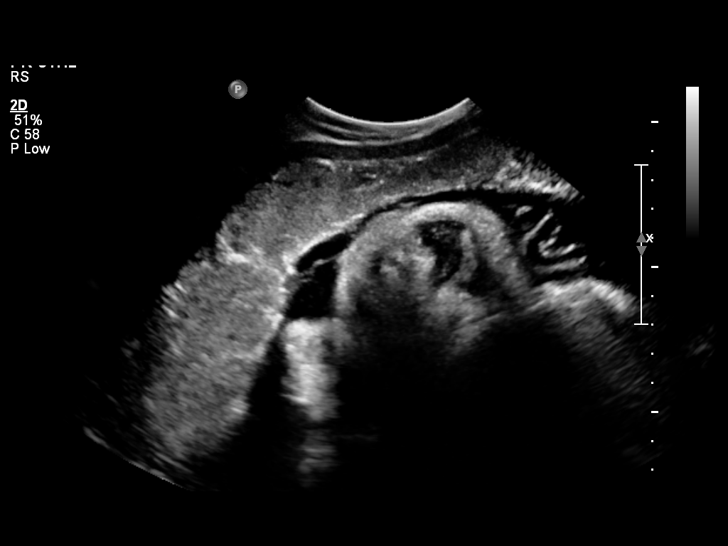
[im 7/24]
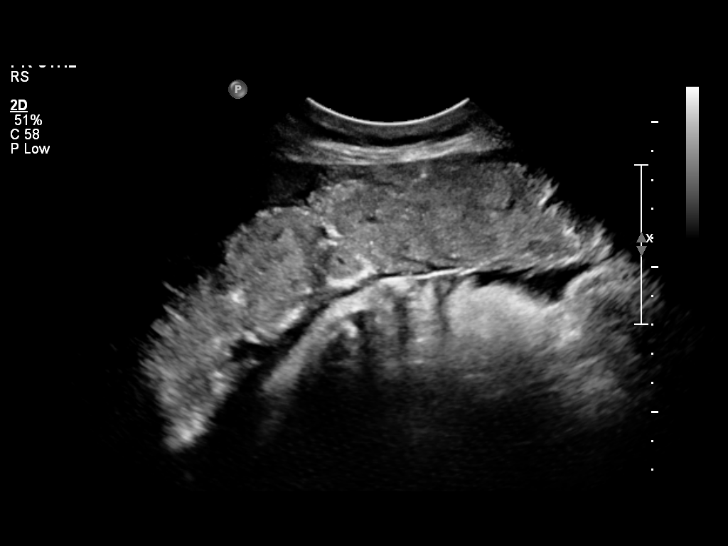
[im 9/24]
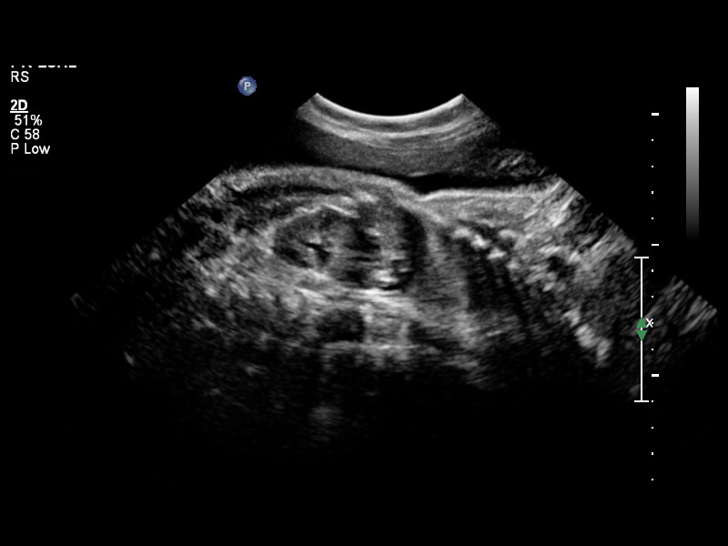
[im 11/24]
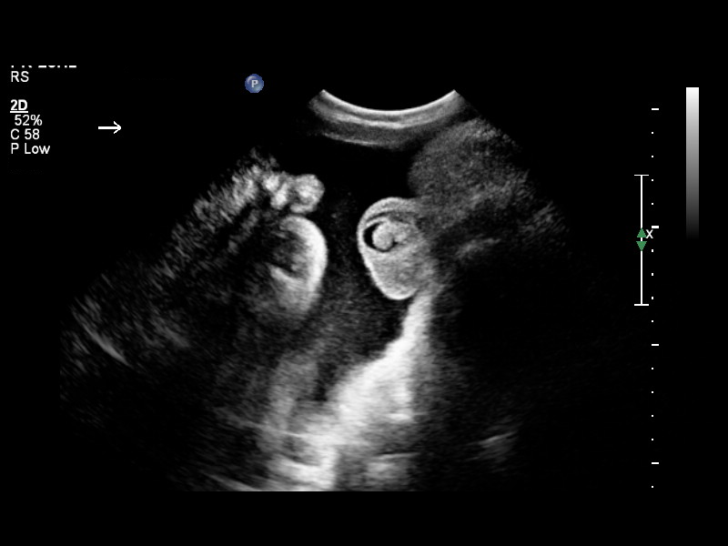
[im 13/24]
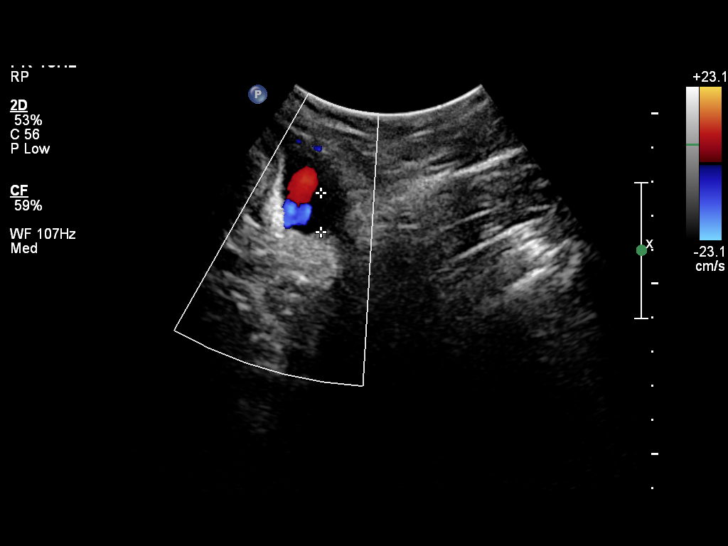
[im 14/24]
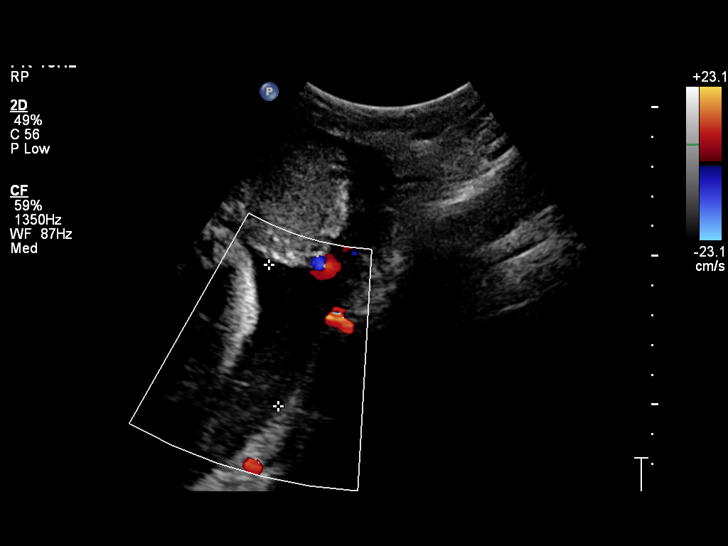
[im 16/24]
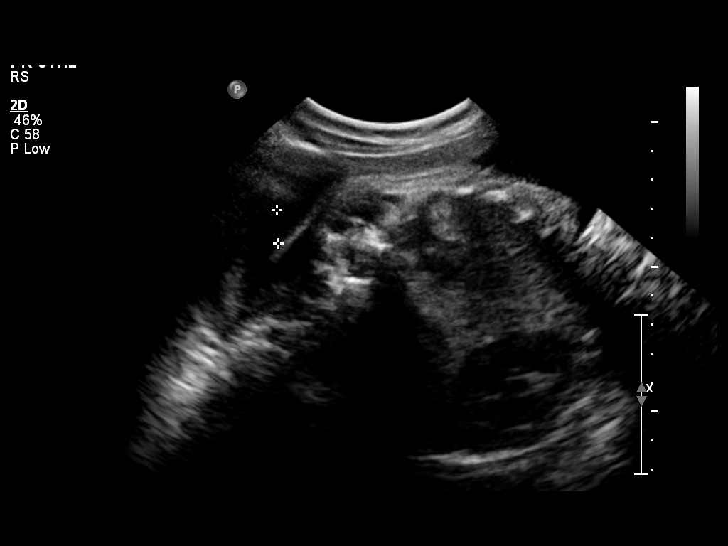
[im 18/24]
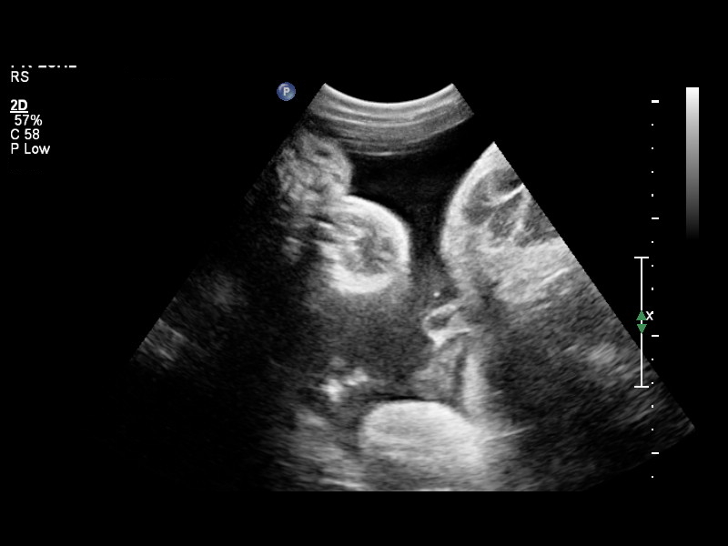
[im 20/24]
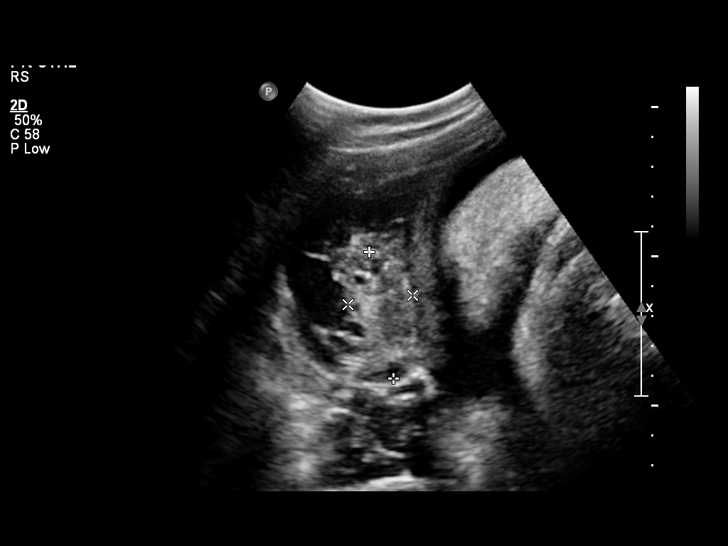
[im 22/24]
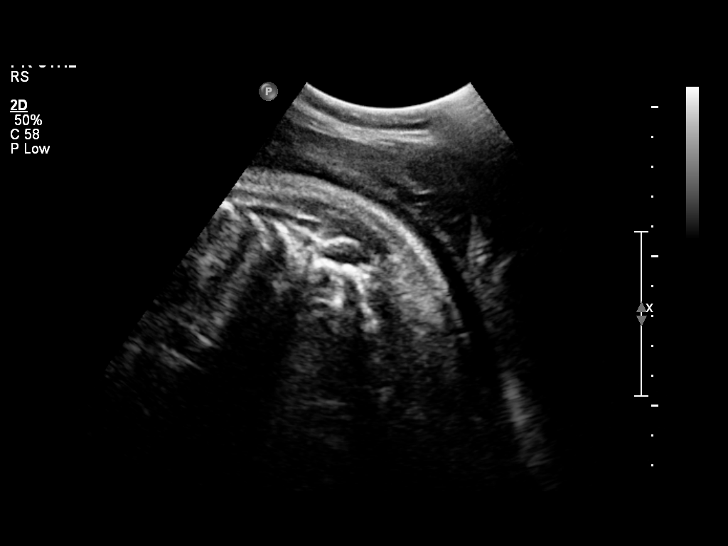
[im 24/24]
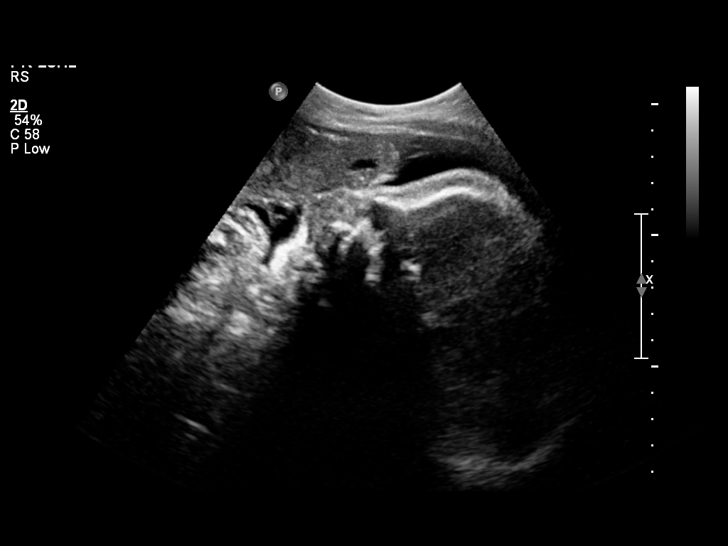

[13 of 24 positions shown; findings below may reference images not displayed]

OBSTETRICS REPORT
                      (Signed Final 03/30/2012 [DATE])

                 64_E
Procedures

 [HOSPITAL]                                         76815.0
Indications

 Non-reactive NST
 Assess fetal well being
 Assess amniotic fluid volume
Fetal Evaluation

 Fetal Heart Rate:  134                         bpm
 Cardiac Activity:  Observed
 Presentation:      Cephalic
 Placenta:          Anterior, above cervical os

 Amniotic Fluid
 AFI FV:      Subjectively within normal limits
 AFI Sum:     15.59   cm      67   %Tile     Larg Pckt:   8.55   cm
 RUQ:   8.55   cm    RLQ:    1.15   cm    LUQ:   1.14    cm   LLQ:    4.75   cm
Biophysical Evaluation

 Amniotic F.V:   Pocket => 2 cm two         F. Tone:        Observed
                 planes
 F. Movement:    Observed                   Score:          [DATE]
 F. Breathing:   Observed
Biometry

 BPD:     93.7  mm    G. Age:   38w 1d
Gestational Age

 LMP:           40w 1d       Date:   06/23/11                 EDD:   03/29/12
 U/S Today:     38w 1d                                        EDD:   04/12/12
 Best:          40w 1d    Det. By:   LMP  (06/23/11)          EDD:   03/29/12
Cervix Uterus Adnexa

 Cervix:       Not visualized (advanced GA >34 wks)
 Left Ovary:   Not visualized.
 Right Ovary:  Within normal limits.
 Adnexa:     No abnormality visualized.
Impression

 Single living intrauterine pregnancy in cephalic presentation.
 The estimated gestational age is 40w 1d based on LMP
 (06/23/11). The biophysical profile is [DATE].Amniotic fluid index
 is 15.59 cm.

 questions or concerns.

## 2014-04-23 MED ORDER — MORPHINE SULFATE 4 MG/ML IJ SOLN
4.0000 mg | Freq: Once | INTRAMUSCULAR | Status: AC
  Administered 2014-04-23: 4 mg via INTRAVENOUS
  Filled 2014-04-23: qty 1

## 2014-04-23 MED ORDER — NAPROXEN 500 MG PO TABS: 500.0000 mg | ORAL_TABLET | Freq: Two times a day (BID) | ORAL | Status: AC

## 2014-04-23 NOTE — Discharge Instructions (Signed)

## 2014-04-23 NOTE — ED Notes (Signed)
Pt requesting Morphine for pain; reports "I know I can take that and it doesn't make me feel bad"

## 2014-04-23 NOTE — ED Provider Notes (Signed)
Medical screening examination/treatment/procedure(s) were performed by non-physician practitioner and as supervising physician I was immediately available for consultation/collaboration.   EKG Interpretation None        Hoy Morn, MD 04/23/14 1428

## 2014-07-05 ENCOUNTER — Encounter (HOSPITAL_COMMUNITY): Payer: Self-pay | Admitting: Emergency Medicine

## 2014-11-09 ENCOUNTER — Emergency Department (HOSPITAL_COMMUNITY)
Admission: EM | Admit: 2014-11-09 | Discharge: 2014-11-09 | Disposition: A | Discharge status: Home / Self Care | Payer: BLUE CROSS/BLUE SHIELD | Source: Home / Self Care | Attending: Family Medicine | Admitting: Family Medicine

## 2014-11-09 ENCOUNTER — Encounter (HOSPITAL_COMMUNITY): Payer: Self-pay | Admitting: Emergency Medicine

## 2014-11-09 DIAGNOSIS — G932 Benign intracranial hypertension: Secondary | ICD-10-CM

## 2014-11-09 LAB — POCT I-STAT, CHEM 8
BUN: 6 mg/dL (ref 6–23)
CHLORIDE: 99 mmol/L (ref 96–112)
CREATININE: 0.8 mg/dL (ref 0.50–1.10)
Calcium, Ion: 1.15 mmol/L (ref 1.12–1.23)
GLUCOSE: 96 mg/dL (ref 70–99)
HCT: 39 % (ref 36.0–46.0)
HEMOGLOBIN: 13.3 g/dL (ref 12.0–15.0)
Potassium: 3.6 mmol/L (ref 3.5–5.1)
Sodium: 139 mmol/L (ref 135–145)
TCO2: 25 mmol/L (ref 0–100)

## 2014-11-09 LAB — POCT PREGNANCY, URINE: PREG TEST UR: NEGATIVE

## 2014-11-09 MED ORDER — METOCLOPRAMIDE HCL 5 MG/ML IJ SOLN
INTRAMUSCULAR | Status: AC
  Filled 2014-11-09: qty 2

## 2014-11-09 MED ORDER — METOCLOPRAMIDE HCL 5 MG/ML IJ SOLN
5.0000 mg | Freq: Once | INTRAMUSCULAR | Status: AC
  Administered 2014-11-09: 5 mg via INTRAMUSCULAR

## 2014-11-09 MED ORDER — DEXAMETHASONE SODIUM PHOSPHATE 10 MG/ML IJ SOLN
10.0000 mg | Freq: Once | INTRAMUSCULAR | Status: AC
  Administered 2014-11-09: 10 mg via INTRAMUSCULAR

## 2014-11-09 MED ORDER — KETOROLAC TROMETHAMINE 60 MG/2ML IM SOLN
60.0000 mg | Freq: Once | INTRAMUSCULAR | Status: AC
  Administered 2014-11-09: 60 mg via INTRAMUSCULAR

## 2014-11-09 MED ORDER — DEXAMETHASONE SODIUM PHOSPHATE 10 MG/ML IJ SOLN
INTRAMUSCULAR | Status: AC
  Filled 2014-11-09: qty 1

## 2014-11-09 MED ORDER — PROMETHAZINE HCL 25 MG PO TABS: 25.0000 mg | ORAL_TABLET | Freq: Four times a day (QID) | ORAL | Status: AC | PRN

## 2014-11-09 MED ORDER — ACETAZOLAMIDE 250 MG PO TABS: 250.0000 mg | ORAL_TABLET | Freq: Two times a day (BID) | ORAL | Status: DC

## 2014-11-09 MED ORDER — KETOROLAC TROMETHAMINE 60 MG/2ML IM SOLN
INTRAMUSCULAR | Status: AC
  Filled 2014-11-09: qty 2

## 2014-11-09 NOTE — ED Notes (Signed)
Attempted to discharge patient: patient asking questions about nausea medicine and "norco". Notified dr Georgina Snell

## 2014-11-09 NOTE — ED Notes (Addendum)
Patient reports documented history of intracranial hypertension and has been on medication.  Typical symptoms included: dizziness, headache,  facial numbness and tingling, and vomiting today.  Patient reports with migraines has weakness in right leg.

## 2014-11-09 NOTE — ED Notes (Signed)
Provided pillow and warm blankets

## 2014-11-09 NOTE — ED Provider Notes (Signed)
Meredith Howard is a 34 y.o. female who presents to Urgent Care today for headache. Patient has a history of idiopathic intracranial hypertension previously managed with acetazolamide. She stopped taking this medications 2 months ago when her insurance switched and she was no longer able to afford her neurologist. She notes worsening headaches. Over the past several weeks her headache has been especially bad. She has noted some tingly sensation in her hands and face and vomiting. This is consistent with previous symptoms that she had before she was started on medications.   Past Medical History  Diagnosis Date  . Neuromuscular disorder     fibromyalagia  . Back pain   . Gastritis   . Interstitial cystitis   . Endometrial cyst of ovary   . Anxiety     pt states she experiences panic attacks  . GERD (gastroesophageal reflux disease)   . Migraine   . Hernia, hiatal   . Fibroid   . Panic attack   . Panic attacks 01/09/2012  . Fibromyalgia   . Urinary tract infection   . Fibromyalgia   . PID (pelvic inflammatory disease)   . Trichomonas   . Depression     seeing a therapist  . Preterm labor     preterm contractions, delivered full term   Past Surgical History  Procedure Laterality Date  . Laparoscopy  06/18/2011    Procedure: LAPAROSCOPY DIAGNOSTIC;  Surgeon: Juliene Pina C. Hulan Fray, MD;  Location: [facility];  Service: Gynecology;  Laterality: Bilateral;  with bilateral chromopertubation with excision of endometrial biopsy  . Laparoscopic endometriosis fulguration  06/2011  . Cesarean section  04/06/2012    Procedure: CESAREAN SECTION;  Surgeon: Delice Lesch, MD;  Location: [place] [facility];  Service: Gynecology;  Laterality: N/A;  Primary cesarean section of baby boy  at 6    History  Substance Use Topics  . Smoking status: Never Smoker   . Smokeless tobacco: Never Used  . Alcohol Use: No   ROS as above Medications: No current facility-administered medications for this encounter.   Current  Outpatient Prescriptions  Medication Sig Dispense Refill  . acetaZOLAMIDE (DIAMOX) 250 MG tablet Take 1 tablet (250 mg total) by mouth 2 (two) times daily. 60 tablet 0  . amitriptyline (ELAVIL) 50 MG tablet Take 50 mg by mouth at bedtime.    . ferrous fumarate (HEMOCYTE - 106 MG FE) 325 (106 FE) MG TABS tablet Take 1 tablet by mouth daily.    . fluticasone (FLONASE) 50 MCG/ACT nasal spray Place 1 spray into both nostrils daily as needed for allergies or rhinitis.    . hyoscyamine (LEVSIN SL) 0.125 MG SL tablet Place 0.125 mg under the tongue every 4 (four) hours as needed for cramping.    Marland Kitchen levonorgestrel (MIRENA) 20 MCG/24HR IUD 1 each by Intrauterine route once.    . naproxen (NAPROSYN) 500 MG tablet Take 1 tablet (500 mg total) by mouth 2 (two) times daily. 30 tablet 0  . omeprazole (PRILOSEC) 20 MG capsule Take 20 mg by mouth daily.    . simethicone (MYLICON) 564 MG chewable tablet Chew 125 mg by mouth every 6 (six) hours as needed for flatulence.    . traMADol (ULTRAM) 50 MG tablet Take 50 mg by mouth daily as needed for moderate pain.     Allergies  Allergen Reactions  . Benadryl [Diphenhydramine] Other (See Comments)    Injection form: Restless, agitation  . Percocet [Oxycodone-Acetaminophen] Itching  . Sertraline Other (See Comments)  Dizzy, headache, felt light-headed     Exam:  BP 103/52 mmHg  Pulse 100  Temp(Src) 97.8 F (36.6 C) (Oral)  Resp 12  SpO2 99% Gen: Well NAD HEENT: EOMI,  MMM no papilledema bilateral fundus. PERRL. Lungs: Normal work of breathing. CTABL Heart: RRR no MRG Abd: NABS, Soft. Nondistended, Nontender Exts: Brisk capillary refill, warm and well perfused.  Neuro: Alert and oriented cranial nerves intact normal coordination gait strength and reflexes.  Patient was given 60 mg IM Toradol, 10 mg IM dexamethasone, and 5 mg IM Reglan.  Results for orders placed or performed during the hospital encounter of 11/09/14 (from the past 24 hour(s))   Pregnancy, urine POC     Status: None   Collection Time: 11/09/14  4:10 PM  Result Value Ref Range   Preg Test, Ur NEGATIVE NEGATIVE  I-STAT, chem 8     Status: None   Collection Time: 11/09/14  4:54 PM  Result Value Ref Range   Sodium 139 135 - 145 mmol/L   Potassium 3.6 3.5 - 5.1 mmol/L   Chloride 99 96 - 112 mmol/L   BUN 6 6 - 23 mg/dL   Creatinine, Ser 0.80 0.50 - 1.10 mg/dL   Glucose, Bld 96 70 - 99 mg/dL   Calcium, Ion 1.15 1.12 - 1.23 mmol/L   TCO2 25 0 - 100 mmol/L   Hemoglobin 13.3 12.0 - 15.0 g/dL   HCT 39.0 36.0 - 46.0 %   No results found.  Assessment and Plan: 34 y.o. female with headache secondary to idiopathic intracranial hypertension. Patient is off of her medications. Plan to restart acetazolamide. Recheck labs in 3 weeks. Refer to neurology. Return as needed. Headache medications given as above.  Discussed warning signs or symptoms. Please see discharge instructions. Patient expresses understanding.     Gregor Hams, MD 11/09/14 [phone]

## 2014-11-09 NOTE — Discharge Instructions (Signed)
Thank you for coming in today. Restart acetazolamide. Return or follow up with primary care provider in 3 weeks for lab recheck. Follow-up with neurology Go to the emergency room if your headache becomes excruciating or you have weakness or numbness or uncontrolled vomiting.  Use birth control taking acetazolamide. Use phenergan for nausea as needed starting at around 10 pm tonight.  Idiopathic Intracranial Hypertension Idiopathic intracranial hypertension (IIH) is a neurologic disorder that leads to increased pressure around your brain. It can cause vision loss and blindness if left untreated. RISK FACTORS IIH is most common in very overweight (obese) women of childbearing age. SIGNS AND SYMPTOMS  Symptoms of IIH include:  Headache.  Feeling of sickness in your stomach (nausea).  Vomiting.  A "rushing of water" sound within your ears (pulsatile tinnitus).  Double vision. DIAGNOSIS  Idiopathic intracranial hypertension is diagnosed with the aid of different exams:  Brain scans such as:  CT.  MRI.  MRV.  Diagnostic lumbar puncture. This procedure can determine if there is too much spinal fluid within the central nervous system. Too much spinal fluid can increase intracranial pressure.  A thorough eye exam will be done to look for swelling within the eyes. Visual field testing will also be done to see if any damage has occurred to nerves in the eyes. TREATMENT  Treatment of idiopathic intracranial hypertension is based on symptoms. Common treatments include:  Lumbar puncture to remove excess spinal fluid.  Medicine.  Surgery. HOME CARE INSTRUCTIONS The most important thing anyone can do to improve this condition is lose weight if they are overweight.  SEEK MEDICAL CARE IF:  You have changes in vision.  You have double vision.  You have loss of color vision. SEEK IMMEDIATE MEDICAL CARE IF:   Your headaches get worse rather than better.  Nausea or vomiting or  both continue after treatment.  Your vision does not improve or gets worse after treatment. MAKE SURE YOU:  Understand these instructions.  Will watch your condition.  Will get help right away if you are not doing well or get worse. Document Released: 10/29/2001 Document Revised: 08/25/2013 Document Reviewed: 04/27/2013 [facility] Patient Information 2015 [place], [place]. This information is not intended to replace advice given to you by your health care provider. Make sure you discuss any questions you have with your health care provider.

## 2014-11-12 MED ORDER — ACETAZOLAMIDE 250 MG PO TABS: 250.0000 mg | ORAL_TABLET | Freq: Two times a day (BID) | ORAL | Status: DC

## 2014-11-12 MED ORDER — ACETAZOLAMIDE 125 MG PO TABS: 125.0000 mg | ORAL_TABLET | Freq: Two times a day (BID) | ORAL | Status: DC

## 2014-11-12 NOTE — ED Provider Notes (Signed)
This patient's husband came in, he is requesting a prescription for 120 tablets of the acetazolamide rather than 60 because it will be less expensive. She has not started taking the acetazolomide yet. I discussed this with Dr. Bridgett Larsson. We will give her the prescription for 120 tablets. Also her dose was decreased to 125 mg rather than 250 because this is what she was on before. I told the husband if her headaches do not improve with the medication or definite they get worse, go to the emergency department. There are called her neurologist and she has an appointment in April.  Liam Graham, PA-C 11/12/14 1919

## 2014-12-06 ENCOUNTER — Ambulatory Visit (INDEPENDENT_AMBULATORY_CARE_PROVIDER_SITE_OTHER): Payer: BLUE CROSS/BLUE SHIELD | Admitting: Neurology

## 2014-12-06 ENCOUNTER — Encounter: Payer: Self-pay | Admitting: Neurology

## 2014-12-06 VITALS — BP 118/70 | HR 66 | Temp 98.7°F | Resp 16 | Ht 63.0 in | Wt 138.9 lb

## 2014-12-06 DIAGNOSIS — G43009 Migraine without aura, not intractable, without status migrainosus: Secondary | ICD-10-CM

## 2014-12-06 DIAGNOSIS — G932 Benign intracranial hypertension: Secondary | ICD-10-CM

## 2014-12-06 DIAGNOSIS — G950 Syringomyelia and syringobulbia: Secondary | ICD-10-CM | POA: Diagnosis not present

## 2014-12-06 MED ORDER — ACETAZOLAMIDE ER 500 MG PO CP12: 500.0000 mg | ORAL_CAPSULE | Freq: Two times a day (BID) | ORAL | Status: DC

## 2014-12-06 NOTE — Progress Notes (Signed)
NEUROLOGY CONSULTATION NOTE  Meredith Howard MRN: [id number] DOB: [birth year]  Referring provider: Lynne Leader, MD (Urgent Care) Primary care provider: none  Reason for consult:  Idiopathic intracranial hypertension  HISTORY OF PRESENT ILLNESS: Meredith Howard is a 34 year old right-handed woman with idiopathic intracranial hypertension, fibromyalgia, migraine, depression and panic attacks who presents for idiopathic intracranial hypertension.  ED records, labs and CT of head from August reviewed.  For many years, she suffered from chronic headache, which were diagnosed as migraines.  Location varies, as it may be frontal, temporal or in back of head and neck.  It is not positional.  It is pressure-like or throbbing.  It is associated with lightheadedness, especially when she stands up.  She also reports occasional visual obscurations, pulsatile tinnitus and classic tinnitus.  She also occasionally has right sided numbness involving the face and leg, with associated leg heaviness.  She failed multiple medications, such as amitriptyline, gabapentin and topamax.  She was diagnosed with idiopathic intracranial hypertension after a lumbar puncture reportedly showed elevated opening pressure.  She had a fall and hurt her upper back, which caused pain.  Due to the pain and right sided numbness of extremities, she had MRI of the cervical and thoracic spines performed in September.  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  She was being followed by a neurologist at [facility].  She was taking acetazolamide until January, when her insurance changed and she was unable to afford seeing her neurologist.  She developed increase in headaches as well as lightheadedness.  She presented to urgent care last month, where she was restarted on acetazolamide at 125mg  twice daily.  She reports weight  loss since restarting the acetazolamide.  Headaches have improved,  She gets them once a month and takes Excedrin.  A CT of the head from 04/22/14 was reviewed and was normal.  PAST MEDICAL HISTORY: Past Medical History  Diagnosis Date  . Neuromuscular disorder     fibromyalagia  . Back pain   . Gastritis   . Interstitial cystitis   . Endometrial cyst of ovary   . Anxiety     pt states she experiences panic attacks  . GERD (gastroesophageal reflux disease)   . Migraine   . Hernia, hiatal   . Fibroid   . Panic attack   . Panic attacks 01/09/2012  . Fibromyalgia   . Urinary tract infection   . Fibromyalgia   . PID (pelvic inflammatory disease)   . Trichomonas   . Depression     seeing a therapist  . Preterm labor     preterm contractions, delivered full term    PAST SURGICAL HISTORY: Past Surgical History  Procedure Laterality Date  . Laparoscopy  06/18/2011    Procedure: LAPAROSCOPY DIAGNOSTIC;  Surgeon: Juliene Pina C. Hulan Fray, MD;  Location: [facility];  Service: Gynecology;  Laterality: Bilateral;  with bilateral chromopertubation with excision of endometrial biopsy  . Laparoscopic endometriosis fulguration  06/2011  . Cesarean section  04/06/2012    Procedure: CESAREAN SECTION;  Surgeon: Delice Lesch, MD;  Location: [facility];  Service: Gynecology;  Laterality: N/A;  Primary cesarean section of baby boy  at 103     MEDICATIONS: Current Outpatient Prescriptions on File Prior to Visit  Medication Sig Dispense Refill  . omeprazole (PRILOSEC) 20 MG capsule Take 20 mg by mouth daily.    Marland Kitchen  promethazine (PHENERGAN) 25 MG tablet Take 1 tablet (25 mg total) by mouth every 6 (six) hours as needed for nausea or vomiting. 30 tablet 0  . traMADol (ULTRAM) 50 MG tablet Take 50 mg by mouth daily as needed for moderate pain.    Marland Kitchen amitriptyline (ELAVIL) 50 MG tablet Take 50 mg by mouth at bedtime.    . ferrous fumarate (HEMOCYTE - 106 MG FE) 325 (106 FE) MG TABS tablet Take 1 tablet by mouth  daily.    . fluticasone (FLONASE) 50 MCG/ACT nasal spray Place 1 spray into both nostrils daily as needed for allergies or rhinitis.    . hyoscyamine (LEVSIN SL) 0.125 MG SL tablet Place 0.125 mg under the tongue every 4 (four) hours as needed for cramping.    Marland Kitchen levonorgestrel (MIRENA) 20 MCG/24HR IUD 1 each by Intrauterine route once.    . naproxen (NAPROSYN) 500 MG tablet Take 1 tablet (500 mg total) by mouth 2 (two) times daily. (Patient not taking: Reported on 12/06/2014) 30 tablet 0  . simethicone (MYLICON) 371 MG chewable tablet Chew 125 mg by mouth every 6 (six) hours as needed for flatulence.     No current facility-administered medications on file prior to visit.    ALLERGIES: Allergies  Allergen Reactions  . Benadryl [Diphenhydramine] Other (See Comments)    Injection form: Restless, agitation  . Percocet [Oxycodone-Acetaminophen] Itching  . Sertraline Other (See Comments)    Dizzy, headache, felt light-headed    FAMILY HISTORY: Family History  Problem Relation Age of Onset  . Fibroids Mother   . Hypertension Mother   . Hypertension Father   . Diabetes Father   . Kidney disease Father     kidney stones  . Gout Father   . Cancer Father     prostate  . Anesthesia problems Neg Hx   . Other Neg Hx   . Migraines Mother   . Stroke Mother     SOCIAL HISTORY: History   Social History  . Marital Status: Married    Spouse Name: N/A  . Number of Children: N/A  . Years of Education: N/A   Occupational History  . Not on file.   Social History Main Topics  . Smoking status: Never Smoker   . Smokeless tobacco: Never Used  . Alcohol Use: No  . Drug Use: No  . Sexual Activity:    Partners: Male    Birth Control/ Protection:    Other Topics Concern  . Not on file   Social History Narrative    REVIEW OF SYSTEMS: Constitutional: No fevers, chills, or sweats, no generalized fatigue, change in appetite Eyes: No visual changes, double vision, eye pain Ear, nose  and throat: No hearing loss, ear pain, nasal congestion, sore throat Cardiovascular: No chest pain, palpitations Respiratory:  No shortness of breath at rest or with exertion, wheezes GastrointestinaI: No nausea, vomiting, diarrhea, abdominal pain, fecal incontinence Genitourinary:  No dysuria, urinary retention or frequency Musculoskeletal:  No neck pain, back pain Integumentary: No rash, pruritus, skin lesions Neurological: as above Psychiatric: No depression, insomnia, anxiety Endocrine: No palpitations, fatigue, diaphoresis, mood swings, change in appetite, change in weight, increased thirst Hematologic/Lymphatic:  No anemia, purpura, petechiae. Allergic/Immunologic: no itchy/runny eyes, nasal congestion, recent allergic reactions, rashes  PHYSICAL EXAM: Filed Vitals:   12/06/14 0818  BP: 118/70  Pulse: 66  Temp: 98.7 F (37.1 C)  Resp: 16   General: No acute distress Head:  Normocephalic/atraumatic Eyes:  Unable to visualize fundi on  exam. Neck: supple, no paraspinal tenderness, full range of motion Back: No paraspinal tenderness Heart: regular rate and rhythm Lungs: Clear to auscultation bilaterally. Vascular: No carotid bruits. Neurological Exam: Mental status: alert and oriented to person, place, and time, recent and remote memory intact, fund of knowledge intact, attention and concentration intact, speech fluent and not dysarthric, language intact. Cranial nerves: CN I: not tested CN II: pupils equal, round and reactive to light, visual fields intact CN V: facial sensation intact CN VII: upper and lower face symmetric CN VIII: hearing intact CN IX, X: gag intact, uvula midline CN XI: sternocleidomastoid and trapezius muscles intact CN XII: tongue midline Bulk & Tone: normal, no fasciculations. Motor:  5/5 throughout Sensation:  Pinprick and vibration intact Deep Tendon Reflexes:  2+ throughout, toes downgoing Finger to nose testing:  No dysmetria Heel to shin:   intact Gait:  Normal station and stride.  Able to turn and walk in tandem. Romberg negative.  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  It appears to low to be attributed to the mild cord compression in the cervical spine.  PLAN: 1.  Will titrate her acetazolamide up to 250mg  twice daily and then ER 500mg  twice daily 2.  Will get notes from prior neurologist.  If MRV not performed, will likely want to get that 3.  Must need another eye exam 4.  Ideally, I would like her to follow up with me, however she tells me she will be moving to [place] in the next month.  I advised her to get a neurologist ASAP.  Thank you for allowing me to take part in the care of this patient.  Metta Clines, DO

## 2014-12-06 NOTE — Patient Instructions (Signed)
1.  Finish the acetazolamide 125mg  tablets by taking 2 tablets twice daily.  When you are finished, start the 500mg  ER tablets twice daily. 2.  I will get notes from your other neurologist and let you know if anything else needs to be tested 3.  Needs formal eye exam 4.  Will need to establish care with a neurologist in [place] asap.

## 2014-12-09 ENCOUNTER — Telehealth: Payer: Self-pay | Admitting: Neurology

## 2014-12-09 ENCOUNTER — Telehealth: Payer: Self-pay | Admitting: *Deleted

## 2014-12-09 NOTE — Telephone Encounter (Signed)
Patient is aware that she has an appt with Dr Marcell Anger 01/03/15 1:45pm notes were sent

## 2014-12-09 NOTE — Telephone Encounter (Signed)
Pt wants to know the status of being referred to a eye dr please call her at [phone]

## 2014-12-09 NOTE — Telephone Encounter (Signed)
Patient states she cant make the eye appointment scheduled for May 2 she will be moving at the end of the month and need something earlier Call back number [phone]

## 2014-12-10 NOTE — Telephone Encounter (Signed)
Left message for patient to call back I am not able to get a earlier appt for eye exam

## 2014-12-21 ENCOUNTER — Telehealth: Payer: Self-pay | Admitting: *Deleted

## 2014-12-21 ENCOUNTER — Other Ambulatory Visit: Payer: Self-pay | Admitting: *Deleted

## 2014-12-21 DIAGNOSIS — G932 Benign intracranial hypertension: Secondary | ICD-10-CM

## 2014-12-21 NOTE — Telephone Encounter (Signed)
Patient is aware that she is scheduled for a MRV of head on 12/28/14 at [facility]  @ 6:45pm

## 2014-12-24 ENCOUNTER — Telehealth: Payer: Self-pay | Admitting: *Deleted

## 2014-12-24 NOTE — Telephone Encounter (Signed)
See below message and advise.

## 2014-12-24 NOTE — Telephone Encounter (Signed)
Patient did not get her MRI scheduled and she is also moving Friday and would like to know if you have and recommendations for neuro in [place] Call back [phone]

## 2014-12-24 NOTE — Telephone Encounter (Signed)
Unfortunately, I am not familiar with the neurologists in [place].  If she establishes care with a PCP, they would likely refer her to somebody they feel is reputable.

## 2014-12-27 ENCOUNTER — Telehealth: Payer: Self-pay | Admitting: Neurology

## 2014-12-27 NOTE — Telephone Encounter (Signed)
Patient meds were sent in by Dr Tomi Likens she went to wrong pharmacy

## 2014-12-27 NOTE — Telephone Encounter (Signed)
Called  X2 no answer. 

## 2014-12-27 NOTE — Telephone Encounter (Signed)
Pt left message on voice mail and needs to talk to someone aabout a medication that needs to be called in i could not understand the name of teh medication please call [phone]

## 2014-12-28 ENCOUNTER — Ambulatory Visit (HOSPITAL_COMMUNITY): Payer: BLUE CROSS/BLUE SHIELD

## 2014-12-29 ENCOUNTER — Telehealth: Payer: Self-pay | Admitting: *Deleted

## 2014-12-29 NOTE — Telephone Encounter (Signed)
Patient can not afford the medication (?) she would like something else sent in Call back [phone] Patient would like this sent to [facility] [place]

## 2014-12-30 ENCOUNTER — Other Ambulatory Visit: Payer: Self-pay | Admitting: *Deleted

## 2014-12-30 NOTE — Telephone Encounter (Signed)
Patient is aware of         Unfortunately, the other medications involve slow titration (such as with Topamax) or having to closely follow up labs (such as with Lasix). Since she is moving this week, I don't feel comfortable starting these medications. I would recommend that she find a neurologist as soon as possible in the [place] area.

## 2014-12-30 NOTE — Telephone Encounter (Signed)
Please advise 

## 2014-12-30 NOTE — Telephone Encounter (Signed)
Unfortunately, the other medications involve slow titration (such as with Topamax) or having to closely follow up labs (such as with Lasix).  Since she is moving this week, I don't feel comfortable starting these medications.  I would recommend that she find a neurologist as soon as possible in the [place] area.

## 2015-01-14 ENCOUNTER — Telehealth: Payer: Self-pay | Admitting: *Deleted

## 2015-01-14 NOTE — Telephone Encounter (Signed)
Patient requesting a refill on her Diamox however she would like to go back to the 250mg  and would like this sent to the [facility] in [place]   call back number  [phone]

## 2015-01-14 NOTE — Telephone Encounter (Signed)
We an prescribe her acetazolamide 250mg  twice daily, 30 day supply with 1 refill.

## 2015-01-14 NOTE — Telephone Encounter (Signed)
Please advise 

## 2015-01-14 NOTE — Telephone Encounter (Signed)
I spoke with patient she will have the RX transferred to the pharmacy in [place] she was not aware that she had refills

## 2015-01-19 ENCOUNTER — Telehealth: Payer: Self-pay | Admitting: Neurology

## 2015-01-19 NOTE — Telephone Encounter (Signed)
Returned call to [phone] unable to leave voicemail per recording it was full

## 2015-01-19 NOTE — Telephone Encounter (Signed)
Pt needs to talk to someone about medication dosage change please call [phone]

## 2015-01-21 ENCOUNTER — Telehealth: Payer: Self-pay | Admitting: *Deleted

## 2015-01-21 MED ORDER — ACETAZOLAMIDE 250 MG PO TABS: 250.0000 mg | ORAL_TABLET | Freq: Two times a day (BID) | ORAL | Status: AC

## 2015-01-21 NOTE — Addendum Note (Signed)
Addended by: Thurmon Fair on: 01/21/2015 04:28 PM   Modules accepted: Orders

## 2015-01-21 NOTE — Telephone Encounter (Signed)
Rx called into [facility] [place], [place]  [phone]

## 2015-01-21 NOTE — Telephone Encounter (Signed)
She is requesting an Rx for the Diamox 250mg  to be called in to pharmacy in [place]. She states she will run out of medicine before she can get in to see her new Neurologist next week. She doesn't have any refills left on the 250 mg to transfer Rx from her old pharmacy to her new one. She states she the 500 mg dose is too expensive & it caused her to have side effects. She only has discount card for the 250 mg.

## 2015-01-21 NOTE — Telephone Encounter (Signed)
Patient calling again about medication change. She needs this sent to the pharmacy. Patient states she spoke with susie earlier this week and that pharmacy also sent over a refill request. Acetazolomide

## 2015-01-21 NOTE — Telephone Encounter (Signed)
I authorized Diamox 250mg  twice daily for 30 days with 1 refill.

## 2017-08-16 ENCOUNTER — Encounter (HOSPITAL_COMMUNITY): Payer: Self-pay
